# Patient Record
Sex: Female | Born: 1940 | Race: White | Hispanic: No | Marital: Married | Smoking: Former smoker
Health system: Southern US, Community
[De-identification: ages and names within clinical notes are randomized; demographics above are authoritative.]

## PROBLEM LIST (undated history)

## (undated) DIAGNOSIS — I10 Essential (primary) hypertension: Secondary | ICD-10-CM

## (undated) DIAGNOSIS — E785 Hyperlipidemia, unspecified: Secondary | ICD-10-CM

## (undated) HISTORY — DX: Essential (primary) hypertension: I10

## (undated) HISTORY — PX: EYE SURGERY: SHX253

## (undated) HISTORY — DX: Hyperlipidemia, unspecified: E78.5

---
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
## 1982-07-16 HISTORY — PX: TUBAL LIGATION: SHX77

## 1983-07-17 HISTORY — PX: TUBAL LIGATION: SHX77

## 1984-07-16 HISTORY — PX: BREAST SURGERY: SHX581

## 1986-07-16 HISTORY — PX: BREAST EXCISIONAL BIOPSY: SUR124

## 1998-02-02 ENCOUNTER — Ambulatory Visit (HOSPITAL_COMMUNITY): Admission: RE | Admit: 1998-02-02 | Discharge: 1998-02-02 | Payer: Self-pay | Admitting: Obstetrics and Gynecology

## 1998-02-14 ENCOUNTER — Other Ambulatory Visit: Admission: RE | Admit: 1998-02-14 | Discharge: 1998-02-14 | Payer: Self-pay | Admitting: Obstetrics and Gynecology

## 1999-04-25 ENCOUNTER — Other Ambulatory Visit: Admission: RE | Admit: 1999-04-25 | Discharge: 1999-04-25 | Payer: Self-pay | Admitting: *Deleted

## 1999-08-08 ENCOUNTER — Encounter: Payer: Self-pay | Admitting: Obstetrics and Gynecology

## 1999-08-08 ENCOUNTER — Ambulatory Visit (HOSPITAL_COMMUNITY): Admission: RE | Admit: 1999-08-08 | Discharge: 1999-08-08 | Payer: Self-pay | Admitting: Obstetrics and Gynecology

## 2000-04-25 ENCOUNTER — Other Ambulatory Visit: Admission: RE | Admit: 2000-04-25 | Discharge: 2000-04-25 | Payer: Self-pay | Admitting: Obstetrics and Gynecology

## 2000-09-09 ENCOUNTER — Ambulatory Visit (HOSPITAL_COMMUNITY): Admission: RE | Admit: 2000-09-09 | Discharge: 2000-09-09 | Payer: Self-pay | Admitting: Obstetrics and Gynecology

## 2000-09-09 ENCOUNTER — Encounter: Payer: Self-pay | Admitting: Obstetrics and Gynecology

## 2000-11-04 ENCOUNTER — Other Ambulatory Visit: Admission: RE | Admit: 2000-11-04 | Discharge: 2000-11-04 | Payer: Self-pay | Admitting: Obstetrics and Gynecology

## 2001-04-28 ENCOUNTER — Other Ambulatory Visit: Admission: RE | Admit: 2001-04-28 | Discharge: 2001-04-28 | Payer: Self-pay | Admitting: Obstetrics and Gynecology

## 2002-02-12 ENCOUNTER — Encounter: Payer: Self-pay | Admitting: Obstetrics and Gynecology

## 2002-02-12 ENCOUNTER — Ambulatory Visit (HOSPITAL_COMMUNITY): Admission: RE | Admit: 2002-02-12 | Discharge: 2002-02-12 | Payer: Self-pay | Admitting: Obstetrics and Gynecology

## 2002-02-18 ENCOUNTER — Encounter: Admission: RE | Admit: 2002-02-18 | Discharge: 2002-02-18 | Payer: Self-pay | Admitting: Obstetrics and Gynecology

## 2002-02-18 ENCOUNTER — Encounter: Payer: Self-pay | Admitting: Obstetrics and Gynecology

## 2002-06-19 ENCOUNTER — Other Ambulatory Visit: Admission: RE | Admit: 2002-06-19 | Discharge: 2002-06-19 | Payer: Self-pay | Admitting: Obstetrics and Gynecology

## 2002-10-29 ENCOUNTER — Encounter: Admission: RE | Admit: 2002-10-29 | Discharge: 2002-10-29 | Payer: Self-pay | Admitting: Obstetrics and Gynecology

## 2002-10-29 ENCOUNTER — Encounter: Payer: Self-pay | Admitting: Obstetrics and Gynecology

## 2003-06-21 ENCOUNTER — Other Ambulatory Visit: Admission: RE | Admit: 2003-06-21 | Discharge: 2003-06-21 | Payer: Self-pay | Admitting: Obstetrics and Gynecology

## 2004-05-03 ENCOUNTER — Encounter: Admission: RE | Admit: 2004-05-03 | Discharge: 2004-05-03 | Payer: Self-pay | Admitting: Obstetrics and Gynecology

## 2004-06-21 ENCOUNTER — Other Ambulatory Visit: Admission: RE | Admit: 2004-06-21 | Discharge: 2004-06-21 | Payer: Self-pay | Admitting: *Deleted

## 2005-07-18 ENCOUNTER — Encounter: Admission: RE | Admit: 2005-07-18 | Discharge: 2005-07-18 | Payer: Self-pay | Admitting: Obstetrics and Gynecology

## 2005-07-23 ENCOUNTER — Other Ambulatory Visit: Admission: RE | Admit: 2005-07-23 | Discharge: 2005-07-23 | Payer: Self-pay | Admitting: Obstetrics and Gynecology

## 2006-09-02 ENCOUNTER — Encounter: Admission: RE | Admit: 2006-09-02 | Discharge: 2006-09-02 | Payer: Self-pay | Admitting: Obstetrics and Gynecology

## 2006-09-17 ENCOUNTER — Encounter: Admission: RE | Admit: 2006-09-17 | Discharge: 2006-09-17 | Payer: Self-pay | Admitting: Obstetrics and Gynecology

## 2006-12-26 ENCOUNTER — Other Ambulatory Visit: Admission: RE | Admit: 2006-12-26 | Discharge: 2006-12-26 | Payer: Self-pay | Admitting: Obstetrics and Gynecology

## 2010-03-27 LAB — HM MAMMOGRAPHY: HM Mammogram: NORMAL

## 2010-08-04 ENCOUNTER — Encounter
Admission: RE | Admit: 2010-08-04 | Discharge: 2010-08-04 | Payer: Self-pay | Source: Home / Self Care | Attending: Obstetrics and Gynecology | Admitting: Obstetrics and Gynecology

## 2010-08-06 ENCOUNTER — Encounter: Payer: Self-pay | Admitting: Obstetrics and Gynecology

## 2011-01-14 HISTORY — PX: FRACTURE SURGERY: SHX138

## 2011-01-27 ENCOUNTER — Emergency Department (HOSPITAL_COMMUNITY): Payer: Medicare Other

## 2011-01-27 ENCOUNTER — Inpatient Hospital Stay (HOSPITAL_COMMUNITY)
Admission: EM | Admit: 2011-01-27 | Discharge: 2011-02-02 | DRG: 494 | Disposition: A | Discharge status: Home Health | Payer: Medicare Other | Attending: Orthopaedic Surgery | Admitting: Orthopaedic Surgery

## 2011-01-27 DIAGNOSIS — K219 Gastro-esophageal reflux disease without esophagitis: Secondary | ICD-10-CM | POA: Diagnosis present

## 2011-01-27 DIAGNOSIS — S82853A Displaced trimalleolar fracture of unspecified lower leg, initial encounter for closed fracture: Principal | ICD-10-CM | POA: Diagnosis present

## 2011-01-27 DIAGNOSIS — W010XXA Fall on same level from slipping, tripping and stumbling without subsequent striking against object, initial encounter: Secondary | ICD-10-CM | POA: Diagnosis present

## 2011-01-27 DIAGNOSIS — Y998 Other external cause status: Secondary | ICD-10-CM

## 2011-01-28 ENCOUNTER — Inpatient Hospital Stay (HOSPITAL_COMMUNITY): Payer: Medicare Other

## 2011-01-28 LAB — URINALYSIS, ROUTINE W REFLEX MICROSCOPIC
Ketones, ur: NEGATIVE mg/dL
Leukocytes, UA: NEGATIVE
Nitrite: NEGATIVE
Urobilinogen, UA: 0.2 mg/dL (ref 0.0–1.0)
pH: 7.5 (ref 5.0–8.0)

## 2011-01-28 LAB — BASIC METABOLIC PANEL
Chloride: 102 mEq/L (ref 96–112)
Creatinine, Ser: 0.56 mg/dL (ref 0.50–1.10)
GFR calc Af Amer: >60 mL/min (ref >60)
Potassium: 4 mEq/L (ref 3.5–5.1)

## 2011-01-28 LAB — CBC
MCV: 87.3 fL (ref 78.0–100.0)
Platelets: 258 10*3/uL (ref 150–400)
RDW: 12.4 % (ref 11.5–15.5)
WBC: 8.9 10*3/uL (ref 4.0–10.5)

## 2011-01-29 LAB — URINE CULTURE
Colony Count: NO GROWTH
Culture  Setup Time: 201207151715
Culture: NO GROWTH

## 2011-02-05 NOTE — Op Note (Signed)
  NAMEMADISSEN, Kara Harvey             ACCOUNT NO.:  [id number]  MEDICAL RECORD NO.:  [id number]  LOCATION:  5030                         FACILITY:  [facility]  PHYSICIAN:  Jasten Guyette C. Ophelia Harvey, M.D.    DATE OF BIRTH:  March 01, 1941  DATE OF PROCEDURE:  01/27/2011 DATE OF DISCHARGE:                              OPERATIVE REPORT   PREOPERATIVE DIAGNOSIS:  Left closed trimalleolar ankle fracture.  POSTOPERATIVE DIAGNOSIS:  Left closed trimalleolar ankle fracture.  PROCEDURE:  Bimalleolar fixation of left trimalleolar ankle fracture with fixation of medial lateral malleolus.  SURGEON:  Erminia Mcnew C. Ophelia Charter, MD  ANESTHESIA:  GOT.  TOURNIQUET TIME:  Less than an hour.  ESTIMATED BLOOD LOSS:  Minimal.  DESCRIPTION OF PROCEDURE:  After induction of general anesthesia, preoperative Ancef prophylaxis, standard prepping and draping, sterile skin marker, Betadine, Vi-Drape, proximal tourniquet, extremity sheets and drapes, stockinette, and a sterile glove over the toes.  Surgical time-out procedure was completed.  Leg was wrapped with an Esmarch, leg elevated, tourniquet inflated.  Medial incision was made first. Incision had to be altered slightly due to an abrasion.  Medially over the medial malleolus, she had some superficial varicosities and thin skin.  Branches of the saphenous vein were preserved.  Fracture was distracted, irrigated.  Medial malleolar fragment was fairly small. Lateral incision was made, subperiosteal dissection down the fibula with reduction of the fibular fragments.  There was significant comminution and the portion of the fibula that corresponded to the third and fourth holes of the Synthes plate from the inferior aspect and an anterior and posterior butterfly fragment.  Distal piece was all in one piece, but bone was soft.  Unicortical screws were placed distally, cancellous 2 x 14-mm screws, and then 12 x 14-mm bicortical screws proximally.  Lag screw was placed from anterior  proximally to distal posterior to try to hold the 2 fragments.  They were still somewhat mobile despite the screw being placed, this was a fixation, but due to the poor quality of the bone, a finger could still be placed from anterior to posterior and the midportion was comminuted and squeezed with still some mobility. Overall alignment looked good.  Attention was turned to the medial aspect and two 35-mm cancellous screws were placed reducing the fragment after holding it reduced, drilling a K-wire, and then placing the 2 screws.  Final spot pictures showed anatomic reduction of the mortise. She will be nonweightbearing due to the comminution of the fracture.  On the lateral fluoroscopic views, the posterior malleolus had significant improvement in its position and involved less than a third of the joint, so no fixation of posterior malleolus was performed.  The patient tolerated the procedure well and was transferred to the recovery room in stable condition.  After irrigation, closed with 2-0 Vicryl, skin staple closure, Marcaine infiltration.  Xeroform 4 x 4s, Webril, and short-leg splint.     Kara Harvey, M.D.     MCY/MEDQ  D:  01/28/2011  T:  01/28/2011  Job:  [id number]  Electronically Signed by Annell Greening M.D. on 02/05/2011 04:10:57 PM

## 2011-03-05 NOTE — Discharge Summary (Signed)
[name], [name]             ACCOUNT NO.:  [id number]  MEDICAL RECORD NO.:  [id number]  LOCATION:  5030                         FACILITY:  [facility]  PHYSICIAN:  Sylvester Salonga C. Ophelia Charter, M.D.    DATE OF BIRTH:  1940-09-06  DATE OF ADMISSION:  01/27/2011 DATE OF DISCHARGE:  02/02/2011                              DISCHARGE SUMMARY   ADMISSION DIAGNOSIS:  Left closed trimalleolar ankle fracture.  DISCHARGE DIAGNOSIS:  Left closed trimalleolar ankle fracture.  PROCEDURE:  On January 27, 2011, the patient underwent bimalleolar fixation of left trimalleolar ankle fracture with fixation of medial and lateral malleolus.  This was performed by Dr. Ophelia Charter under general anesthesia.  CONSULTATIONS:  None.  BRIEF HISTORY:  The patient is a 70 year old female who slipped and fell injuring her left ankle on some wet steps.  She was brought to [facility] where radiographs showed a closed left trimalleolar ankle fracture.  She was admitted for the procedure as stated above.  BRIEF HOSPITAL COURSE:  The patient tolerated the procedure under general anesthesia without complications.  Postoperatively, she had good capillary refill and sensation of her toes.  She was placed at bedrest with strict elevation.  Physical Therapy was consulted for ambulation and gait training.  The patient was very slow to progress with her activity.  It was felt she may benefit from a skilled nursing facility, but gradually she was able to progress a little better and become ambulatory so that she could return home.  The patient did have an episode of elevated temperature as high as 102.3.  This was felt to be related atelectasis and she was treated with incentive spirometry. Urine culture as of January 29, 2011, was negative.  The patient was treated with PAS to the right lower extremity and aspirin for DVT prophylaxis.  She eventually was able to complete her physical therapy which involved ambulating up  and down steps as well and was discharged on February 02, 2011, in stable condition.  At that time, she was using p.o. analgesics.  She was taking a regular diet.  She was voiding and having bowel movements without difficulty.  PERTINENT LABORATORY VALUES:  On January 28, 2011, hemoglobin 12.7, hematocrit was 37.7.  Chemistry studies on admission with glucose 111, otherwise within normal limits.  PLAN:  The patient was discharged to her home.  Arrangement was made for home health physical therapy to assist with ambulation and gait training.  She is strictly nonweightbearing to the operative extremity. She is advised to continue strict elevation when she is at rest to decrease the amount of edema of her ankle.  She is encouraged to move her toes.  She will keep her splint dry and clean at all times.  The patient is instructed to follow up with Dr. Ophelia Charter 2 weeks from the date of surgery.  She was placed on aspirin for DVT prophylaxis and instructed to use a stool softener daily while using narcotic medications.  She was given prescription for Percocet 5/325 one to two every 4-6 hours as needed for pain.  She will continue all of her other home medications as taken prior to admission.  She is advised to call  the office if she has questions or concerns prior to her return office visit.  CONDITION ON DISCHARGE:  Stable.     Wende Neighbors, P.A.   ______________________________ Veverly Fells Ophelia Charter, M.D.    SMV/MEDQ  D:  02/27/2011  T:  02/27/2011  Job:  [id number]  Electronically Signed by Maud Deed P.A. on 02/28/2011 01:02:24 PM Electronically Signed by Annell Greening M.D. on 03/05/2011 04:33:11 PM

## 2013-02-09 ENCOUNTER — Telehealth: Payer: Self-pay | Admitting: Emergency Medicine

## 2013-02-09 NOTE — Telephone Encounter (Signed)
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

## 2013-02-09 NOTE — Telephone Encounter (Signed)
Patient called stating she wanted to become a new patient to you. She stated she knew you personally and wanted to know if you would accept her into the practice.

## 2013-03-27 ENCOUNTER — Ambulatory Visit (INDEPENDENT_AMBULATORY_CARE_PROVIDER_SITE_OTHER): Payer: Medicare Other | Admitting: Internal Medicine

## 2013-03-27 ENCOUNTER — Encounter: Payer: Self-pay | Admitting: Internal Medicine

## 2013-03-27 VITALS — BP 118/72 | HR 64 | Temp 97.7°F | Resp 14 | Ht 63.0 in | Wt 167.5 lb

## 2013-03-27 DIAGNOSIS — Z1211 Encounter for screening for malignant neoplasm of colon: Secondary | ICD-10-CM

## 2013-03-27 DIAGNOSIS — I10 Essential (primary) hypertension: Secondary | ICD-10-CM

## 2013-03-27 DIAGNOSIS — N3941 Urge incontinence: Secondary | ICD-10-CM

## 2013-03-27 DIAGNOSIS — E785 Hyperlipidemia, unspecified: Secondary | ICD-10-CM

## 2013-03-27 DIAGNOSIS — Z23 Encounter for immunization: Secondary | ICD-10-CM

## 2013-03-27 DIAGNOSIS — Z1239 Encounter for other screening for malignant neoplasm of breast: Secondary | ICD-10-CM

## 2013-03-27 MED ORDER — TETANUS-DIPHTH-ACELL PERTUSSIS 5-2.5-18.5 LF-MCG/0.5 IM SUSP: 0.5000 mL | Freq: Once | INTRAMUSCULAR | Status: DC

## 2013-03-27 MED ORDER — ZOSTER VACCINE LIVE 19400 UNT/0.65ML ~~LOC~~ SOLR: 0.6500 mL | Freq: Once | SUBCUTANEOUS | Status: DC

## 2013-03-27 NOTE — Patient Instructions (Addendum)
Referral for colonoscopy pending   mammogram ordered ofr late morning /early afternoon  Make appt for early morning fasting labs and an annual physical with PAP smear

## 2013-03-27 NOTE — Progress Notes (Signed)
Patient ID: Kara Harvey, female   DOB: Dec 06, 1940, 72 y.o.   MRN: [id number]    Patient Active Problem List   Diagnosis Date Noted  . Urge incontinence 03/27/2013  . Essential hypertension, benign 03/27/2013  . Other and unspecified hyperlipidemia 03/27/2013    Subjective:  CC:   Chief Complaint  Patient presents with  . Establish Care    HPI:  Kara Harvey is a 72 y.o. female who presents as a new patient to establish primary care with the chief complaint of  Need for primary care,  She is a Former pt of Lacie Scotts, who shetolerated because he was was also her father's physician of many years , but her father passed in 2023-01-12 Father died at 46 in 01/12/2023 in hospice after suffering multiple strokes which left him aphasic.   1) Weight gain:  She Fractured her left ankleseveal years ago and undewent  fixed trimalleolar repair with plates and screws by Dr.  Ophelia Charter.  She has ained weight  Since she broke it bc she is not walking anymore,   2) HTN: she has not taken medications for hypertension for over a year.   3) Hyperlipidemia:  She has not taken statins for years either    4) HM:  No prior colonoscopy EVER.  Bowels move regularly,  No history of BRBPR and no FH of colon. CA  Backed out of colonoscopy by Buccini bc of the prep.    5) Needs pap SMEAR.    6) Sees Diona Browner for annual derm checkup.  No history of skin cancer.  She has had prior screening for osteoporosis.  DEXA scans done in [place]      Past Medical History  Diagnosis Date  . Hyperlipidemia   . Hypertension     Past Surgical History  Procedure Laterality Date  . Fracture surgery Left 01/14/2011    ankle  . Tonsilectomy, adenoidectomy, bilateral myringotomy and tubes Bilateral 1957    does not believe adenoids were removed  . Tubal ligation  y-37  . Breast surgery Left 1986    for false positive    Family History  Problem Relation Age of Onset  . Hyperlipidemia Mother   . Cancer Mother   .  Arthritis Father   . Alzheimer's disease Father     History   Social History  . Marital Status: Married    Spouse Name: N/A    Number of Children: N/A  . Years of Education: N/A   Occupational History  . Not on file.   Social History Main Topics  . Smoking status: Former Smoker -- 2.00 packs/day    Types: Cigarettes    Quit date: 03/28/1979  . Smokeless tobacco: Never Used  . Alcohol Use: 3.5 oz/week    7 drink(s) per week  . Drug Use: No  . Sexual Activity: Yes   Other Topics Concern  . Not on file   Social History Narrative  . No narrative on file    Allergies  Allergen Reactions  . Sulfa Antibiotics Rash      Review of Systems:   The remainder of the review of systems was negative except those addressed in the HPI.    Objective:  BP 118/72  Pulse 64  Temp(Src) 97.7 F (36.5 C) (Oral)  Resp 14  Ht 5\' 3"  (1.6 m)  Wt 167 lb 8 oz (75.978 kg)  BMI 29.68 kg/m2  SpO2 98%  General appearance: alert, cooperative and appears stated age Ears:  normal TM's and external ear canals both ears Throat: lips, mucosa, and tongue normal; teeth and gums normal Neck: no adenopathy, no carotid bruit, supple, symmetrical, trachea midline and thyroid not enlarged, symmetric, no tenderness/mass/nodules Back: symmetric, no curvature. ROM normal. No CVA tenderness. Lungs: clear to auscultation bilaterally Heart: regular rate and rhythm, S1, S2 normal, no murmur, click, rub or gallop Abdomen: soft, non-tender; bowel sounds normal; no masses,  no organomegaly Pulses: 2+ and symmetric Skin: Skin color, texture, turgor normal. No rashes or lesions Lymph nodes: Cervical, supraclavicular, and axillary nodes normal.  Assessment and Plan:  Essential hypertension, benign Currently not in need of medication .  Prior elevations apparently were brought on by family stressors.   Other and unspecified hyperlipidemia She was previously treated with crestor but has not taken  medication in over 2 years due to concerns about side effects.  Will return for fasting lipids.  She has no personal RFs for CAD currently other than post menopausal status.   Urge incontinence Mild. Chronic,  Recommended Kegel exercises.    Updated Medication List Outpatient Encounter Prescriptions as of 03/27/2013  Medication Sig Dispense Refill  . rosuvastatin (CRESTOR) 20 MG tablet Take 20 mg by mouth daily.      . TDaP (BOOSTRIX) 5-2.5-18.5 LF-MCG/0.5 injection Inject 0.5 mLs into the muscle once.  0.5 mL  0  . zoster vaccine live, PF, (ZOSTAVAX) 16109 UNT/0.65ML injection Inject 19,400 Units into the skin once.  1 each  0  . [DISCONTINUED] lisinopril (PRINIVIL,ZESTRIL) 10 MG tablet Take 10 mg by mouth daily.       No facility-administered encounter medications on file as of 03/27/2013.     Orders Placed This Encounter  Procedures  . HM MAMMOGRAPHY  . MM Digital Screening  . Flu Vaccine QUAD 36+ mos PF IM (Fluarix)  . Ambulatory referral to Gastroenterology    No Follow-up on file.

## 2013-03-29 ENCOUNTER — Encounter: Payer: Self-pay | Admitting: Internal Medicine

## 2013-03-29 NOTE — Assessment & Plan Note (Signed)
Mild. Chronic,  Recommended Kegel exercises.

## 2013-03-29 NOTE — Assessment & Plan Note (Signed)
Currently not in need of medication .  Prior elevations apparently were brought on by family stressors.

## 2013-03-29 NOTE — Assessment & Plan Note (Addendum)
She was previously treated with crestor but has not taken medication in over 2 years due to concerns about side effects.  Will return for fasting lipids.  She has no personal RFs for CAD currently other than post menopausal status.

## 2013-04-05 LAB — HM MAMMOGRAPHY: HM Mammogram: NORMAL

## 2013-04-22 ENCOUNTER — Ambulatory Visit
Admission: RE | Admit: 2013-04-22 | Discharge: 2013-04-22 | Disposition: A | Discharge status: Home / Self Care | Payer: Medicare Other | Source: Ambulatory Visit | Attending: Internal Medicine | Admitting: Internal Medicine

## 2013-04-22 DIAGNOSIS — Z1239 Encounter for other screening for malignant neoplasm of breast: Secondary | ICD-10-CM

## 2013-05-26 ENCOUNTER — Ambulatory Visit (AMBULATORY_SURGERY_CENTER): Payer: Medicare Other

## 2013-05-26 VITALS — Ht 63.0 in | Wt 170.0 lb

## 2013-05-26 DIAGNOSIS — Z1211 Encounter for screening for malignant neoplasm of colon: Secondary | ICD-10-CM

## 2013-05-26 MED ORDER — MOVIPREP 100 G PO SOLR: ORAL | Status: DC

## 2013-05-29 ENCOUNTER — Encounter: Payer: Self-pay | Admitting: Internal Medicine

## 2013-06-05 LAB — HM COLONOSCOPY: HM Colonoscopy: NORMAL

## 2013-06-09 ENCOUNTER — Ambulatory Visit (AMBULATORY_SURGERY_CENTER): Payer: Medicare Other | Admitting: Internal Medicine

## 2013-06-09 ENCOUNTER — Encounter: Payer: Self-pay | Admitting: Internal Medicine

## 2013-06-09 VITALS — BP 151/72 | HR 81 | Temp 96.7°F | Resp 12 | Ht 63.0 in | Wt 170.0 lb

## 2013-06-09 DIAGNOSIS — Z1211 Encounter for screening for malignant neoplasm of colon: Secondary | ICD-10-CM

## 2013-06-09 DIAGNOSIS — D126 Benign neoplasm of colon, unspecified: Secondary | ICD-10-CM

## 2013-06-09 MED ORDER — SODIUM CHLORIDE 0.9 % IV SOLN: 500.0000 mL | INTRAVENOUS | Status: DC

## 2013-06-09 NOTE — Op Note (Signed)
[facility] [address] [place], [address]   COLONOSCOPY PROCEDURE REPORT  PATIENT: Kara, Harvey  MR#: [id number] BIRTHDATE: 06/13/1941 , 72  yrs. old GENDER: Female ENDOSCOPIST: Beverley Fiedler, MD REFERRED WN:[id number] Darrick Huntsman, M.D. PROCEDURE DATE:  06/09/2013 PROCEDURE:   Colonoscopy with cold biopsy polypectomy and Colonoscopy, incomplete First Screening Colonoscopy - Avg.  risk and is 50 yrs.  old or older Yes.  Prior Negative Screening - Now for repeat screening. N/A  History of Adenoma - Now for follow-up colonoscopy & has been > or = to 3 yrs.  N/A  Polyps Removed Today? Yes. ASA CLASS:   Class II INDICATIONS:average risk screening and first colonoscopy. MEDICATIONS: MAC sedation, administered by CRNA and propofol (Diprivan) 300mg  IV  DESCRIPTION OF PROCEDURE:   After the risks benefits and alternatives of the procedure were thoroughly explained, informed consent was obtained.  A digital rectal exam revealed no rectal mass.   The LB PFC-H190 [id number]  endoscope was introduced through the anus and advanced to the sigmoid colon. No adverse events experienced.   The quality of the prep was Moviprep fair  The instrument was then slowly withdrawn as the colon was fully examined.   COLON FINDINGS: There was severe diverticulosis noted in the sigmoid colon with associated tortuosity, angulation and muscular hypertrophy.  Despite the use of the pediatric colonoscope, manual pressure and position changes the scope could not be advanced through the sigmoid.  A sessile polyp measuring 5 mm in size was found in the sigmoid colon.  A polypectomy was performed with a cold snare.  The resection was complete and the polyp tissue was not retrieved.  Retroflexed views revealed no abnormalities. The scope was withdrawn and the procedure completed. COMPLICATIONS: There were no complications.  ENDOSCOPIC IMPRESSION: 1.   There was severe diverticulosis noted in the  sigmoid colon; incomplete colonoscopy 2.   Sessile polyp measuring 5 mm in size was found in the sigmoid colon; polypectomy was performed with a cold snare  RECOMMENDATIONS: 1.  Incomplete colonoscopy due to sigmoid diverticulosis and sharp angulation.  CT colonoscopy to clear unexamined portions of the colon. 2.  High fiber diet   eSigned:  Beverley Fiedler, MD 06/09/2013 11:20 AM   cc: The Patient and Duncan Dull, MD

## 2013-06-09 NOTE — Progress Notes (Signed)
Called to room to assist during endoscopic procedure.  Patient ID and intended procedure confirmed with present staff. Received instructions for my participation in the procedure from the performing physician.  Polyp wasn't retrieved

## 2013-06-09 NOTE — Patient Instructions (Signed)

## 2013-06-09 NOTE — Progress Notes (Signed)
Patient did not experience any of the following events: a burn prior to discharge; a fall within the facility; wrong site/side/patient/procedure/implant event; or a hospital transfer or hospital admission upon discharge from the facility. (G8907) Patient did not have preoperative order for IV antibiotic SSI prophylaxis. (G8918)  

## 2013-06-10 ENCOUNTER — Other Ambulatory Visit (HOSPITAL_COMMUNITY): Payer: Self-pay | Admitting: Diagnostic Radiology

## 2013-06-10 ENCOUNTER — Telehealth: Payer: Self-pay | Admitting: *Deleted

## 2013-06-10 DIAGNOSIS — R933 Abnormal findings on diagnostic imaging of other parts of digestive tract: Secondary | ICD-10-CM

## 2013-06-10 DIAGNOSIS — K5730 Diverticulosis of large intestine without perforation or abscess without bleeding: Secondary | ICD-10-CM

## 2013-06-10 DIAGNOSIS — Z1211 Encounter for screening for malignant neoplasm of colon: Secondary | ICD-10-CM

## 2013-06-10 DIAGNOSIS — K562 Volvulus: Secondary | ICD-10-CM

## 2013-06-10 NOTE — Telephone Encounter (Signed)
Alvino Chapel at [facility] call to question whether CT was screening or diagnostic. Due to the colon being tortuous with angulation and having muscular hypertrophy, as well as diverticulosis, the CT Colon should be diagnostic.

## 2013-06-10 NOTE — Telephone Encounter (Signed)
lmom for pt to call back

## 2013-06-10 NOTE — Telephone Encounter (Signed)
Scan is due to incomplete colonoscopy which was done for SCREENING

## 2013-06-10 NOTE — Addendum Note (Signed)
Addended by: Florene Glen on: 06/10/2013 12:54 PM   Modules accepted: Orders

## 2013-06-10 NOTE — Telephone Encounter (Signed)
  Follow up Call-  Call back number 06/09/2013  Post procedure Call Back phone  # [phone]  Permission to leave phone message Yes     Patient questions:  Do you have a fever, pain , or abdominal swelling? no Pain Score  0 *  Have you tolerated food without any problems? yes  Have you been able to return to your normal activities? yes  Do you have any questions about your discharge instructions: Diet   no Medications  no Follow up visit  no  Do you have questions or concerns about your Care? no  Actions: * If pain score is 4 or above: No action needed, pain <4.   Patient wants to know where and when her CT colonoscopy appointment will be made.  Informed patient that I would forward this information to Dr Lauro Franklin nurse for further follow-up today.

## 2013-06-10 NOTE — Telephone Encounter (Signed)
Informed pt of CT Colon with date and she will r/s. I had informed her insurance usually doesn't pay, but we will send a prior auth and I will let her know. Pt stated understanding.

## 2013-06-15 NOTE — Telephone Encounter (Signed)
Left a message for Alvino Chapel at [facility] with Dr. Lauro Franklin answer about CT. Left my number if she has any questions.

## 2013-06-17 ENCOUNTER — Other Ambulatory Visit: Payer: Medicare Other

## 2013-06-22 ENCOUNTER — Other Ambulatory Visit: Payer: Medicare Other

## 2013-07-01 ENCOUNTER — Ambulatory Visit
Admission: RE | Admit: 2013-07-01 | Discharge: 2013-07-01 | Disposition: A | Discharge status: Home / Self Care | Payer: Medicare Other | Source: Ambulatory Visit | Attending: Internal Medicine | Admitting: Internal Medicine

## 2013-07-01 DIAGNOSIS — Z1211 Encounter for screening for malignant neoplasm of colon: Secondary | ICD-10-CM

## 2013-07-01 DIAGNOSIS — K562 Volvulus: Secondary | ICD-10-CM

## 2013-07-01 DIAGNOSIS — K5730 Diverticulosis of large intestine without perforation or abscess without bleeding: Secondary | ICD-10-CM

## 2013-07-01 DIAGNOSIS — R933 Abnormal findings on diagnostic imaging of other parts of digestive tract: Secondary | ICD-10-CM

## 2013-08-12 ENCOUNTER — Telehealth: Payer: Self-pay | Admitting: Internal Medicine

## 2013-08-12 ENCOUNTER — Ambulatory Visit (INDEPENDENT_AMBULATORY_CARE_PROVIDER_SITE_OTHER): Payer: Medicare Other | Admitting: *Deleted

## 2013-08-12 VITALS — BP 179/101

## 2013-08-12 DIAGNOSIS — E559 Vitamin D deficiency, unspecified: Secondary | ICD-10-CM

## 2013-08-12 DIAGNOSIS — Z79899 Other long term (current) drug therapy: Secondary | ICD-10-CM

## 2013-08-12 DIAGNOSIS — I10 Essential (primary) hypertension: Secondary | ICD-10-CM

## 2013-08-12 DIAGNOSIS — E785 Hyperlipidemia, unspecified: Secondary | ICD-10-CM

## 2013-08-12 MED ORDER — LISINOPRIL-HYDROCHLOROTHIAZIDE 10-12.5 MG PO TABS: 1.0000 | ORAL_TABLET | Freq: Every day | ORAL | Status: DC

## 2013-08-12 NOTE — Telephone Encounter (Signed)
We will resume lisinopril.  rx for lisinopril/hctz , 1 tablet daily in the morning,  sent to [facility].. Please make her an appt for fasting labs in 1 week and a follow up bp visit with me

## 2013-08-12 NOTE — Telephone Encounter (Signed)
The patient use to take Lisinopril 10 mg . She was prescribed this medication by an old physician. l

## 2013-08-12 NOTE — Telephone Encounter (Signed)
Patient informed of Dr. Derrel Nip instructions and appointment scheduled.

## 2013-08-12 NOTE — Telephone Encounter (Signed)
When patient BP was checked in clinic it was 165/91 left arm and 179/101 right arm. Informed patient I would send this message to Dr. Derrel Nip and she would receive a return call from the office. This is a relatively new patient to Dr. Derrel Nip and per patient this only her second time coming to the office. She also stated she was on blood pressure medication in the past but stopped taking it because she felt like she no longer needed it.

## 2013-08-12 NOTE — Telephone Encounter (Signed)
Pt came in today wanting to get her bp check.  She stated that she took her bp @ home  About 2 hours ago  It was 155/77  She also took it at [facility] a few minutes ago it was 188/88 Pt stated she took 10 mg of bp med wasn't for sure the name of the med.  She stated she hadn't taken bp meds in years.  She also stated she chewed 1 aspirn.  Pt said she felt light headed yesterday. Spoke with carol and she said put her on nurse visit to get bp checked

## 2013-08-17 ENCOUNTER — Telehealth: Payer: Self-pay | Admitting: Internal Medicine

## 2013-08-17 NOTE — Telephone Encounter (Signed)
Relevant patient education mailed to patient.  

## 2013-08-20 ENCOUNTER — Ambulatory Visit: Payer: Medicare Other | Admitting: Internal Medicine

## 2013-09-03 ENCOUNTER — Encounter: Payer: Self-pay | Admitting: Internal Medicine

## 2013-09-03 ENCOUNTER — Ambulatory Visit (INDEPENDENT_AMBULATORY_CARE_PROVIDER_SITE_OTHER): Payer: Medicare Other | Admitting: Internal Medicine

## 2013-09-03 VITALS — BP 138/88 | HR 71 | Temp 97.5°F | Resp 16 | Wt 171.8 lb

## 2013-09-03 DIAGNOSIS — Z23 Encounter for immunization: Secondary | ICD-10-CM

## 2013-09-03 DIAGNOSIS — Z79899 Other long term (current) drug therapy: Secondary | ICD-10-CM

## 2013-09-03 DIAGNOSIS — E785 Hyperlipidemia, unspecified: Secondary | ICD-10-CM

## 2013-09-03 DIAGNOSIS — I10 Essential (primary) hypertension: Secondary | ICD-10-CM

## 2013-09-03 DIAGNOSIS — E559 Vitamin D deficiency, unspecified: Secondary | ICD-10-CM

## 2013-09-03 DIAGNOSIS — E669 Obesity, unspecified: Secondary | ICD-10-CM

## 2013-09-03 LAB — MICROALBUMIN / CREATININE URINE RATIO
Creatinine,U: 39 mg/dL
Microalb Creat Ratio: 0.3 mg/g (ref 0.0–30.0)
Microalb, Ur: 0.1 mg/dL (ref 0.0–1.9)

## 2013-09-03 LAB — COMPREHENSIVE METABOLIC PANEL
ALT: 24 U/L (ref 0–35)
AST: 25 U/L (ref 0–37)
Albumin: 4.1 g/dL (ref 3.5–5.2)
Alkaline Phosphatase: 67 U/L (ref 39–117)
BUN: 16 mg/dL (ref 6–23)
CALCIUM: 9.6 mg/dL (ref 8.4–10.5)
CO2: 25 mEq/L (ref 19–32)
CREATININE: 0.7 mg/dL (ref 0.4–1.2)
Chloride: 99 mEq/L (ref 96–112)
GFR: 90.62 mL/min (ref >60.00)
Glucose, Bld: 86 mg/dL (ref 70–99)
Potassium: 4.1 mEq/L (ref 3.5–5.1)
Sodium: 135 mEq/L (ref 135–145)
Total Bilirubin: 0.5 mg/dL (ref 0.3–1.2)
Total Protein: 7 g/dL (ref 6.0–8.3)

## 2013-09-03 LAB — LIPID PANEL
Cholesterol: 240 mg/dL — ABNORMAL HIGH (ref 0–200)
HDL: 84.5 mg/dL (ref >39.00)
TRIGLYCERIDES: 94 mg/dL (ref 0.0–149.0)
Total CHOL/HDL Ratio: 3
VLDL: 18.8 mg/dL (ref 0.0–40.0)

## 2013-09-03 LAB — LDL CHOLESTEROL, DIRECT: Direct LDL: 135.1 mg/dL

## 2013-09-03 LAB — TSH: TSH: 2.9 u[IU]/mL (ref 0.35–5.50)

## 2013-09-03 NOTE — Progress Notes (Signed)
Pre-visit discussion using our clinic review tool. No additional management support is needed unless otherwise documented below in the visit note.  

## 2013-09-03 NOTE — Patient Instructions (Signed)
You are doing well.  We will contact you with the results of your labs in a few days   I would like you to lose a little weight before your next visit (to get your BMI < 30 ,  5 lbs is all you need!) to avoid the threat of the "obesity" tax that some insurance companies are starting to apply  See you in 6 months for your physical

## 2013-09-03 NOTE — Progress Notes (Signed)
Patient ID: Kara Harvey, female   DOB: 27-Jun-1941, 73 y.o.   MRN: [id number]  Patient Active Problem List   Diagnosis Date Noted  . Obesity, unspecified 09/05/2013  . Urge incontinence 03/27/2013  . Essential hypertension, benign 03/27/2013  . Other and unspecified hyperlipidemia 03/27/2013    Subjective:  CC:   Chief Complaint  Patient presents with  . Follow-up    labs and BP    HPI:   Kara Harvey is a 73 y.o. female who presents for  For follow up on chronic conditions including hypertension and obesity.  Her blood pressure has improved with resuming daily medication and she is tolerating it well.  She is taking meds at night  And has only occasional nocturia.  Denies muscle cramps and cough.   In the interim she has had breast and  colon cancer screening.   Ended up getting a CT of colon done due to incomplete colonoscopy.  She is moving  Her bowels on a regular basis with daily metamucil.   Mammogram was normal.    Past Medical History  Diagnosis Date  . Hyperlipidemia   . Hypertension     Past Surgical History  Procedure Laterality Date  . Fracture surgery Left 01/14/2011    ankle  . Tonsilectomy, adenoidectomy, bilateral myringotomy and tubes Bilateral 1957    does not believe adenoids were removed  . Tubal ligation  y-37  . Breast surgery Left 1986    for false positive/biopsy       The following portions of the patient's history were reviewed and updated as appropriate: Allergies, current medications, and problem list.    Review of Systems:   Patient denies headache, fevers, malaise, unintentional weight loss, skin rash, eye pain, sinus congestion and sinus pain, sore throat, dysphagia,  hemoptysis , cough, dyspnea, wheezing, chest pain, palpitations, orthopnea, edema, abdominal pain, nausea, melena, diarrhea, constipation, flank pain, dysuria, hematuria, urinary  Frequency, nocturia, numbness, tingling, seizures,  Focal weakness, Loss of  consciousness,  Tremor, insomnia, depression, anxiety, and suicidal ideation.     History   Social History  . Marital Status: Married    Spouse Name: N/A    Number of Children: N/A  . Years of Education: N/A   Occupational History  . Not on file.   Social History Main Topics  . Smoking status: Former Smoker -- 2.00 packs/day    Types: Cigarettes    Quit date: 03/28/1979  . Smokeless tobacco: Never Used  . Alcohol Use: 3.0 oz/week    5 Glasses of wine per week  . Drug Use: No  . Sexual Activity: Yes   Other Topics Concern  . Not on file   Social History Narrative  . No narrative on file    Objective:  Filed Vitals:   09/03/13 0902  BP: 138/88  Pulse: 71  Temp: 97.5 F (36.4 C)  Resp: 16     General appearance: alert, cooperative and appears stated age Ears: normal TM's and external ear canals both ears Throat: lips, mucosa, and tongue normal; teeth and gums normal Neck: no adenopathy, no carotid bruit, supple, symmetrical, trachea midline and thyroid not enlarged, symmetric, no tenderness/mass/nodules Back: symmetric, no curvature. ROM normal. No CVA tenderness. Lungs: clear to auscultation bilaterally Heart: regular rate and rhythm, S1, S2 normal, no murmur, click, rub or gallop Abdomen: soft, non-tender; bowel sounds normal; no masses,  no organomegaly Pulses: 2+ and symmetric Skin: Skin color, texture, turgor normal. No rashes or lesions Lymph  nodes: Cervical, supraclavicular, and axillary nodes normal.  Assessment and Plan:  Essential hypertension, benign Improved with addition of lisinopril/hctz.  Tolerating dosing at bedtime per patient preference.  Renal function and lytes are normal. Lab Results  Component Value Date   CREATININE 0.7 09/03/2013   Lab Results  Component Value Date   NA 135 09/03/2013   K 4.1 09/03/2013   CL 99 09/03/2013   CO2 25 09/03/2013      Other and unspecified hyperlipidemia Fasting lipids are notable for hfl 85 and LDL  135.(LDL was 106 in 2013)  Previous treatment with crestor but was stopped by patient.  10 yr risk of CAD using framingham risk calculator is 15%.  Will recommend baby aspirn and resuming therapy with generic lipitor  Obesity, unspecified I have addressed  BMI and recommended a low glycemic index diet utilizing smaller more frequent meals to increase metabolism.  I have also recommended that patient start exercising with a goal of 30 minutes of aerobic exercise a minimum of 5 days per week. Screening for lipid disorders, thyroid and diabetes to be done today.     Updated Medication List Outpatient Encounter Prescriptions as of 09/03/2013  Medication Sig  . lisinopril-hydrochlorothiazide (PRINZIDE,ZESTORETIC) 10-12.5 MG per tablet Take 1 tablet by mouth daily.  . [DISCONTINUED] zoster vaccine live, PF, (ZOSTAVAX) 33354 UNT/0.65ML injection Inject 19,400 Units into the skin once.  Marland Kitchen atorvastatin (LIPITOR) 20 MG tablet Take 1 tablet (20 mg total) by mouth daily.  . TDaP (BOOSTRIX) 5-2.5-18.5 LF-MCG/0.5 injection Inject 0.5 mLs into the muscle once.  . [DISCONTINUED] rosuvastatin (CRESTOR) 20 MG tablet Take 20 mg by mouth daily.     Orders Placed This Encounter  Procedures  . HM MAMMOGRAPHY  . Pneumococcal conjugate vaccine 13-valent  . Microalbumin / creatinine urine ratio  . LDL cholesterol, direct  . HM COLONOSCOPY    No Follow-up on file.

## 2013-09-04 ENCOUNTER — Telehealth: Payer: Self-pay | Admitting: Internal Medicine

## 2013-09-04 LAB — VITAMIN D 25 HYDROXY (VIT D DEFICIENCY, FRACTURES): VIT D 25 HYDROXY: 39 ng/mL (ref 30–89)

## 2013-09-04 NOTE — Telephone Encounter (Signed)
Relevant patient education mailed to patient.  

## 2013-09-05 ENCOUNTER — Encounter: Payer: Self-pay | Admitting: Internal Medicine

## 2013-09-05 DIAGNOSIS — E663 Overweight: Secondary | ICD-10-CM | POA: Insufficient documentation

## 2013-09-05 MED ORDER — ATORVASTATIN CALCIUM 20 MG PO TABS: 20.0000 mg | ORAL_TABLET | Freq: Every day | ORAL | Status: DC

## 2013-09-05 NOTE — Assessment & Plan Note (Signed)
I have addressed  BMI and recommended a low glycemic index diet utilizing smaller more frequent meals to increase metabolism.  I have also recommended that patient start exercising with a goal of 30 minutes of aerobic exercise a minimum of 5 days per week. Screening for lipid disorders, thyroid and diabetes to be done today.   

## 2013-09-05 NOTE — Assessment & Plan Note (Addendum)
Fasting lipids are notable for hfl 85 and LDL 135.(LDL was 106 in 2013)  Previous treatment with crestor but was stopped by patient.  10 yr risk of CAD using framingham risk calculator is 15%.  Will recommend baby aspirn and resuming therapy with generic lipitor

## 2013-09-05 NOTE — Assessment & Plan Note (Addendum)
Improved with addition of lisinopril/hctz.  Tolerating dosing at bedtime per patient preference.  Renal function and lytes are normal. Lab Results  Component Value Date   CREATININE 0.7 09/03/2013   Lab Results  Component Value Date   NA 135 09/03/2013   K 4.1 09/03/2013   CL 99 09/03/2013   CO2 25 09/03/2013

## 2013-10-13 ENCOUNTER — Telehealth: Payer: Self-pay | Admitting: *Deleted

## 2013-10-13 DIAGNOSIS — E785 Hyperlipidemia, unspecified: Secondary | ICD-10-CM

## 2013-10-13 DIAGNOSIS — Z79899 Other long term (current) drug therapy: Secondary | ICD-10-CM

## 2013-10-13 NOTE — Telephone Encounter (Signed)
Pt is coming in tomorrow what labs and dX? 

## 2013-10-14 ENCOUNTER — Other Ambulatory Visit (INDEPENDENT_AMBULATORY_CARE_PROVIDER_SITE_OTHER): Payer: Medicare Other

## 2013-10-14 DIAGNOSIS — Z79899 Other long term (current) drug therapy: Secondary | ICD-10-CM

## 2013-10-14 DIAGNOSIS — E785 Hyperlipidemia, unspecified: Secondary | ICD-10-CM

## 2013-10-14 LAB — COMPREHENSIVE METABOLIC PANEL
ALK PHOS: 59 U/L (ref 39–117)
ALT: 22 U/L (ref 0–35)
AST: 20 U/L (ref 0–37)
Albumin: 4.2 g/dL (ref 3.5–5.2)
BILIRUBIN TOTAL: 0.7 mg/dL (ref 0.3–1.2)
BUN: 16 mg/dL (ref 6–23)
CO2: 28 mEq/L (ref 19–32)
Calcium: 9.3 mg/dL (ref 8.4–10.5)
Chloride: 99 mEq/L (ref 96–112)
Creatinine, Ser: 0.8 mg/dL (ref 0.4–1.2)
GFR: 74.03 mL/min (ref >60.00)
Glucose, Bld: 101 mg/dL — ABNORMAL HIGH (ref 70–99)
Potassium: 4 mEq/L (ref 3.5–5.1)
Sodium: 135 mEq/L (ref 135–145)
TOTAL PROTEIN: 6.9 g/dL (ref 6.0–8.3)

## 2013-10-14 LAB — LIPID PANEL
CHOL/HDL RATIO: 2
Cholesterol: 181 mg/dL (ref 0–200)
HDL: 74.2 mg/dL (ref >39.00)
LDL Cholesterol: 87 mg/dL (ref 0–99)
Triglycerides: 100 mg/dL (ref 0.0–149.0)
VLDL: 20 mg/dL (ref 0.0–40.0)

## 2013-10-15 ENCOUNTER — Encounter: Payer: Self-pay | Admitting: *Deleted

## 2013-12-17 ENCOUNTER — Other Ambulatory Visit: Payer: Self-pay | Admitting: Internal Medicine

## 2013-12-18 ENCOUNTER — Ambulatory Visit: Payer: Self-pay | Admitting: Ophthalmology

## 2013-12-18 LAB — POTASSIUM: POTASSIUM: 3.6 mmol/L (ref 3.5–5.1)

## 2013-12-29 ENCOUNTER — Ambulatory Visit: Payer: Self-pay | Admitting: Ophthalmology

## 2014-03-01 ENCOUNTER — Other Ambulatory Visit (INDEPENDENT_AMBULATORY_CARE_PROVIDER_SITE_OTHER): Payer: Medicare Other

## 2014-03-01 ENCOUNTER — Telehealth: Payer: Self-pay | Admitting: *Deleted

## 2014-03-01 DIAGNOSIS — Z79899 Other long term (current) drug therapy: Secondary | ICD-10-CM

## 2014-03-01 DIAGNOSIS — E785 Hyperlipidemia, unspecified: Secondary | ICD-10-CM

## 2014-03-01 LAB — COMPREHENSIVE METABOLIC PANEL WITH GFR
ALT: 20 U/L (ref 0–35)
AST: 20 U/L (ref 0–37)
Albumin: 3.9 g/dL (ref 3.5–5.2)
Alkaline Phosphatase: 65 U/L (ref 39–117)
BUN: 16 mg/dL (ref 6–23)
CO2: 28 meq/L (ref 19–32)
Calcium: 9.4 mg/dL (ref 8.4–10.5)
Chloride: 101 meq/L (ref 96–112)
Creatinine, Ser: 0.8 mg/dL (ref 0.4–1.2)
GFR: 76.12 mL/min
Glucose, Bld: 102 mg/dL — ABNORMAL HIGH (ref 70–99)
Potassium: 4 meq/L (ref 3.5–5.1)
Sodium: 138 meq/L (ref 135–145)
Total Bilirubin: 0.6 mg/dL (ref 0.2–1.2)
Total Protein: 6.5 g/dL (ref 6.0–8.3)

## 2014-03-01 LAB — LIPID PANEL
Cholesterol: 175 mg/dL (ref 0–200)
HDL: 80.5 mg/dL
LDL Cholesterol: 83 mg/dL (ref 0–99)
NonHDL: 94.5
Total CHOL/HDL Ratio: 2
Triglycerides: 58 mg/dL (ref 0.0–149.0)
VLDL: 11.6 mg/dL (ref 0.0–40.0)

## 2014-03-01 NOTE — Telephone Encounter (Signed)
What labs and dx?  

## 2014-03-04 ENCOUNTER — Encounter: Payer: Medicare Other | Admitting: Internal Medicine

## 2014-03-04 ENCOUNTER — Ambulatory Visit (INDEPENDENT_AMBULATORY_CARE_PROVIDER_SITE_OTHER): Payer: Medicare Other | Admitting: Internal Medicine

## 2014-03-04 ENCOUNTER — Encounter: Payer: Self-pay | Admitting: Internal Medicine

## 2014-03-04 VITALS — BP 134/76 | HR 73 | Temp 98.0°F | Resp 14 | Ht 63.0 in | Wt 178.8 lb

## 2014-03-04 DIAGNOSIS — Z9842 Cataract extraction status, left eye: Secondary | ICD-10-CM

## 2014-03-04 DIAGNOSIS — S82899D Other fracture of unspecified lower leg, subsequent encounter for closed fracture with routine healing: Secondary | ICD-10-CM | POA: Insufficient documentation

## 2014-03-04 DIAGNOSIS — S82892D Other fracture of left lower leg, subsequent encounter for closed fracture with routine healing: Secondary | ICD-10-CM

## 2014-03-04 DIAGNOSIS — Z0000 Encounter for general adult medical examination without abnormal findings: Secondary | ICD-10-CM

## 2014-03-04 DIAGNOSIS — Z1239 Encounter for other screening for malignant neoplasm of breast: Secondary | ICD-10-CM

## 2014-03-04 DIAGNOSIS — Z13820 Encounter for screening for osteoporosis: Secondary | ICD-10-CM

## 2014-03-04 DIAGNOSIS — Z872 Personal history of diseases of the skin and subcutaneous tissue: Secondary | ICD-10-CM

## 2014-03-04 DIAGNOSIS — Z9849 Cataract extraction status, unspecified eye: Secondary | ICD-10-CM

## 2014-03-04 DIAGNOSIS — E669 Obesity, unspecified: Secondary | ICD-10-CM

## 2014-03-04 DIAGNOSIS — E785 Hyperlipidemia, unspecified: Secondary | ICD-10-CM

## 2014-03-04 DIAGNOSIS — I10 Essential (primary) hypertension: Secondary | ICD-10-CM

## 2014-03-04 DIAGNOSIS — IMO0001 Reserved for inherently not codable concepts without codable children: Secondary | ICD-10-CM

## 2014-03-04 HISTORY — DX: Other fracture of unspecified lower leg, subsequent encounter for closed fracture with routine healing: S82.899D

## 2014-03-04 MED ORDER — BUPROPION HCL ER (SR) 150 MG PO TB12: 150.0000 mg | ORAL_TABLET | Freq: Two times a day (BID) | ORAL | Status: DC

## 2014-03-04 MED ORDER — VALACYCLOVIR HCL 1 G PO TABS: 1000.0000 mg | ORAL_TABLET | Freq: Two times a day (BID) | ORAL | Status: DC

## 2014-03-04 NOTE — Patient Instructions (Signed)
You had your annual Medicare wellness exam today  We will schedule your mammogram and bone density test soon.  Your cholesterol, liver and kidney function are normal.  You do not need any medication changes. Please plan to repeat the labs in 6 months.    I recommend getting the majority of your calcium and Vitamin D  through diet rather than supplements given the recent association of calcium supplements with increased coronary artery calcium scores (You need 1200 mg daily and 1000 units of D3 daily  )   Unsweetened almond/coconut milk is a great low calorie low carb, cholesterol free  way to increase your dietary calcium and vitamin D.  Try the blue Jackquline Bosch  Cain Saupe is the Physiological scientist at the [employer] who I recommend.  I will get you his phone number.  Health Maintenance Adopting a healthy lifestyle and getting preventive care can go a long way to promote health and wellness. Talk with your health care provider about what schedule of regular examinations is right for you. This is a good chance for you to check in with your provider about disease prevention and staying healthy. In between checkups, there are plenty of things you can do on your own. Experts have done a lot of research about which lifestyle changes and preventive measures are most likely to keep you healthy. Ask your health care provider for more information. WEIGHT AND DIET  Eat a healthy diet  Be sure to include plenty of vegetables, fruits, low-fat dairy products, and lean protein.  Do not eat a lot of foods high in solid fats, added sugars, or salt.  Get regular exercise. This is one of the most important things you can do for your health.  Most adults should exercise for at least 150 minutes each week. The exercise should increase your heart rate and make you sweat (moderate-intensity exercise).  Most adults should also do strengthening exercises at least twice a week. This is in addition to the  moderate-intensity exercise.  Maintain a healthy weight  Body mass index (BMI) is a measurement that can be used to identify possible weight problems. It estimates body fat based on height and weight. Your health care provider can help determine your BMI and help you achieve or maintain a healthy weight.  For females 68 years of age and older:   A BMI below 18.5 is considered underweight.  A BMI of 18.5 to 24.9 is normal.  A BMI of 25 to 29.9 is considered overweight.  A BMI of 30 and above is considered obese.  Watch levels of cholesterol and blood lipids  You should start having your blood tested for lipids and cholesterol at 71 years of age, then have this test every 5 years.  You may need to have your cholesterol levels checked more often if:  Your lipid or cholesterol levels are high.  You are older than 71 years of age.  You are at high risk for heart disease.  CANCER SCREENING   Lung Cancer  Lung cancer screening is recommended for adults 24-76 years old who are at high risk for lung cancer because of a history of smoking.  A yearly low-dose CT scan of the lungs is recommended for people who:  Currently smoke.  Have quit within the past 15 years.  Have at least a 30-pack-year history of smoking. A pack year is smoking an average of one pack of cigarettes a day for 1 year.  Yearly screening should  continue until it has been 15 years since you quit.  Yearly screening should stop if you develop a health problem that would prevent you from having lung cancer treatment.  Breast Cancer  Practice breast self-awareness. This means understanding how your breasts normally appear and feel.  It also means doing regular breast self-exams. Let your health care provider know about any changes, no matter how small.  If you are in your 20s or 30s, you should have a clinical breast exam (CBE) by a health care provider every 1-3 years as part of a regular health exam.  If  you are 45 or older, have a CBE every year. Also consider having a breast X-ray (mammogram) every year.  If you have a family history of breast cancer, talk to your health care provider about genetic screening.  If you are at high risk for breast cancer, talk to your health care provider about having an MRI and a mammogram every year.  Breast cancer gene (BRCA) assessment is recommended for women who have family members with BRCA-related cancers. BRCA-related cancers include:  Breast.  Ovarian.  Tubal.  Peritoneal cancers.  Results of the assessment will determine the need for genetic counseling and BRCA1 and BRCA2 testing. Cervical Cancer Routine pelvic examinations to screen for cervical cancer are no longer recommended for nonpregnant women who are considered low risk for cancer of the pelvic organs (ovaries, uterus, and vagina) and who do not have symptoms. A pelvic examination may be necessary if you have symptoms including those associated with pelvic infections. Ask your health care provider if a screening pelvic exam is right for you.   The Pap test is the screening test for cervical cancer for women who are considered at risk.  If you had a hysterectomy for a problem that was not cancer or a condition that could lead to cancer, then you no longer need Pap tests.  If you are older than 65 years, and you have had normal Pap tests for the past 10 years, you no longer need to have Pap tests.  If you have had past treatment for cervical cancer or a condition that could lead to cancer, you need Pap tests and screening for cancer for at least 20 years after your treatment.  If you no longer get a Pap test, assess your risk factors if they change (such as having a new sexual partner). This can affect whether you should start being screened again.  Some women have medical problems that increase their chance of getting cervical cancer. If this is the case for you, your health care  provider may recommend more frequent screening and Pap tests.  The human papillomavirus (HPV) test is another test that may be used for cervical cancer screening. The HPV test looks for the virus that can cause cell changes in the cervix. The cells collected during the Pap test can be tested for HPV.  The HPV test can be used to screen women 63 years of age and older. Getting tested for HPV can extend the interval between normal Pap tests from three to five years.  An HPV test also should be used to screen women of any age who have unclear Pap test results.  After 71 years of age, women should have HPV testing as often as Pap tests.  Colorectal Cancer  This type of cancer can be detected and often prevented.  Routine colorectal cancer screening usually begins at 71 years of age and continues through 71 years  of age.  Your health care provider may recommend screening at an earlier age if you have risk factors for colon cancer.  Your health care provider may also recommend using home test kits to check for hidden blood in the stool.  A small camera at the end of a tube can be used to examine your colon directly (sigmoidoscopy or colonoscopy). This is done to check for the earliest forms of colorectal cancer.  Routine screening usually begins at age 62.  Direct examination of the colon should be repeated every 5-10 years through 71 years of age. However, you may need to be screened more often if early forms of precancerous polyps or small growths are found. Skin Cancer  Check your skin from head to toe regularly.  Tell your health care provider about any new moles or changes in moles, especially if there is a change in a mole's shape or color.  Also tell your health care provider if you have a mole that is larger than the size of a pencil eraser.  Always use sunscreen. Apply sunscreen liberally and repeatedly throughout the day.  Protect yourself by wearing long sleeves, pants, a  wide-brimmed hat, and sunglasses whenever you are outside. HEART DISEASE, DIABETES, AND HIGH BLOOD PRESSURE   Have your blood pressure checked at least every 1-2 years. High blood pressure causes heart disease and increases the risk of stroke.  If you are between 39 years and 3 years old, ask your health care provider if you should take aspirin to prevent strokes.  Have regular diabetes screenings. This involves taking a blood sample to check your fasting blood sugar level.  If you are at a normal weight and have a low risk for diabetes, have this test once every three years after 71 years of age.  If you are overweight and have a high risk for diabetes, consider being tested at a younger age or more often. PREVENTING INFECTION  Hepatitis B  If you have a higher risk for hepatitis B, you should be screened for this virus. You are considered at high risk for hepatitis B if:  You were born in a country where hepatitis B is common. Ask your health care provider which countries are considered high risk.  Your parents were born in a high-risk country, and you have not been immunized against hepatitis B (hepatitis B vaccine).  You have HIV or AIDS.  You use needles to inject street drugs.  You live with someone who has hepatitis B.  You have had sex with someone who has hepatitis B.  You get hemodialysis treatment.  You take certain medicines for conditions, including cancer, organ transplantation, and autoimmune conditions. Hepatitis C  Blood testing is recommended for:  Everyone born from [birth year] through 1963.  Anyone with known risk factors for hepatitis C. Sexually transmitted infections (STIs)  You should be screened for sexually transmitted infections (STIs) including gonorrhea and chlamydia if:  You are sexually active and are younger than 71 years of age.  You are older than 71 years of age and your health care provider tells you that you are at risk for this type of  infection.  Your sexual activity has changed since you were last screened and you are at an increased risk for chlamydia or gonorrhea. Ask your health care provider if you are at risk.  If you do not have HIV, but are at risk, it may be recommended that you take a prescription medicine daily to prevent HIV  infection. This is called pre-exposure prophylaxis (PrEP). You are considered at risk if:  You are sexually active and do not regularly use condoms or know the HIV status of your partner(s).  You take drugs by injection.  You are sexually active with a partner who has HIV. Talk with your health care provider about whether you are at high risk of being infected with HIV. If you choose to begin PrEP, you should first be tested for HIV. You should then be tested every 3 months for as long as you are taking PrEP.  PREGNANCY   If you are premenopausal and you may become pregnant, ask your health care provider about preconception counseling.  If you may become pregnant, take 400 to 800 micrograms (mcg) of folic acid every day.  If you want to prevent pregnancy, talk to your health care provider about birth control (contraception). OSTEOPOROSIS AND MENOPAUSE   Osteoporosis is a disease in which the bones lose minerals and strength with aging. This can result in serious bone fractures. Your risk for osteoporosis can be identified using a bone density scan.  If you are 69 years of age or older, or if you are at risk for osteoporosis and fractures, ask your health care provider if you should be screened.  Ask your health care provider whether you should take a calcium or vitamin D supplement to lower your risk for osteoporosis.  Menopause may have certain physical symptoms and risks.  Hormone replacement therapy may reduce some of these symptoms and risks. Talk to your health care provider about whether hormone replacement therapy is right for you.  HOME CARE INSTRUCTIONS   Schedule regular  health, dental, and eye exams.  Stay current with your immunizations.   Do not use any tobacco products including cigarettes, chewing tobacco, or electronic cigarettes.  If you are pregnant, do not drink alcohol.  If you are breastfeeding, limit how much and how often you drink alcohol.  Limit alcohol intake to no more than 1 drink per day for nonpregnant women. One drink equals 12 ounces of beer, 5 ounces of wine, or 1 ounces of hard liquor.  Do not use street drugs.  Do not share needles.  Ask your health care provider for help if you need support or information about quitting drugs.  Tell your health care provider if you often feel depressed.  Tell your health care provider if you have ever been abused or do not feel safe at home. Document Released: 01/15/2011 Document Revised: 11/16/2013 Document Reviewed: 06/03/2013 [facility] Patient Information 2015 [place], [place]. This information is not intended to replace advice given to you by your health care provider. Make sure you discuss any questions you have with your health care provider.

## 2014-03-04 NOTE — Progress Notes (Signed)
Pre visit review using our clinic review tool, if applicable. No additional management support is needed unless otherwise documented below in the visit note. 

## 2014-03-04 NOTE — Assessment & Plan Note (Addendum)
She has had difficulty losing weight due to increased appetite .  She is fully retired and has the time,  Has decided to get a Clinical research associate.  Recent ankle fracture has been a hindrance. Will refer to a personal trainer who has some experience with patients who are older with orthopedic histories.  I have addressed  BMI and recommended wt loss of 10% of body weigh over the next 6 months using a low glycemic index diet and regular exercise a minimum of 5 days per week.

## 2014-03-04 NOTE — Progress Notes (Signed)
Patient ID: Kara Harvey, female   DOB: [birth year], 73 y.o.   MRN: [id number]  The patient is here for annual Medicare wellness examination and management of other chronic and acute problems.  She has gained 7 lbs since February .  She has nor been exercising for the past 2 years since her ankle fracture.  She is retired.    The risk factors are reflected in the social history.  The roster of all physicians providing medical care to patient - is listed in the Snapshot section of the chart.  Activities of daily living:  The patient is 100% independent in all ADLs: dressing, toileting, feeding as well as independent mobility  Home safety : The patient has smoke detectors in the home. They wear seatbelts.  There are no firearms at home. There is no violence in the home.   There is no risks for hepatitis, STDs or HIV. There is no   history of blood transfusion. They have no travel history to infectious disease endemic areas of the world.  The patient has seen their dentist in the last six month. They have seen their eye doctor in the last year. They admit to slight hearing difficulty with regard to whispered voices and some television programs.  They have deferred audiologic testing in the last year.  They do not  have excessive sun exposure. Discussed the need for sun protection: hats, long sleeves and use of sunscreen if there is significant sun exposure.   Diet: the importance of a healthy diet is discussed. They do have a healthy diet.  The benefits of regular aerobic exercise were discussed. She walks 4 times per week ,  20 minutes.   Depression screen: there are no signs or vegative symptoms of depression- irritability, change in appetite, anhedonia, sadness/tearfullness.  Cognitive assessment: the patient manages all their financial and personal affairs and is actively engaged. They could relate day,date,year and events; recalled 2/3 objects at 3 m  Oinutes; performed clock-face test  normally.  The following portions of the patient's history were reviewed and updated as appropriate: allergies, current medications, past family history, past medical history,  past surgical history, past social history  and problem list.  Visual acuity was not assessed per patient preference since she has regular follow up with her ophthalmologist. Hearing and body mass index were assessed and reviewed.   During the course of the visit the patient was educated and counseled about appropriate screening and preventive services including : fall prevention , diabetes screening, nutrition counseling, colorectal cancer screening, and recommended immunizations.    MMSE: 29/30  Objective:  BP 134/76  Pulse 73  Temp(Src) 98 F (36.7 C) (Oral)  Resp 14  Ht 5\' 3"  (1.6 m)  Wt 178 lb 12 oz (81.08 kg)  BMI 31.67 kg/m2  SpO2 97%  General Appearance:    Alert, cooperative, no distress, appears stated age  Head:    Normocephalic, without obvious abnormality, atraumatic  Eyes:    PERRL, conjunctiva/corneas clear, EOM's intact, fundi    benign, both eyes  Ears:    Normal TM's and external ear canals, both ears  Nose:   Nares normal, septum midline, mucosa normal, no drainage    or sinus tenderness  Throat:   Lips, mucosa, and tongue normal; teeth and gums normal  Neck:   Supple, symmetrical, trachea midline, no adenopathy;    thyroid:  no enlargement/tenderness/nodules; no carotid   bruit or JVD  Back:     Symmetric, no  curvature, ROM normal, no CVA tenderness  Lungs:     Clear to auscultation bilaterally, respirations unlabored  Chest Wall:    No tenderness or deformity   Heart:    Regular rate and rhythm, S1 and S2 normal, no murmur, rub   or gallop  Breast Exam:    No tenderness, masses, or nipple abnormality  Abdomen:     Soft, non-tender, bowel sounds active all four quadrants,    no masses, no organomegaly  Genitalia:    Pelvic: cervix normal in appearance, external genitalia normal, no  adnexal masses or tenderness, no cervical motion tenderness, rectovaginal septum normal, uterus normal size, shape, and consistency and vagina normal without discharge  Extremities:   Extremities normal, atraumatic, no cyanosis or edema  Pulses:   2+ and symmetric all extremities  Skin:   Skin color, texture, turgor normal, no rashes or lesions  Lymph nodes:   Cervical, supraclavicular, and axillary nodes normal  Neurologic:   CNII-XII intact, normal strength, sensation and reflexes    throughout   Assessment and Plan:  Obesity, unspecified She has had difficulty losing weight due to increased appetite .  She is fully retired and has the time,  Has decided to get a Clinical research associate.  Recent ankle fracture has been a hindrance. Will refer to a personal trainer who has some experience with patients who are older with orthopedic histories.  I have addressed  BMI and recommended wt loss of 10% of body weigh over the next 6 months using a low glycemic index diet and regular exercise a minimum of 5 days per week.    Medicare annual wellness visit, subsequent Annual Medicare wellness  exam was done as well as a comprehensive physical exam and management of acute and chronic conditions .  Health maintenance screenings have ben addressed and hand out given .   Essential hypertension, benign Well controlled on current regimen. Renal function stable, no changes today.  Lab Results  Component Value Date   CREATININE 0.8 03/01/2014   Lab Results  Component Value Date   NA 138 03/01/2014   K 4.0 03/01/2014   CL 101 03/01/2014   CO2 28 03/01/2014     Other and unspecified hyperlipidemia LDL and triglycerides are at goal on current medications. she has no side effects and liver enzymes are normal. No changes today.  Lab Results  Component Value Date   CHOL 175 03/01/2014   HDL 80.50 03/01/2014   LDLCALC 83 03/01/2014   LDLDIRECT 135.1 09/03/2013   TRIG 58.0 03/01/2014   CHOLHDL 2 03/01/2014   Lab Results   Component Value Date   ALT 20 03/01/2014   AST 20 03/01/2014   ALKPHOS 65 03/01/2014   BILITOT 0.6 03/01/2014       Updated Medication List Outpatient Encounter Prescriptions as of 03/04/2014  Medication Sig  . atorvastatin (LIPITOR) 20 MG tablet Take 1 tablet (20 mg total) by mouth daily.  Marland Kitchen lisinopril-hydrochlorothiazide (PRINZIDE,ZESTORETIC) 10-12.5 MG per tablet take 1 tablet by mouth once daily  . TDaP (BOOSTRIX) 5-2.5-18.5 LF-MCG/0.5 injection Inject 0.5 mLs into the muscle once.  Marland Kitchen buPROPion (WELLBUTRIN SR) 150 MG 12 hr tablet Take 1 tablet (150 mg total) by mouth 2 (two) times daily.  . valACYclovir (VALTREX) 1000 MG tablet Take 1 tablet (1,000 mg total) by mouth 2 (two) times daily.

## 2014-03-06 ENCOUNTER — Encounter: Payer: Self-pay | Admitting: Internal Medicine

## 2014-03-06 NOTE — Assessment & Plan Note (Signed)
LDL and triglycerides are at goal on current medications. she has no side effects and liver enzymes are normal. No changes today.  Lab Results  Component Value Date   CHOL 175 03/01/2014   HDL 80.50 03/01/2014   LDLCALC 83 03/01/2014   LDLDIRECT 135.1 09/03/2013   TRIG 58.0 03/01/2014   CHOLHDL 2 03/01/2014   Lab Results  Component Value Date   ALT 20 03/01/2014   AST 20 03/01/2014   ALKPHOS 65 03/01/2014   BILITOT 0.6 03/01/2014

## 2014-03-06 NOTE — Assessment & Plan Note (Signed)
Well controlled on current regimen. Renal function stable, no changes today.  Lab Results  Component Value Date   CREATININE 0.8 03/01/2014   Lab Results  Component Value Date   NA 138 03/01/2014   K 4.0 03/01/2014   CL 101 03/01/2014   CO2 28 03/01/2014

## 2014-03-06 NOTE — Assessment & Plan Note (Signed)
Annual Medicare wellness  exam was done as well as a comprehensive physical exam and management of acute and chronic conditions .  Health maintenance screenings have ben addressed and hand out given .

## 2014-04-26 ENCOUNTER — Ambulatory Visit
Admission: RE | Admit: 2014-04-26 | Discharge: 2014-04-26 | Disposition: A | Discharge status: Home / Self Care | Payer: Medicare Other | Source: Ambulatory Visit | Attending: Internal Medicine | Admitting: Internal Medicine

## 2014-04-26 DIAGNOSIS — Z1239 Encounter for other screening for malignant neoplasm of breast: Secondary | ICD-10-CM

## 2014-06-30 ENCOUNTER — Other Ambulatory Visit: Payer: Self-pay | Admitting: Internal Medicine

## 2014-09-06 ENCOUNTER — Ambulatory Visit: Payer: Medicare Other | Admitting: Internal Medicine

## 2014-09-12 ENCOUNTER — Other Ambulatory Visit: Payer: Self-pay | Admitting: Internal Medicine

## 2014-09-13 ENCOUNTER — Ambulatory Visit (INDEPENDENT_AMBULATORY_CARE_PROVIDER_SITE_OTHER): Payer: Medicare Other | Admitting: Internal Medicine

## 2014-09-13 ENCOUNTER — Encounter: Payer: Self-pay | Admitting: Internal Medicine

## 2014-09-13 ENCOUNTER — Encounter (INDEPENDENT_AMBULATORY_CARE_PROVIDER_SITE_OTHER): Payer: Self-pay

## 2014-09-13 VITALS — BP 118/68 | HR 78 | Temp 98.1°F | Resp 16 | Ht 63.0 in | Wt 179.8 lb

## 2014-09-13 DIAGNOSIS — I10 Essential (primary) hypertension: Secondary | ICD-10-CM

## 2014-09-13 DIAGNOSIS — E669 Obesity, unspecified: Secondary | ICD-10-CM | POA: Diagnosis not present

## 2014-09-13 DIAGNOSIS — Z79899 Other long term (current) drug therapy: Secondary | ICD-10-CM | POA: Diagnosis not present

## 2014-09-13 MED ORDER — TETANUS-DIPHTH-ACELL PERTUSSIS 5-2.5-18.5 LF-MCG/0.5 IM SUSP: 0.5000 mL | Freq: Once | INTRAMUSCULAR | Status: DC

## 2014-09-13 MED ORDER — BUPROPION HCL ER (SR) 150 MG PO TB12
150.0000 mg | ORAL_TABLET | Freq: Two times a day (BID) | ORAL | Status: DC
Start: 2014-09-13 — End: 2015-12-19

## 2014-09-13 NOTE — Progress Notes (Signed)
Patient ID: Kara Harvey, female   DOB: May 14, 1941, 74 y.o.   MRN: [id number]  Patient Active Problem List   Diagnosis Date Noted  . Medicare annual wellness visit, subsequent 03/06/2014  . Left malleolar fracture 03/04/2014  . Cataract extraction status of left eye 03/04/2014  . Obesity 09/05/2013  . Urge incontinence 03/27/2013  . Essential hypertension, benign 03/27/2013  . Other and unspecified hyperlipidemia 03/27/2013    Subjective:  CC:   Chief Complaint  Patient presents with  . Follow-up    6 month for medication    HPI:   Kara Harvey is a 74 y.o. female who presents for  6 month Follow up on multiple issues including hypertension, hyperlipidemia, and obesity .  She is taking her medications as directed without side effects.  Not exercising regularly or following a specific diet due to the distraction of her continually decompensating adult son Liliane Channel who lives with her and husband due to disability.  He has been in a manic state for over 4 months and was recently hospitalized/ involuntarily committed for one week ,  She admits that she was relieved when he was hospitalized bc of the stress.    She has been soliciting the company of a neighbor to help her jump start a regular exercise program but thus far has not made the first step  .  She has done well in the past on a low GI diet and is receptive to starting one again.    Past Medical History  Diagnosis Date  . Hyperlipidemia   . Hypertension     Past Surgical History  Procedure Laterality Date  . Fracture surgery Left 01/14/2011    ankle  . Tonsilectomy, adenoidectomy, bilateral myringotomy and tubes Bilateral 1957    does not believe adenoids were removed  . Tubal ligation  y-37  . Breast surgery Left 1986    for false positive/biopsy  . Eye surgery Left 6wks ago    cataract       The following portions of the patient's history were reviewed and updated as appropriate: Allergies, current  medications, and problem list.    Review of Systems:   Patient denies headache, fevers, malaise, unintentional weight loss, skin rash, eye pain, sinus congestion and sinus pain, sore throat, dysphagia,  hemoptysis , cough, dyspnea, wheezing, chest pain, palpitations, orthopnea, edema, abdominal pain, nausea, melena, diarrhea, constipation, flank pain, dysuria, hematuria, urinary  Frequency, nocturia, numbness, tingling, seizures,  Focal weakness, Loss of consciousness,  Tremor, insomnia, depression, anxiety, and suicidal ideation.     History   Social History  . Marital Status: Married    Spouse Name: N/A  . Number of Children: N/A  . Years of Education: N/A   Occupational History  . Not on file.   Social History Main Topics  . Smoking status: Former Smoker -- 2.00 packs/day    Types: Cigarettes    Quit date: 03/28/1979  . Smokeless tobacco: Never Used  . Alcohol Use: 3.0 oz/week    5 Glasses of wine per week  . Drug Use: No  . Sexual Activity: Yes   Other Topics Concern  . Not on file   Social History Narrative    Objective:  Filed Vitals:   09/13/14 1514  BP: 118/68  Pulse: 78  Temp: 98.1 F (36.7 C)  Resp: 16     General appearance: alert, cooperative and appears stated age Ears: normal TM's and external ear canals both ears Throat: lips, mucosa, and  tongue normal; teeth and gums normal Neck: no adenopathy, no carotid bruit, supple, symmetrical, trachea midline and thyroid not enlarged, symmetric, no tenderness/mass/nodules Back: symmetric, no curvature. ROM normal. No CVA tenderness. Lungs: clear to auscultation bilaterally Heart: regular rate and rhythm, S1, S2 normal, no murmur, click, rub or gallop Abdomen: soft, non-tender; bowel sounds normal; no masses,  no organomegaly Pulses: 2+ and symmetric Skin: Skin color, texture, turgor normal. No rashes or lesions Lymph nodes: Cervical, supraclavicular, and axillary nodes normal.  Assessment and  Plan:  Essential hypertension, benign Well controlled on current regimen. Renal function is overdue for assessment, labs ordered, no changes today.  Lab Results  Component Value Date   CREATININE 0.8 03/01/2014   Lab Results  Component Value Date   NA 138 03/01/2014   K 4.0 03/01/2014   CL 101 03/01/2014   CO2 28 03/01/2014       Obesity I have addressed  BMI and recommended wt loss of 10% of body weigh over the next 6 months using a low glycemic index diet and regular exercise a minimum of 5 days per week. Example of low GI diet was printed and given to pateint.     A total of 15 minutes of face to face time was spent with patient more than half of which was spent in counselling on the above mentioned issues.    Updated Medication List Outpatient Encounter Prescriptions as of 09/13/2014  Medication Sig  . atorvastatin (LIPITOR) 20 MG tablet take 1 tablet by mouth once daily  . buPROPion (WELLBUTRIN SR) 150 MG 12 hr tablet Take 1 tablet (150 mg total) by mouth 2 (two) times daily.  . Cholecalciferol (VITAMIN D3) 1000 UNITS CAPS Take 1,000 Units by mouth daily.  Marland Kitchen lisinopril-hydrochlorothiazide (PRINZIDE,ZESTORETIC) 10-12.5 MG per tablet take 1 tablet by mouth once daily  . valACYclovir (VALTREX) 1000 MG tablet Take 1 tablet (1,000 mg total) by mouth 2 (two) times daily.  . [DISCONTINUED] buPROPion (WELLBUTRIN SR) 150 MG 12 hr tablet Take 1 tablet (150 mg total) by mouth 2 (two) times daily.  . TDaP (BOOSTRIX) 5-2.5-18.5 LF-MCG/0.5 injection Inject 0.5 mLs into the muscle once. (Patient not taking: Reported on 09/13/2014)  . Tdap (BOOSTRIX) 5-2.5-18.5 LF-MCG/0.5 injection Inject 0.5 mLs into the muscle once.     Orders Placed This Encounter  Procedures  . Comprehensive metabolic panel    Return in about 6 months (around 03/14/2015).

## 2014-09-13 NOTE — Progress Notes (Signed)
Pre-visit discussion using our clinic review tool. No additional management support is needed unless otherwise documented below in the visit note.  

## 2014-09-13 NOTE — Patient Instructions (Addendum)
Your blood pressure is well controlled .  NO changes are needed   I do support you in your plan to lose weight !!! (through diet and exercise) Your first goal is to lose 12 lbs,  To get don to 167  Lbs (BMI < 30)   Premier Protein  Makes a delicious low carb chocolate shake that are as good as Atkins   This is  my version of a  "Low GI"  Weight loss Diet:  It will allow you to lose 4 to 8  lbs  per month if you follow it carefully.  Your goal with exercise is a minimum of 30 minutes of aerobic exercise 5 days per week (Walking does not count once it becomes easy!)     All of the foods can be found at grocery stores and in bulk at [employer].  The Atkins protein bars and shakes are available in more varieties at [employer], [employer] and [place].     7 AM Breakfast:  Choose from the following:  Low carbohydrate Protein  Shakes (I recommend the EAS AdvantEdge "Carb Control" shakes, Atkins,  Muscle Milk or Premier Protein shakes  All are < 4  carbs   a scrambled egg/bacon/cheese burrito made with [place]'s "carb balance" whole wheat tortilla  (about 10 net carbs )  A slice of home made fritatta (egg based dish without a crust:  google it)    Avoid cereal and bananas, oatmeal and cream of wheat and grits. They are loaded with carbohydrates!   10 AM: high protein snack  Protein bar by Atkins  Or KIND  (the snack size, under 200 cal, usually < 6 net carbs).    A stick of cheese:  Around 1 carb,  100 cal      Other so called "protein bars" and Greek yogurts tend to be loaded with carbohydrates.  Remember, in food advertising, the word "energy" is synonymous for " carbohydrate."  Lunch:   A Sandwich using the bread choices listed, Can use any  Eggs,  lunchmeat, grilled meat or canned tuna), avocado, regular mayo/mustard  and cheese.  A Salad using blue cheese, ranch,  Goddess or vinagrette,  No croutons or "confetti" and no "candied nuts" but regular nuts OK.   No pretzels or chips.  Pickles and  miniature sweet peppers are a good low carb alternative that provide a "crunch"  The bread is the only source of carbohydrate in a sandwich and  can be decreased by trying some of these alternatives to traditional loaf bread  Joseph's makes a pita bread and a flat bread that are 50 cal and 4 net carbs available at [place] and [place].  This can be toasted to use with hummous as well  Toufayan makes a low carb flatbread that's 100 cal and 9 net carbs available at [employer] and [employer]'s makes 2 sizes of  Low carb whole wheat tortilla  (The large one is 210 cal and 6 net carbs)  Flat Out makes flatbreads that are low carb as well  Avoid "Low fat dressings, as well as Barry Brunner and [place] dressings They are loaded with sugar!   3 PM/ Mid day  Snack:  Consider  1 ounce of  almonds, walnuts, pistachios, pecans, peanuts,  Macadamia nuts or a nut medley.  Avoid "granola"; the dried cranberries and raisins are loaded with carbohydrates. Mixed nuts as long as there are no raisins,  cranberries or dried fruit.  Try the prosciutto/mozzarella cheese sticks by [employer]  In deli /backery section   High protein   To avoid overindulging in snacks: Try drinking a glass of unsweeted almond/coconut milk  Or a cup of coffee with your Atkins chocolate bar to keep you from having 3!!!   Pork rinds!  Yes Pork Rinds        6 PM  Dinner:     Meat/fowl/fish with a green salad, and either broccoli, cauliflower, green beans, spinach, brussel sprouts or  Lima beans. DO NOT BREAD THE PROTEIN!!      There is a low carb pasta by Dreamfield's that is acceptable and tastes great: only 5 digestible carbs/serving.( All grocery stores but BJs carry it )  Try Hurley Cisco Angelo's chicken piccata or chicken or eggplant parm over low carb pasta.(Lowes and BJs)   Marjory Lies Sanchez's "Carnitas" (pulled pork, no sauce,  0 carbs) or his beef pot roast to make a dinner burrito (at [employer]J's)  Pesto over low carb pasta (bj's sells a good  quality pesto in the center refrigerated section of the deli   Try satueeing  Cheral Marker with mushroooms  Whole wheat pasta is still full of digestible carbs and  Not as low in glycemic index as Dreamfield's.   Brown rice is still rice,  So skip the rice and noodles if you eat [place] or [place] (or at least limit to 1/2 cup)  9 PM snack :   Breyer's "low carb" fudgsicle or  ice cream bar (Carb Smart line), or  Weight Watcher's ice cream bar , or another "no sugar added" ice cream;  a serving of fresh berries/cherries with whipped cream   Cheese or DANNON'S LlGHT [employer] GREEK YOGURT or the [place] greek yogurt   8 ounces of [place] unsweetened almond/cococunut milk  Cheese and crackers (using WASA crackers,  They are low carb) or peanut butter on low carb crackers or pita bread     Avoid bananas, pineapple, grapes  and watermelon on a regular basis because they are high in sugar.  THINK OF THEM AS DESSERT  Remember that snack Substitutions should be less than 10 NET carbs per serving and meals should be < 20 net carbs. Remember that carbohydrates from fiber do not affect blood sugar, so you can  subtract fiber grams to get the "net carbs " of any particular food item.

## 2014-09-14 ENCOUNTER — Encounter: Payer: Self-pay | Admitting: Internal Medicine

## 2014-09-14 LAB — COMPREHENSIVE METABOLIC PANEL
ALBUMIN: 4.5 g/dL (ref 3.5–5.2)
ALT: 15 U/L (ref 0–35)
AST: 18 U/L (ref 0–37)
Alkaline Phosphatase: 74 U/L (ref 39–117)
BUN: 20 mg/dL (ref 6–23)
CALCIUM: 10.4 mg/dL (ref 8.4–10.5)
CHLORIDE: 100 meq/L (ref 96–112)
CO2: 30 mEq/L (ref 19–32)
Creatinine, Ser: 0.77 mg/dL (ref 0.40–1.20)
GFR: 78.28 mL/min (ref >60.00)
GLUCOSE: 87 mg/dL (ref 70–99)
POTASSIUM: 4.2 meq/L (ref 3.5–5.1)
Sodium: 137 mEq/L (ref 135–145)
TOTAL PROTEIN: 7.3 g/dL (ref 6.0–8.3)
Total Bilirubin: 0.4 mg/dL (ref 0.2–1.2)

## 2014-09-14 NOTE — Assessment & Plan Note (Signed)
Well controlled on current regimen. Renal function is overdue for assessment, labs ordered, no changes today.  Lab Results  Component Value Date   CREATININE 0.8 03/01/2014   Lab Results  Component Value Date   NA 138 03/01/2014   K 4.0 03/01/2014   CL 101 03/01/2014   CO2 28 03/01/2014

## 2014-09-14 NOTE — Assessment & Plan Note (Signed)
I have addressed  BMI and recommended wt loss of 10% of body weigh over the next 6 months using a low glycemic index diet and regular exercise a minimum of 5 days per week. Example of low GI diet was printed and given to pateint.

## 2014-09-16 ENCOUNTER — Encounter: Payer: Self-pay | Admitting: *Deleted

## 2015-02-01 ENCOUNTER — Other Ambulatory Visit: Payer: Self-pay | Admitting: Internal Medicine

## 2015-04-14 ENCOUNTER — Other Ambulatory Visit: Payer: Self-pay | Admitting: Internal Medicine

## 2015-05-23 DIAGNOSIS — L821 Other seborrheic keratosis: Secondary | ICD-10-CM | POA: Diagnosis not present

## 2015-05-23 DIAGNOSIS — D1801 Hemangioma of skin and subcutaneous tissue: Secondary | ICD-10-CM | POA: Diagnosis not present

## 2015-05-23 DIAGNOSIS — L812 Freckles: Secondary | ICD-10-CM | POA: Diagnosis not present

## 2015-05-23 DIAGNOSIS — D225 Melanocytic nevi of trunk: Secondary | ICD-10-CM | POA: Diagnosis not present

## 2015-06-07 ENCOUNTER — Encounter: Payer: Self-pay | Admitting: Internal Medicine

## 2015-06-07 ENCOUNTER — Ambulatory Visit (INDEPENDENT_AMBULATORY_CARE_PROVIDER_SITE_OTHER): Payer: Medicare Other | Admitting: Internal Medicine

## 2015-06-07 VITALS — BP 122/78 | HR 76 | Temp 97.8°F | Resp 12 | Ht 63.0 in | Wt 175.0 lb

## 2015-06-07 DIAGNOSIS — E559 Vitamin D deficiency, unspecified: Secondary | ICD-10-CM

## 2015-06-07 DIAGNOSIS — I10 Essential (primary) hypertension: Secondary | ICD-10-CM | POA: Diagnosis not present

## 2015-06-07 DIAGNOSIS — L298 Other pruritus: Secondary | ICD-10-CM

## 2015-06-07 DIAGNOSIS — N898 Other specified noninflammatory disorders of vagina: Secondary | ICD-10-CM

## 2015-06-07 DIAGNOSIS — Z13820 Encounter for screening for osteoporosis: Secondary | ICD-10-CM

## 2015-06-07 LAB — LIPID PANEL
CHOL/HDL RATIO: 3
Cholesterol: 196 mg/dL (ref 0–200)
HDL: 75.5 mg/dL (ref >39.00)
LDL CALC: 105 mg/dL — AB (ref 0–99)
NonHDL: 120.64
TRIGLYCERIDES: 80 mg/dL (ref 0.0–149.0)
VLDL: 16 mg/dL (ref 0.0–40.0)

## 2015-06-07 LAB — COMPREHENSIVE METABOLIC PANEL
ALT: 16 U/L (ref 0–35)
AST: 17 U/L (ref 0–37)
Albumin: 4.3 g/dL (ref 3.5–5.2)
Alkaline Phosphatase: 73 U/L (ref 39–117)
BILIRUBIN TOTAL: 0.6 mg/dL (ref 0.2–1.2)
BUN: 16 mg/dL (ref 6–23)
CO2: 30 meq/L (ref 19–32)
CREATININE: 0.66 mg/dL (ref 0.40–1.20)
Calcium: 9.8 mg/dL (ref 8.4–10.5)
Chloride: 98 mEq/L (ref 96–112)
GFR: 93.33 mL/min (ref >60.00)
GLUCOSE: 98 mg/dL (ref 70–99)
Potassium: 3.9 mEq/L (ref 3.5–5.1)
SODIUM: 137 meq/L (ref 135–145)
Total Protein: 6.8 g/dL (ref 6.0–8.3)

## 2015-06-07 LAB — VITAMIN D 25 HYDROXY (VIT D DEFICIENCY, FRACTURES): VITD: 25.2 ng/mL — ABNORMAL LOW (ref 30.00–100.00)

## 2015-06-07 NOTE — Patient Instructions (Signed)
Your vaginal itching may be due to one of two conditions:  Lichen sclerosus et atrophicans  Paget's Disease  They are treated very differently,  So I would liek you to see Dr Servando Salina at [facility] for a vaginal biopsy  Lichen Sclerosus Lichen sclerosus is a skin problem. It can happen on any part of the body, but it commonly involves the anal or genital areas. It can cause itching and discomfort in these areas. Treatment can help to control symptoms. When the genital area is affected, getting treatment is important because the condition can cause scarring that may lead to other problems. CAUSES The cause of this condition is not known. It could be the result of an overactive immune system or a lack of certain hormones. Lichen sclerosus is not an infection or a fungus. It is not passed from one person to another (not contagious). RISK FACTORS This condition is more likely to develop in women, usually after menopause. SYMPTOMS Symptoms of this condition include:  Thin, wrinkled, white areas on the skin.  Thickened white areas on the skin.  Red and swollen patches (lesions) on the skin.  Tears or cracks in the skin.  Bruising.  Blood blisters.  Severe itching. You may also have pain, itching, or burning with urination. Constipation is also common in people with lichen sclerosus. DIAGNOSIS This condition may be diagnosed with a physical exam. In some cases, a tissue sample (biopsy sample) may be removed to be looked at under a microscope. TREATMENT This condition is usually treated with medicated creams or ointments (topical steroids) that are applied over the affected areas. HOME CARE INSTRUCTIONS  Take over-the-counter and prescription medicines only as told by your health care provider.  Use creams or ointments as told by your health care provider.  Do not scratch the affected areas of skin.  Women should keep the vaginal area as clean and dry as  possible.  Keep all follow-up visits as told by your health care provider. This is important. SEEK MEDICAL CARE IF:  You have increasing redness, swelling, or pain in the affected area.  You have fluid, blood, or pus coming from the affected area.  You have new lesions on your skin.  You have pain or burning with urination.  You have pain during sex.   This information is not intended to replace advice given to you by your health care provider. Make sure you discuss any questions you have with your health care provider.   Document Released: 11/22/2010 Document Revised: 03/23/2015 Document Reviewed: 09/27/2014 Elsevier Interactive Patient Education [employer].

## 2015-06-07 NOTE — Progress Notes (Signed)
Pre-visit discussion using our clinic review tool. No additional management support is needed unless otherwise documented below in the visit note.  

## 2015-06-09 DIAGNOSIS — N898 Other specified noninflammatory disorders of vagina: Secondary | ICD-10-CM | POA: Insufficient documentation

## 2015-06-09 NOTE — Progress Notes (Signed)
Subjective:  Patient ID: Kara Harvey, female    DOB: 10-15-1940  Age: 74 y.o. MRN: [id number]  CC: The primary encounter diagnosis was Screening for osteoporosis. Diagnoses of Vitamin D deficiency, Essential hypertension, Itching in the vaginal area, and Vaginal lesion were also pertinent to this visit.  HPI Kara Harvey presents for vaginal itching for the past month.  The itching is unaccompanied by vaginal discharge.  She has been trying over the counter topical medications without improvement.  She feels a rough patch of skin on the inside of her right labio minoria.  She has had occasional scant bleeding .   Outpatient Prescriptions Prior to Visit  Medication Sig Dispense Refill  . atorvastatin (LIPITOR) 20 MG tablet take 1 tablet by mouth once daily 90 tablet 1  . buPROPion (WELLBUTRIN SR) 150 MG 12 hr tablet Take 1 tablet (150 mg total) by mouth 2 (two) times daily. 60 tablet 5  . lisinopril-hydrochlorothiazide (PRINZIDE,ZESTORETIC) 10-12.5 MG per tablet take 1 tablet by mouth once daily 30 tablet 5  . valACYclovir (VALTREX) 1000 MG tablet Take 1 tablet (1,000 mg total) by mouth 2 (two) times daily. 20 tablet 5  . Cholecalciferol (VITAMIN D3) 1000 UNITS CAPS Take 1,000 Units by mouth daily.    . TDaP (BOOSTRIX) 5-2.5-18.5 LF-MCG/0.5 injection Inject 0.5 mLs into the muscle once. (Patient not taking: Reported on 09/13/2014) 0.5 mL 0  . Tdap (BOOSTRIX) 5-2.5-18.5 LF-MCG/0.5 injection Inject 0.5 mLs into the muscle once. 0.5 mL 0   No facility-administered medications prior to visit.    Review of Systems;  Patient denies headache, fevers, malaise, unintentional weight loss, skin rash, eye pain, sinus congestion and sinus pain, sore throat, dysphagia,  hemoptysis , cough, dyspnea, wheezing, chest pain, palpitations, orthopnea, edema, abdominal pain, nausea, melena, diarrhea, constipation, flank pain, dysuria, hematuria, urinary  Frequency, nocturia, numbness, tingling, seizures,   Focal weakness, Loss of consciousness,  Tremor, insomnia, depression, anxiety, and suicidal ideation.      Objective:  BP 122/78 mmHg  Pulse 76  Temp(Src) 97.8 F (36.6 C) (Oral)  Resp 12  Ht 5\' 3"  (1.6 m)  Wt 175 lb (79.379 kg)  BMI 31.01 kg/m2  SpO2 97%  BP Readings from Last 3 Encounters:  06/07/15 122/78  09/13/14 118/68  03/04/14 134/76    Wt Readings from Last 3 Encounters:  06/07/15 175 lb (79.379 kg)  09/13/14 179 lb 12 oz (81.534 kg)  03/04/14 178 lb 12 oz (81.08 kg)                General Appearance:    Alert, cooperative, no distress, appears stated age  Head:    Normocephalic, without obvious abnormality, atraumatic  Eyes:    PERRL, conjunctiva/corneas clear, EOM's intact, fundi    benign, both eyes  Ears:    Normal TM's and external ear canals, both ears  Nose:   Nares normal, septum midline, mucosa normal, no drainage    or sinus tenderness  Throat:   Lips, mucosa, and tongue normal; teeth and gums normal  Neck:   Supple, symmetrical, trachea midline, no adenopathy;    thyroid:  no enlargement/tenderness/nodules; no carotid   bruit or JVD     Genitalia:    Pelvic: cervix normal in appearance, right labia minora with area of cobblestoning  , no adnexal masses or tenderness, no cervical motion tenderness, rectovaginal septum normal, uterus normal size, shape, and consistency and vagina normal without discharge  Extremities:   Extremities normal, atraumatic, no  cyanosis or edema  Pulses:   2+ and symmetric all extremities  Skin:   Skin color, texture, turgor normal, no rashes or lesions  Lymph nodes:   Cervical, supraclavicular, and axillary nodes normal  Neurologic:   CNII-XII intact, normal strength, sensation and reflexes    throughout               No results found for: HGBA1C  Lab Results  Component Value Date   CREATININE 0.66 06/07/2015   CREATININE 0.77 09/13/2014   CREATININE 0.8 03/01/2014    Lab Results  Component Value Date    WBC 8.9 01/28/2011   HGB 12.7 01/28/2011   HCT 37.7 01/28/2011   PLT 258 01/28/2011   GLUCOSE 98 06/07/2015   CHOL 196 06/07/2015   TRIG 80.0 06/07/2015   HDL 75.50 06/07/2015   LDLDIRECT 135.1 09/03/2013   LDLCALC 105* 06/07/2015   ALT 16 06/07/2015   AST 17 06/07/2015   NA 137 06/07/2015   K 3.9 06/07/2015   CL 98 06/07/2015   CREATININE 0.66 06/07/2015   BUN 16 06/07/2015   CO2 30 06/07/2015   TSH 2.90 09/03/2013   MICROALBUR 0.1 09/03/2013    Mm Screening Breast Tomo Bilateral  04/27/2014  CLINICAL DATA:  Screening. EXAM: DIGITAL SCREENING BILATERAL MAMMOGRAM WITH 3D TOMO WITH CAD COMPARISON:  Previous exam(s). ACR Breast Density Category c: The breast tissue is heterogeneously dense, which may obscure small masses. FINDINGS: There are no findings suspicious for malignancy. Images were processed with CAD. IMPRESSION: No mammographic evidence of malignancy. A result letter of this screening mammogram will be mailed directly to the patient. RECOMMENDATION: Screening mammogram in one year. (Code:SM-B-01Y) BI-RADS CATEGORY  1: Negative. Electronically Signed   By: Lovey Newcomer M.D.   On: 04/27/2014 14:46    Assessment & Plan:   Problem List Items Addressed This Visit    Vaginal lesion    She has an area of cobblestoning on the right labia minora accompanied by persistent itching and occasional bleeding.  Referral to Dr Garwin Brothers for biopsy to rule out  Paget's Disease vs  Lichen sclerosis et atrophicans.      Relevant Orders   Ambulatory referral to Gynecology    Other Visit Diagnoses    Screening for osteoporosis    -  Primary    Relevant Orders    DG Bone Density    Vitamin D deficiency        Relevant Orders    VITAMIN D 25 Hydroxy (Vit-D Deficiency, Fractures) (Completed)    Essential hypertension        Relevant Orders    Comprehensive metabolic panel (Completed)    Lipid panel (Completed)    Itching in the vaginal area        Relevant Orders    Ambulatory referral  to Gynecology       I have discontinued Ms. Diefenderfer's Tdap and Tdap. I am also having her maintain her valACYclovir, Vitamin D3, buPROPion, lisinopril-hydrochlorothiazide, atorvastatin, miconazole, neomycin-polymyxin-pramoxine, benzocaine-resorcinol, and diphenhydrAMINE.  Meds ordered this encounter  Medications  . miconazole (MICOTIN) 2 % cream    Sig: Apply 1 application topically 2 (two) times daily.  Marland Kitchen neomycin-polymyxin-pramoxine (NEOSPORIN PLUS) 1 % cream    Sig: Apply topically 2 (two) times daily.  . benzocaine-resorcinol (VAGISIL) 5-2 % vaginal cream    Sig: Place 1 application vaginally at bedtime.  . diphenhydrAMINE (BENADRYL) 2 % cream    Sig: Apply 1 application topically 3 (three) times daily as needed  for itching.    Medications Discontinued During This Encounter  Medication Reason  . Tdap (BOOSTRIX) 5-2.5-18.5 LF-MCG/0.5 injection Error  . TDaP (BOOSTRIX) 5-2.5-18.5 LF-MCG/0.5 injection Error    Follow-up: No Follow-up on file.   Crecencio Mc, MD

## 2015-06-09 NOTE — Assessment & Plan Note (Addendum)
She has an area of cobblestoning on the right labia minora accompanied by persistent itching and occasional bleeding.  Referral to Dr Garwin Brothers for biopsy to rule out  Paget's Disease vs  Lichen sclerosis et atrophicans.

## 2015-06-14 ENCOUNTER — Encounter: Payer: Self-pay | Admitting: *Deleted

## 2015-07-05 ENCOUNTER — Other Ambulatory Visit: Payer: Self-pay | Admitting: Internal Medicine

## 2015-07-05 DIAGNOSIS — E2839 Other primary ovarian failure: Secondary | ICD-10-CM

## 2015-07-05 DIAGNOSIS — N9089 Other specified noninflammatory disorders of vulva and perineum: Secondary | ICD-10-CM | POA: Diagnosis not present

## 2015-07-05 DIAGNOSIS — N952 Postmenopausal atrophic vaginitis: Secondary | ICD-10-CM | POA: Diagnosis not present

## 2015-07-26 ENCOUNTER — Ambulatory Visit: Payer: Medicare Other | Attending: Internal Medicine

## 2015-07-27 ENCOUNTER — Telehealth: Payer: Self-pay | Admitting: *Deleted

## 2015-07-27 NOTE — Telephone Encounter (Signed)
Spoke with the patient, She missed her appointment yesterday at [facility] for her Bone Density test.  She was going to reschedule it, but then remembered in the past she went to a place in [place] on [address] to have it done.  She is wondering now if she should go back there or if the insurance works there or if she needs to reschedule at [facility].  Can you assist me as I am not sure what place she is referring to in [place].  Thanks

## 2015-07-27 NOTE — Telephone Encounter (Signed)
Patient requested a all back in regards to having a bone density test scheduled at [place]

## 2015-08-04 ENCOUNTER — Other Ambulatory Visit: Payer: Self-pay | Admitting: Internal Medicine

## 2015-08-04 DIAGNOSIS — E2839 Other primary ovarian failure: Secondary | ICD-10-CM

## 2015-08-04 DIAGNOSIS — Z1231 Encounter for screening mammogram for malignant neoplasm of breast: Secondary | ICD-10-CM

## 2015-08-16 ENCOUNTER — Other Ambulatory Visit: Payer: Self-pay | Admitting: Internal Medicine

## 2015-08-24 ENCOUNTER — Ambulatory Visit: Payer: Self-pay

## 2015-09-07 ENCOUNTER — Ambulatory Visit
Admission: RE | Admit: 2015-09-07 | Discharge: 2015-09-07 | Disposition: A | Discharge status: Home / Self Care | Payer: Medicare Other | Source: Ambulatory Visit | Attending: Internal Medicine | Admitting: Internal Medicine

## 2015-09-07 DIAGNOSIS — M85851 Other specified disorders of bone density and structure, right thigh: Secondary | ICD-10-CM | POA: Diagnosis not present

## 2015-09-07 DIAGNOSIS — Z1231 Encounter for screening mammogram for malignant neoplasm of breast: Secondary | ICD-10-CM

## 2015-09-07 DIAGNOSIS — E2839 Other primary ovarian failure: Secondary | ICD-10-CM

## 2015-09-12 ENCOUNTER — Telehealth: Payer: Self-pay | Admitting: Internal Medicine

## 2015-09-12 ENCOUNTER — Other Ambulatory Visit: Payer: Self-pay | Admitting: Internal Medicine

## 2015-09-12 MED ORDER — LISINOPRIL-HYDROCHLOROTHIAZIDE 10-12.5 MG PO TABS: 1.0000 | ORAL_TABLET | Freq: Every day | ORAL | Status: DC

## 2015-09-12 NOTE — Telephone Encounter (Signed)
Pt called to follow up on her Rx refill of lisinopril-hydrochlorothiazide (PRINZIDE,ZESTORETIC) 10-12.5 MG per tablet. Pt states pharmacy has been trying to get a refill since 09/07/2015. [facility]acy is [facility] [address] - [place], [place]. Call pt @ [phone]. Thank you!

## 2015-09-12 NOTE — Telephone Encounter (Signed)
Lisinopril already been sent

## 2015-09-12 NOTE — Telephone Encounter (Signed)
Already sent.

## 2015-09-16 ENCOUNTER — Encounter: Payer: Self-pay | Admitting: *Deleted

## 2015-11-07 ENCOUNTER — Other Ambulatory Visit: Payer: Self-pay

## 2015-11-07 MED ORDER — ATORVASTATIN CALCIUM 20 MG PO TABS: 20.0000 mg | ORAL_TABLET | Freq: Every day | ORAL | Status: DC

## 2015-12-19 ENCOUNTER — Other Ambulatory Visit: Payer: Self-pay | Admitting: Internal Medicine

## 2015-12-19 ENCOUNTER — Telehealth: Payer: Self-pay | Admitting: Internal Medicine

## 2015-12-19 MED ORDER — BUPROPION HCL ER (SR) 150 MG PO TB12: 150.0000 mg | ORAL_TABLET | Freq: Two times a day (BID) | ORAL | Status: DC

## 2015-12-19 NOTE — Telephone Encounter (Signed)
Pt is scheduled °

## 2015-12-19 NOTE — Telephone Encounter (Signed)
RefillED for 30 days only.  OFFICE VISIT NEEDED prior to any more refills 

## 2015-12-19 NOTE — Telephone Encounter (Signed)
Pt called to get refill for buPROPion (WELLBUTRIN SR) 150 MG 12 hr tablet.   Pharmacy is [facility] [address] - [place], [place]  Call pt @ [phone]. Thank you!

## 2015-12-19 NOTE — Telephone Encounter (Signed)
LVTCB so that she can schedule an office visit in regards to medication

## 2015-12-19 NOTE — Telephone Encounter (Signed)
Patient last seen on 05/2015. Please advise?

## 2015-12-19 NOTE — Telephone Encounter (Signed)
Request sent to provider.

## 2016-01-23 ENCOUNTER — Ambulatory Visit (INDEPENDENT_AMBULATORY_CARE_PROVIDER_SITE_OTHER): Payer: Medicare Other | Admitting: Internal Medicine

## 2016-01-23 ENCOUNTER — Encounter: Payer: Self-pay | Admitting: Internal Medicine

## 2016-01-23 VITALS — BP 110/66 | HR 78 | Temp 97.8°F | Resp 10 | Ht 63.0 in | Wt 175.5 lb

## 2016-01-23 DIAGNOSIS — I10 Essential (primary) hypertension: Secondary | ICD-10-CM | POA: Diagnosis not present

## 2016-01-23 DIAGNOSIS — F32A Depression, unspecified: Secondary | ICD-10-CM

## 2016-01-23 DIAGNOSIS — Z79899 Other long term (current) drug therapy: Secondary | ICD-10-CM | POA: Diagnosis not present

## 2016-01-23 DIAGNOSIS — F329 Major depressive disorder, single episode, unspecified: Secondary | ICD-10-CM | POA: Diagnosis not present

## 2016-01-23 LAB — COMPREHENSIVE METABOLIC PANEL
ALBUMIN: 4.1 g/dL (ref 3.5–5.2)
ALT: 18 U/L (ref 0–35)
AST: 17 U/L (ref 0–37)
Alkaline Phosphatase: 69 U/L (ref 39–117)
BILIRUBIN TOTAL: 0.4 mg/dL (ref 0.2–1.2)
BUN: 20 mg/dL (ref 6–23)
CALCIUM: 9.5 mg/dL (ref 8.4–10.5)
CO2: 28 mEq/L (ref 19–32)
CREATININE: 0.7 mg/dL (ref 0.40–1.20)
Chloride: 98 mEq/L (ref 96–112)
GFR: 87.05 mL/min (ref >60.00)
Glucose, Bld: 90 mg/dL (ref 70–99)
Potassium: 4 mEq/L (ref 3.5–5.1)
Sodium: 133 mEq/L — ABNORMAL LOW (ref 135–145)
TOTAL PROTEIN: 6.6 g/dL (ref 6.0–8.3)

## 2016-01-23 MED ORDER — BUPROPION HCL ER (SR) 150 MG PO TB12: 150.0000 mg | ORAL_TABLET | Freq: Two times a day (BID) | ORAL | Status: DC

## 2016-01-23 NOTE — Patient Instructions (Addendum)
[email or web address] has a great variety of light wieght compression stockings,  Knee highs and socks  You are doing well.  Labs today to endure liver enzymes are normal  I'll see you in 3 months for your annual exam

## 2016-01-23 NOTE — Progress Notes (Signed)
Subjective:  Patient ID: Kara Harvey, female    DOB: Mar 24, 1941  Age: 75 y.o. MRN: [id number]  CC: The primary encounter diagnosis was Long-term use of high-risk medication. Diagnoses of Essential hypertension, benign and Depressive disorder were also pertinent to this visit.  HPI Kara Harvey presents for follow up on depression, hypertension, hyperlipidemia, and obesity  She ran out of her wellbutrin several days ago (office denied refill) and felt decreased energy,  Decreased concentration and increased appetite .  She has adult  son who has Bipolar disorder with psychoses and lives with her and her husband, but she is handling the incresaed stress with a sense of humor.   Toelrating medications without cough  Some left ankle edema, chronic since her left malleolar fracture   Normal mammogram in February      Outpatient Prescriptions Prior to Visit  Medication Sig Dispense Refill  . atorvastatin (LIPITOR) 20 MG tablet Take 1 tablet (20 mg total) by mouth daily. 90 tablet 1  . Cholecalciferol (VITAMIN D3) 1000 UNITS CAPS Take 1,000 Units by mouth daily.    Marland Kitchen lisinopril-hydrochlorothiazide (PRINZIDE,ZESTORETIC) 10-12.5 MG tablet Take 1 tablet by mouth daily. 90 tablet 2  . valACYclovir (VALTREX) 1000 MG tablet take 2 tablets by mouth twice a day 20 tablet 5  . buPROPion (WELLBUTRIN SR) 150 MG 12 hr tablet Take 1 tablet (150 mg total) by mouth 2 (two) times daily. 60 tablet 0  . benzocaine-resorcinol (VAGISIL) 5-2 % vaginal cream Place 1 application vaginally at bedtime. Reported on 01/23/2016    . diphenhydrAMINE (BENADRYL) 2 % cream Apply 1 application topically 3 (three) times daily as needed for itching. Reported on 01/23/2016    . miconazole (MICOTIN) 2 % cream Apply 1 application topically 2 (two) times daily. Reported on 01/23/2016    . neomycin-polymyxin-pramoxine (NEOSPORIN PLUS) 1 % cream Apply topically 2 (two) times daily. Reported on 01/23/2016     No  facility-administered medications prior to visit.    Review of Systems;  Patient denies headache, fevers, malaise, unintentional weight loss, skin rash, eye pain, sinus congestion and sinus pain, sore throat, dysphagia,  hemoptysis , cough, dyspnea, wheezing, chest pain, palpitations, orthopnea, edema, abdominal pain, nausea, melena, diarrhea, constipation, flank pain, dysuria, hematuria, urinary  Frequency, nocturia, numbness, tingling, seizures,  Focal weakness, Loss of consciousness,  Tremor, insomnia, depression, anxiety, and suicidal ideation.      Objective:  BP 110/66 mmHg  Pulse 78  Temp(Src) 97.8 F (36.6 C) (Oral)  Resp 10  Ht 5' 3"  (1.6 m)  Wt 175 lb 8 oz (79.606 kg)  BMI 31.10 kg/m2  SpO2 97%  BP Readings from Last 3 Encounters:  01/23/16 110/66  06/07/15 122/78  09/13/14 118/68    Wt Readings from Last 3 Encounters:  01/23/16 175 lb 8 oz (79.606 kg)  06/07/15 175 lb (79.379 kg)  09/13/14 179 lb 12 oz (81.534 kg)    General appearance: alert, cooperative and appears stated age Ears: normal TM's and external ear canals both ears Throat: lips, mucosa, and tongue normal; teeth and gums normal Neck: no adenopathy, no carotid bruit, supple, symmetrical, trachea midline and thyroid not enlarged, symmetric, no tenderness/mass/nodules Back: symmetric, no curvature. ROM normal. No CVA tenderness. Lungs: clear to auscultation bilaterally Heart: regular rate and rhythm, S1, S2 normal, no murmur, click, rub or gallop Abdomen: soft, non-tender; bowel sounds normal; no masses,  no organomegaly Pulses: 2+ and symmetric Skin: Skin color, texture, turgor normal. No rashes or lesions Lymph  nodes: Cervical, supraclavicular, and axillary nodes normal.  No results found for: HGBA1C  Lab Results  Component Value Date   CREATININE 0.70 01/23/2016   CREATININE 0.66 06/07/2015   CREATININE 0.77 09/13/2014    Lab Results  Component Value Date   WBC 8.9 01/28/2011   HGB 12.7  01/28/2011   HCT 37.7 01/28/2011   PLT 258 01/28/2011   GLUCOSE 90 01/23/2016   CHOL 196 06/07/2015   TRIG 80.0 06/07/2015   HDL 75.50 06/07/2015   LDLDIRECT 135.1 09/03/2013   LDLCALC 105* 06/07/2015   ALT 18 01/23/2016   AST 17 01/23/2016   NA 133* 01/23/2016   K 4.0 01/23/2016   CL 98 01/23/2016   CREATININE 0.70 01/23/2016   BUN 20 01/23/2016   CO2 28 01/23/2016   TSH 2.90 09/03/2013   MICROALBUR 0.1 09/03/2013    Dg Bone Density  09/07/2015  EXAM: DUAL X-RAY ABSORPTIOMETRY (DXA) FOR BONE MINERAL DENSITY IMPRESSION: Referring Physician:  Crecencio Mc PATIENT: Name: Kara Harvey, Kara Harvey Patient ID: [id number] Birth Date: 1941-01-28 Height: 62.5 in. Sex: Female Measured: 09/07/2015 Weight: 177.0 lbs. Indications: Advanced Age, Caucasian, Estrogen Deficient, Family History of Osteoporosis, Height Loss (781.91), History of Fracture (Adult) (V15.51), Postmenopausal, Wellbutrin Fractures: Ankle Treatments: Vitamin D (E933.5) ASSESSMENT: The BMD measured at Femur Neck Right is 0.724 g/cm2 with a T-score of -2.3. This patient is considered to be osteopenic according to [facility]) criteria. Site Region Measured Date Measured Age YA BMD Significant CHANGE T-score DualFemur Neck Right 09/07/2015    [age]         -2.3    0.724 g/cm2 AP Spine  L1-L4      11/07/2015    75.1         -2.2    0.919 g/cm2 World Health Organization [facility]) criteria for post-menopausal, Caucasian Women: Normal       T-score at or above -1 SD Osteopenia   T-score between -1 and -2.5 SD Osteoporosis T-score at or below -2.5 SD RECOMMENDATION: [facility] recommends that FDA-approved medical therapies be considered in postmenopausal women and men age 71 or older with a: 1. Hip or vertebral (clinical or morphometric) fracture. 2. T-score of <-2.5 at the spine or hip. 3. Ten-year fracture probability by FRAX of 3% or greater for hip fracture or 20% or greater for major osteoporotic fracture.  All treatment decisions require clinical judgment and consideration of individual patient factors, including patient preferences, co-morbidities, previous drug use, risk factors not captured in the FRAX model (e.g. falls, vitamin D deficiency, increased bone turnover, interval significant decline in bone density) and possible under - or over-estimation of fracture risk by FRAX. All patients should ensure an adequate intake of dietary calcium (1200 mg/d) and vitamin D (800 IU daily) unless contraindicated. FOLLOW-UP: People with diagnosed cases of osteoporosis or at high risk for fracture should have regular bone mineral density tests. For patients eligible for Medicare, routine testing is allowed once every 2 years. The testing frequency can be increased to one year for patients who have rapidly progressing disease, those who are receiving or discontinuing medical therapy to restore bone mass, or have additional risk factors. I have reviewed this report, and agree with the above findings. [facility] FRAX* 10-year Probability of Fracture Based on femoral neck BMD: DualFemur (Right) Major Osteoporotic Fracture: 20.9% Hip Fracture:                5.1% Population:  [place] (Caucasian) Risk Factors:                History of Fracture (Adult) (V15.51) *FRAX is a [place] [employer] for Metabolic Bone Disease, a [place] (WHO) [facility]. ASSESSMENT: The probability of a major osteoporotic fracture is 20.9 % within the next ten years. The probability of a hip fracture is 5.1% within the next ten years. Electronically Signed   By: David  Martinique M.D.   On: 09/07/2015 15:44   Mm Screening Breast Tomo Bilateral  09/08/2015  CLINICAL DATA:  Screening. EXAM: DIGITAL SCREENING BILATERAL MAMMOGRAM WITH 3D TOMO WITH CAD COMPARISON:  Previous exam(s). ACR Breast Density Category c: The breast tissue is heterogeneously dense, which may  obscure small masses. FINDINGS: There are no findings suspicious for malignancy. Images were processed with CAD. IMPRESSION: No mammographic evidence of malignancy. A result letter of this screening mammogram will be mailed directly to the patient. RECOMMENDATION: Screening mammogram in one year. (Code:SM-B-01Y) BI-RADS CATEGORY  1: Negative. Electronically Signed   By: Claudie Revering M.D.   On: 09/08/2015 17:45    Assessment & Plan:   Problem List Items Addressed This Visit    Essential hypertension, benign    Well controlled on current regimen. Renal function stable, no changes today.  Lab Results  Component Value Date   CREATININE 0.70 01/23/2016   Lab Results  Component Value Date   NA 133* 01/23/2016   K 4.0 01/23/2016   CL 98 01/23/2016   CO2 28 01/23/2016         Long-term use of high-risk medication - Primary    CMET is normal except for very mild hyponatremia      Relevant Orders   Comp Met (CMET) (Completed)   Depressive disorder    She notes continued need for wellbutrin. Refills given.       Relevant Medications   buPROPion (WELLBUTRIN SR) 150 MG 12 hr tablet      I am having Kara Harvey maintain her Vitamin D3, miconazole, neomycin-polymyxin-pramoxine, benzocaine-resorcinol, diphenhydrAMINE, valACYclovir, lisinopril-hydrochlorothiazide, atorvastatin, and buPROPion.  Meds ordered this encounter  Medications  . buPROPion (WELLBUTRIN SR) 150 MG 12 hr tablet    Sig: Take 1 tablet (150 mg total) by mouth 2 (two) times daily.    Dispense:  180 tablet    Refill:  3    Medications Discontinued During This Encounter  Medication Reason  . buPROPion (WELLBUTRIN SR) 150 MG 12 hr tablet Reorder    Follow-up: Return in about 3 months (around 04/24/2016) for annual CPE/wellness.   Crecencio Mc, MD

## 2016-01-23 NOTE — Progress Notes (Signed)
Pre-visit discussion using our clinic review tool. No additional management support is needed unless otherwise documented below in the visit note.  

## 2016-01-25 DIAGNOSIS — Z79899 Other long term (current) drug therapy: Secondary | ICD-10-CM | POA: Insufficient documentation

## 2016-01-25 DIAGNOSIS — F329 Major depressive disorder, single episode, unspecified: Secondary | ICD-10-CM | POA: Insufficient documentation

## 2016-01-25 DIAGNOSIS — F32A Depression, unspecified: Secondary | ICD-10-CM | POA: Insufficient documentation

## 2016-01-25 NOTE — Assessment & Plan Note (Signed)
CMET is normal except for very mild hyponatremia

## 2016-01-25 NOTE — Assessment & Plan Note (Signed)
She notes continued need for wellbutrin. Refills given.  

## 2016-01-25 NOTE — Assessment & Plan Note (Signed)
Well controlled on current regimen. Renal function stable, no changes today.  Lab Results  Component Value Date   CREATININE 0.70 01/23/2016   Lab Results  Component Value Date   NA 133* 01/23/2016   K 4.0 01/23/2016   CL 98 01/23/2016   CO2 28 01/23/2016

## 2016-04-25 ENCOUNTER — Encounter (INDEPENDENT_AMBULATORY_CARE_PROVIDER_SITE_OTHER): Payer: Self-pay

## 2016-04-25 ENCOUNTER — Ambulatory Visit (INDEPENDENT_AMBULATORY_CARE_PROVIDER_SITE_OTHER): Payer: Medicare Other | Admitting: Internal Medicine

## 2016-04-25 VITALS — BP 114/74 | HR 72 | Temp 97.5°F | Resp 12 | Ht 63.0 in | Wt 180.0 lb

## 2016-04-25 DIAGNOSIS — I10 Essential (primary) hypertension: Secondary | ICD-10-CM

## 2016-04-25 DIAGNOSIS — E785 Hyperlipidemia, unspecified: Secondary | ICD-10-CM

## 2016-04-25 DIAGNOSIS — N898 Other specified noninflammatory disorders of vagina: Secondary | ICD-10-CM

## 2016-04-25 DIAGNOSIS — M85859 Other specified disorders of bone density and structure, unspecified thigh: Secondary | ICD-10-CM

## 2016-04-25 DIAGNOSIS — R238 Other skin changes: Secondary | ICD-10-CM | POA: Diagnosis not present

## 2016-04-25 DIAGNOSIS — Z0000 Encounter for general adult medical examination without abnormal findings: Secondary | ICD-10-CM

## 2016-04-25 DIAGNOSIS — E559 Vitamin D deficiency, unspecified: Secondary | ICD-10-CM

## 2016-04-25 DIAGNOSIS — R233 Spontaneous ecchymoses: Secondary | ICD-10-CM

## 2016-04-25 DIAGNOSIS — Z79899 Other long term (current) drug therapy: Secondary | ICD-10-CM | POA: Diagnosis not present

## 2016-04-25 LAB — CBC WITH DIFFERENTIAL/PLATELET
BASOS PCT: 0.5 % (ref 0.0–3.0)
Basophils Absolute: 0 10*3/uL (ref 0.0–0.1)
EOS ABS: 0.4 10*3/uL (ref 0.0–0.7)
Eosinophils Relative: 4.5 % (ref 0.0–5.0)
HEMATOCRIT: 38.8 % (ref 36.0–46.0)
HEMOGLOBIN: 12.9 g/dL (ref 12.0–15.0)
LYMPHS PCT: 30.9 % (ref 12.0–46.0)
Lymphs Abs: 2.7 10*3/uL (ref 0.7–4.0)
MCHC: 33.4 g/dL (ref 30.0–36.0)
MCV: 89.6 fl (ref 78.0–100.0)
Monocytes Absolute: 0.7 10*3/uL (ref 0.1–1.0)
Monocytes Relative: 7.6 % (ref 3.0–12.0)
Neutro Abs: 4.9 10*3/uL (ref 1.4–7.7)
Neutrophils Relative %: 56.5 % (ref 43.0–77.0)
Platelets: 310 10*3/uL (ref 150.0–400.0)
RBC: 4.33 Mil/uL (ref 3.87–5.11)
RDW: 13.7 % (ref 11.5–15.5)
WBC: 8.7 10*3/uL (ref 4.0–10.5)

## 2016-04-25 LAB — COMPREHENSIVE METABOLIC PANEL
ALBUMIN: 4 g/dL (ref 3.5–5.2)
ALT: 16 U/L (ref 0–35)
AST: 16 U/L (ref 0–37)
Alkaline Phosphatase: 66 U/L (ref 39–117)
BILIRUBIN TOTAL: 0.4 mg/dL (ref 0.2–1.2)
BUN: 19 mg/dL (ref 6–23)
CALCIUM: 9.3 mg/dL (ref 8.4–10.5)
CO2: 26 meq/L (ref 19–32)
CREATININE: 0.77 mg/dL (ref 0.40–1.20)
Chloride: 98 mEq/L (ref 96–112)
GFR: 77.93 mL/min (ref >60.00)
Glucose, Bld: 88 mg/dL (ref 70–99)
Potassium: 3.5 mEq/L (ref 3.5–5.1)
Sodium: 132 mEq/L — ABNORMAL LOW (ref 135–145)
Total Protein: 6.6 g/dL (ref 6.0–8.3)

## 2016-04-25 LAB — LIPID PANEL
CHOL/HDL RATIO: 2
CHOLESTEROL: 176 mg/dL (ref 0–200)
HDL: 78.8 mg/dL (ref >39.00)
LDL Cholesterol: 79 mg/dL (ref 0–99)
NonHDL: 97.64
TRIGLYCERIDES: 92 mg/dL (ref 0.0–149.0)
VLDL: 18.4 mg/dL (ref 0.0–40.0)

## 2016-04-25 LAB — TSH: TSH: 1.72 u[IU]/mL (ref 0.35–4.50)

## 2016-04-25 LAB — VITAMIN D 25 HYDROXY (VIT D DEFICIENCY, FRACTURES): VITD: 23.27 ng/mL — ABNORMAL LOW (ref 30.00–100.00)

## 2016-04-25 NOTE — Progress Notes (Addendum)
Patient ID: Kara Harvey, female    DOB: [birth year]  Age: 75 y.o. MRN: [id number]  The patient is here for annual Medicare wellness examination and management of other chronic and acute problems.  Labial biopsy normal Dec 2017 no dysplasia 3d mammogram normal Feb 2017 colonoscopy virtual 2014   DISCUSSED son who lives with them disabled secondary to bipolar disorder with psychotic features.  Not currently on any psychotics for the past 10 months .Marland Kitchen Has difficulty adhering to lithium on a daily basis.  [place] .  hosp recently for GB surgery.  [facility] for [facility]CP     The risk factors are reflected in the social history.  The roster of all physicians providing medical care to patient - is listed in the Snapshot section of the chart.  Activities of daily living:  The patient is 100% independent in all ADLs: dressing, toileting, feeding as well as independent mobility  Home safety : The patient has smoke detectors in the home. They wear seatbelts.  There are no firearms at home. There is no violence in the home.   There is no risks for hepatitis, STDs or HIV. There is no   history of blood transfusion. They have no travel history to infectious disease endemic areas of the world.  The patient has seen their dentist in the last six month. They have seen their eye doctor in the last year. They admit to slight hearing difficulty with regard to whispered voices and some television programs.  They have deferred audiologic testing in the last year.  They do not  have excessive sun exposure. Discussed the need for sun protection: hats, long sleeves and use of sunscreen if there is significant sun exposure.   Diet: the importance of a healthy diet is discussed. They do have a healthy diet.  The benefits of regular aerobic exercise were discussed. She does not exercise regularly and has gained weight .   Depression screen: there are no signs or vegative  symptoms of depression- irritability, change in appetite, anhedonia, sadness/tearfullness.  Cognitive assessment: the patient manages all their financial and personal affairs and is actively engaged. They could relate day,date,year and events; recalled 2/3 objects at 3 minutes; performed clock-face test normally.  The following portions of the patient's history were reviewed and updated as appropriate: allergies, current medications, past family history, past medical history,  past surgical history, past social history  and problem list.  Visual acuity was not assessed per patient preference since she has regular follow up with her ophthalmologist. Hearing and body mass index were assessed and reviewed.   During the course of the visit the patient was educated and counseled about appropriate screening and preventive services including : fall prevention , diabetes screening, nutrition counseling, colorectal cancer screening, and recommended immunizations.    CC: The primary encounter diagnosis was Essential hypertension, benign. Diagnoses of Long-term use of high-risk medication, Easy bruising, Vitamin D deficiency, Hyperlipidemia LDL goal <100, Hyperlipidemia LDL goal <130, Medicare annual wellness visit, subsequent, Osteopenia of thigh, unspecified laterality, and Vaginal lesion were also pertinent to this visit.  History Kara Harvey has a past medical history of Hyperlipidemia and Hypertension.   She has a past surgical history that includes Fracture surgery (Left, 01/14/2011); Tonsilectomy, adenoidectomy, bilateral myringotomy and tubes (Bilateral, 1957); Tubal ligation (y-37); Breast surgery (Left, 1986); and Eye surgery (Left, 6wks ago).   Her family history includes Alzheimer's disease in her father; Arthritis in her father; Cancer  in her mother; Colon cancer in her paternal aunt; Hyperlipidemia in her mother.She reports that she quit smoking about 37 years ago. Her smoking use included Cigarettes.  She smoked 2.00 packs per day. She has never used smokeless tobacco. She reports that she drinks about 3.0 oz of alcohol per week . She reports that she does not use drugs.  Outpatient Medications Prior to Visit  Medication Sig Dispense Refill  . atorvastatin (LIPITOR) 20 MG tablet Take 1 tablet (20 mg total) by mouth daily. 90 tablet 1  . buPROPion (WELLBUTRIN SR) 150 MG 12 hr tablet Take 1 tablet (150 mg total) by mouth 2 (two) times daily. 180 tablet 3  . Cholecalciferol (VITAMIN D3) 1000 UNITS CAPS Take 1,000 Units by mouth daily.    . diphenhydrAMINE (BENADRYL) 2 % cream Apply 1 application topically 3 (three) times daily as needed for itching. Reported on 01/23/2016    . lisinopril-hydrochlorothiazide (PRINZIDE,ZESTORETIC) 10-12.5 MG tablet Take 1 tablet by mouth daily. 90 tablet 2  . valACYclovir (VALTREX) 1000 MG tablet take 2 tablets by mouth twice a day 20 tablet 5  . benzocaine-resorcinol (VAGISIL) 5-2 % vaginal cream Place 1 application vaginally at bedtime. Reported on 01/23/2016    . miconazole (MICOTIN) 2 % cream Apply 1 application topically 2 (two) times daily. Reported on 01/23/2016    . neomycin-polymyxin-pramoxine (NEOSPORIN PLUS) 1 % cream Apply topically 2 (two) times daily. Reported on 01/23/2016     No facility-administered medications prior to visit.     Review of Systems   Patient denies headache, fevers, malaise, unintentional weight loss, skin rash, eye pain, sinus congestion and sinus pain, sore throat, dysphagia,  hemoptysis , cough, dyspnea, wheezing, chest pain, palpitations, orthopnea, edema, abdominal pain, nausea, melena, diarrhea, constipation, flank pain, dysuria, hematuria, urinary  Frequency, nocturia, numbness, tingling, seizures,  Focal weakness, Loss of consciousness,  Tremor, insomnia, depression, anxiety, and suicidal ideation.      Objective:  BP 114/74   Pulse 72   Temp 97.5 F (36.4 C) (Oral)   Resp 12   Ht 5\' 3"  (1.6 m)   Wt 180 lb (81.6 kg)    SpO2 95%   BMI 31.89 kg/m   Physical Exam   General appearance: alert, cooperative and appears stated age Head: Normocephalic, without obvious abnormality, atraumatic Eyes: conjunctivae/corneas clear. PERRL, EOM's intact. Fundi benign. Ears: normal TM's and external ear canals both ears Nose: Nares normal. Septum midline. Mucosa normal. No drainage or sinus tenderness. Throat: lips, mucosa, and tongue normal; teeth and gums normal Neck: no adenopathy, no carotid bruit, no JVD, supple, symmetrical, trachea midline and thyroid not enlarged, symmetric, no tenderness/mass/nodules Lungs: clear to auscultation bilaterally Breasts: normal appearance, no masses or tenderness Heart: regular rate and rhythm, S1, S2 normal, no murmur, click, rub or gallop Abdomen: soft, non-tender; bowel sounds normal; no masses,  no organomegaly Extremities: extremities normal, atraumatic, no cyanosis or edema Pulses: 2+ and symmetric Skin: Skin color, texture, turgor normal. No rashes or lesions Neurologic: Alert and oriented X 3, normal strength and tone. Normal symmetric reflexes. Normal coordination and gait.     Assessment & Plan:   Problem List Items Addressed This Visit    Essential hypertension, benign - Primary    Well controlled on current regimen. Renal function stable, mild hyponatremia is stable and likely related touse of hctz,  no changes today.  Lab Results  Component Value Date   CREATININE 0.77 04/25/2016   Lab Results  Component Value Date  NA 132 (L) 04/25/2016   K 3.5 04/25/2016   CL 98 04/25/2016   CO2 26 04/25/2016         Relevant Orders   Comprehensive metabolic panel (Completed)   Hyperlipidemia LDL goal <130    Using the Framingham risk calculator,  her 10 year risk of coronary artery disease  Was elevated and is now 6.8%. Continue atorvastatin.   Lab Results  Component Value Date   CHOL 176 04/25/2016   HDL 78.80 04/25/2016   LDLCALC 79 04/25/2016   LDLDIRECT  135.1 09/03/2013   TRIG 92.0 04/25/2016   CHOLHDL 2 04/25/2016   Lab Results  Component Value Date   ALT 16 04/25/2016   AST 16 04/25/2016   ALKPHOS 66 04/25/2016   BILITOT 0.4 04/25/2016         Vitamin D deficiency    Recurrent despite use of a daily supplement.  Will increas daily supplement to 2,000 IUs of D3      Relevant Orders   VITAMIN D 25 Hydroxy (Vit-D Deficiency, Fractures) (Completed)   Medicare annual wellness visit, subsequent    Annual Medicare wellness  exam was done as well as a comprehensive physical exam and management of acute and chronic conditions .  During the course of the visit the patient was educated and counseled about appropriate screening and preventive services including : fall prevention , diabetes screening, nutrition counseling, colorectal cancer screening, and recommended immunizations.  Printed recommendations for health maintenance screenings was given.       Vaginal lesion    benign ,  By biopsy Dec 2016.        Osteopenia     Bone Density scores received, she has osteopenia,  Moderate.  Would repeat in 2 years and consider therapy then if there is a significant change. Continue calcium, vitamin d and weight bearing exercise on a regular basis.       Long-term use of high-risk medication   Relevant Orders   Comprehensive metabolic panel (Completed)    Other Visit Diagnoses    Easy bruising       Relevant Orders   TSH (Completed)   CBC with Differential/Platelet (Completed)   Hyperlipidemia LDL goal <100       Relevant Orders   Lipid panel (Completed)      I have discontinued Kara Harvey's miconazole, neomycin-polymyxin-pramoxine, and benzocaine-resorcinol. I am also having her maintain her Vitamin D3, diphenhydrAMINE, valACYclovir, lisinopril-hydrochlorothiazide, atorvastatin, and buPROPion.  No orders of the defined types were placed in this encounter.   Medications Discontinued During This Encounter  Medication Reason  .  benzocaine-resorcinol (VAGISIL) 5-2 % vaginal cream Completed Course  . miconazole (MICOTIN) 2 % cream Completed Course  . neomycin-polymyxin-pramoxine (NEOSPORIN PLUS) 1 % cream Completed Course    Follow-up: Return in about 6 months (around 10/24/2016).   Crecencio Mc, MD

## 2016-04-25 NOTE — Patient Instructions (Signed)
[phone]!!!!   Menopause is a normal process in which your reproductive ability comes to an end. This process happens gradually over a span of months to years, usually between the ages of 48 and 102. Menopause is complete when you have missed 12 consecutive menstrual periods. It is important to talk with your health care provider about some of the most common conditions that affect postmenopausal women, such as heart disease, cancer, and bone loss (osteoporosis). Adopting a healthy lifestyle and getting preventive care can help to promote your health and wellness. Those actions can also lower your chances of developing some of these common conditions. WHAT SHOULD I KNOW ABOUT MENOPAUSE? During menopause, you may experience a number of symptoms, such as:  Moderate-to-severe hot flashes.  Night sweats.  Decrease in sex drive.  Mood swings.  Headaches.  Tiredness.  Irritability.  Memory problems.  Insomnia. Choosing to treat or not to treat menopausal changes is an individual decision that you make with your health care provider. WHAT SHOULD I KNOW ABOUT HORMONE REPLACEMENT THERAPY AND SUPPLEMENTS? Hormone therapy products are effective for treating symptoms that are associated with menopause, such as hot flashes and night sweats. Hormone replacement carries certain risks, especially as you become older. If you are thinking about using estrogen or estrogen with progestin treatments, discuss the benefits and risks with your health care provider. WHAT SHOULD I KNOW ABOUT HEART DISEASE AND STROKE? Heart disease, heart attack, and stroke become more likely as you age. This may be due, in part, to the hormonal changes that your body experiences during menopause. These can affect how your body processes dietary fats, triglycerides, and cholesterol. Heart attack and stroke are both medical emergencies. There are many things that you can do to help prevent heart disease and stroke:  Have your  blood pressure checked at least every 1-2 years. High blood pressure causes heart disease and increases the risk of stroke.  If you are 73-34 years old, ask your health care provider if you should take aspirin to prevent a heart attack or a stroke.  Do not use any tobacco products, including cigarettes, chewing tobacco, or electronic cigarettes. If you need help quitting, ask your health care provider.  It is important to eat a healthy diet and maintain a healthy weight.  Be sure to include plenty of vegetables, fruits, low-fat dairy products, and lean protein.  Avoid eating foods that are high in solid fats, added sugars, or salt (sodium).  Get regular exercise. This is one of the most important things that you can do for your health.  Try to exercise for at least 150 minutes each week. The type of exercise that you do should increase your heart rate and make you sweat. This is known as moderate-intensity exercise.  Try to do strengthening exercises at least twice each week. Do these in addition to the moderate-intensity exercise.  Know your numbers.Ask your health care provider to check your cholesterol and your blood glucose. Continue to have your blood tested as directed by your health care provider. WHAT SHOULD I KNOW ABOUT CANCER SCREENING? There are several types of cancer. Take the following steps to reduce your risk and to catch any cancer development as early as possible. Breast Cancer  Practice breast self-awareness.  This means understanding how your breasts normally appear and feel.  It also means doing regular breast self-exams. Let your health care provider know about any changes, no matter how small.  If you are 40 or older,  have a clinician do a breast exam (clinical breast exam or CBE) every year. Depending on your age, family history, and medical history, it may be recommended that you also have a yearly breast X-ray (mammogram).  If you have a family history of  breast cancer, talk with your health care provider about genetic screening.  If you are at high risk for breast cancer, talk with your health care provider about having an MRI and a mammogram every year.  Breast cancer (BRCA) gene test is recommended for women who have family members with BRCA-related cancers. Results of the assessment will determine the need for genetic counseling and BRCA1 and for BRCA2 testing. BRCA-related cancers include these types:  Breast. This occurs in males or females.  Ovarian.  Tubal. This may also be called fallopian tube cancer.  Cancer of the abdominal or pelvic lining (peritoneal cancer).  Prostate.  Pancreatic. Cervical, Uterine, and Ovarian Cancer Your health care provider may recommend that you be screened regularly for cancer of the pelvic organs. These include your ovaries, uterus, and vagina. This screening involves a pelvic exam, which includes checking for microscopic changes to the surface of your cervix (Pap test).  For women ages 21-65, health care providers may recommend a pelvic exam and a Pap test every three years. For women ages 59-65, they may recommend the Pap test and pelvic exam, combined with testing for human papilloma virus (HPV), every five years. Some types of HPV increase your risk of cervical cancer. Testing for HPV may also be done on women of any age who have unclear Pap test results.  Other health care providers may not recommend any screening for nonpregnant women who are considered low risk for pelvic cancer and have no symptoms. Ask your health care provider if a screening pelvic exam is right for you.  If you have had past treatment for cervical cancer or a condition that could lead to cancer, you need Pap tests and screening for cancer for at least 20 years after your treatment. If Pap tests have been discontinued for you, your risk factors (such as having a new sexual partner) need to be reassessed to determine if you  should start having screenings again. Some women have medical problems that increase the chance of getting cervical cancer. In these cases, your health care provider may recommend that you have screening and Pap tests more often.  If you have a family history of uterine cancer or ovarian cancer, talk with your health care provider about genetic screening.  If you have vaginal bleeding after reaching menopause, tell your health care provider.  There are currently no reliable tests available to screen for ovarian cancer. Lung Cancer Lung cancer screening is recommended for adults 74-3 years old who are at high risk for lung cancer because of a history of smoking. A yearly low-dose CT scan of the lungs is recommended if you:  Currently smoke.  Have a history of at least 30 pack-years of smoking and you currently smoke or have quit within the past 15 years. A pack-year is smoking an average of one pack of cigarettes per day for one year. Yearly screening should:  Continue until it has been 15 years since you quit.  Stop if you develop a health problem that would prevent you from having lung cancer treatment. Colorectal Cancer  This type of cancer can be detected and can often be prevented.  Routine colorectal cancer screening usually begins at age 36 and continues through age  75.  If you have risk factors for colon cancer, your health care provider may recommend that you be screened at an earlier age.  If you have a family history of colorectal cancer, talk with your health care provider about genetic screening.  Your health care provider may also recommend using home test kits to check for hidden blood in your stool.  A small camera at the end of a tube can be used to examine your colon directly (sigmoidoscopy or colonoscopy). This is done to check for the earliest forms of colorectal cancer.  Direct examination of the colon should be repeated every 5-10 years until age 16. However, if  early forms of precancerous polyps or small growths are found or if you have a family history or genetic risk for colorectal cancer, you may need to be screened more often. Skin Cancer  Check your skin from head to toe regularly.  Monitor any moles. Be sure to tell your health care provider:  About any new moles or changes in moles, especially if there is a change in a mole's shape or color.  If you have a mole that is larger than the size of a pencil eraser.  If any of your family members has a history of skin cancer, especially at a young age, talk with your health care provider about genetic screening.  Always use sunscreen. Apply sunscreen liberally and repeatedly throughout the day.  Whenever you are outside, protect yourself by wearing long sleeves, pants, a wide-brimmed hat, and sunglasses. WHAT SHOULD I KNOW ABOUT OSTEOPOROSIS? Osteoporosis is a condition in which bone destruction happens more quickly than new bone creation. After menopause, you may be at an increased risk for osteoporosis. To help prevent osteoporosis or the bone fractures that can happen because of osteoporosis, the following is recommended:  If you are 73-74 years old, get at least 1,000 mg of calcium and at least 600 mg of vitamin D per day.  If you are older than age 68 but younger than age 6, get at least 1,200 mg of calcium and at least 600 mg of vitamin D per day.  If you are older than age 34, get at least 1,200 mg of calcium and at least 800 mg of vitamin D per day. Smoking and excessive alcohol intake increase the risk of osteoporosis. Eat foods that are rich in calcium and vitamin D, and do weight-bearing exercises several times each week as directed by your health care provider. WHAT SHOULD I KNOW ABOUT HOW MENOPAUSE AFFECTS Kara Harvey? Depression may occur at any age, but it is more common as you become older. Common symptoms of depression include:  Low or sad mood.  Changes in sleep  patterns.  Changes in appetite or eating patterns.  Feeling an overall lack of motivation or enjoyment of activities that you previously enjoyed.  Frequent crying spells. Talk with your health care provider if you think that you are experiencing depression. WHAT SHOULD I KNOW ABOUT IMMUNIZATIONS? It is important that you get and maintain your immunizations. These include:  Tetanus, diphtheria, and pertussis (Tdap) booster vaccine.  Influenza every year before the flu season begins.  Pneumonia vaccine.  Shingles vaccine. Your health care provider may also recommend other immunizations.   This information is not intended to replace advice given to you by your health care provider. Make sure you discuss any questions you have with your health care provider.   Document Released: 08/24/2005 Document Revised: 07/23/2014 Document Reviewed: 03/04/2014 Elsevier  Interactive Patient Education [employer].

## 2016-04-25 NOTE — Progress Notes (Signed)
Pre-visit discussion using our clinic review tool. No additional management support is needed unless otherwise documented below in the visit note.  

## 2016-04-26 DIAGNOSIS — M8580 Other specified disorders of bone density and structure, unspecified site: Secondary | ICD-10-CM | POA: Insufficient documentation

## 2016-04-26 NOTE — Assessment & Plan Note (Addendum)
Well controlled on current regimen. Renal function stable, mild hyponatremia is stable and likely related touse of hctz,  no changes today.  Lab Results  Component Value Date   CREATININE 0.77 04/25/2016   Lab Results  Component Value Date   NA 132 (L) 04/25/2016   K 3.5 04/25/2016   CL 98 04/25/2016   CO2 26 04/25/2016

## 2016-04-26 NOTE — Assessment & Plan Note (Addendum)
Using the Framingham risk calculator,  her 10 year risk of coronary artery disease  Was elevated and is now 6.8%. Continue atorvastatin.   Lab Results  Component Value Date   CHOL 176 04/25/2016   HDL 78.80 04/25/2016   LDLCALC 79 04/25/2016   LDLDIRECT 135.1 09/03/2013   TRIG 92.0 04/25/2016   CHOLHDL 2 04/25/2016   Lab Results  Component Value Date   ALT 16 04/25/2016   AST 16 04/25/2016   ALKPHOS 66 04/25/2016   BILITOT 0.4 04/25/2016

## 2016-04-26 NOTE — Assessment & Plan Note (Signed)
benign ,  By biopsy Dec 2016.

## 2016-04-26 NOTE — Assessment & Plan Note (Signed)

## 2016-04-26 NOTE — Assessment & Plan Note (Addendum)
Bone Density scores received, she has osteopenia,  Moderate.  Would repeat in 2 years and consider therapy then if there is a significant change. Continue calcium, vitamin d and weight bearing exercise on a regular basis.  

## 2016-04-29 DIAGNOSIS — E559 Vitamin D deficiency, unspecified: Secondary | ICD-10-CM | POA: Insufficient documentation

## 2016-04-29 NOTE — Assessment & Plan Note (Signed)
Recurrent despite use of a daily supplement.  Will increas daily supplement to 2,000 IUs of D3

## 2016-05-01 ENCOUNTER — Encounter: Payer: Self-pay | Admitting: *Deleted

## 2016-05-22 DIAGNOSIS — L821 Other seborrheic keratosis: Secondary | ICD-10-CM | POA: Diagnosis not present

## 2016-05-22 DIAGNOSIS — D225 Melanocytic nevi of trunk: Secondary | ICD-10-CM | POA: Diagnosis not present

## 2016-05-22 DIAGNOSIS — L814 Other melanin hyperpigmentation: Secondary | ICD-10-CM | POA: Diagnosis not present

## 2016-05-22 DIAGNOSIS — L820 Inflamed seborrheic keratosis: Secondary | ICD-10-CM | POA: Diagnosis not present

## 2016-05-22 DIAGNOSIS — D1801 Hemangioma of skin and subcutaneous tissue: Secondary | ICD-10-CM | POA: Diagnosis not present

## 2016-06-05 ENCOUNTER — Other Ambulatory Visit: Payer: Self-pay | Admitting: Internal Medicine

## 2016-07-02 ENCOUNTER — Other Ambulatory Visit: Payer: Self-pay | Admitting: Internal Medicine

## 2016-10-24 ENCOUNTER — Encounter: Payer: Self-pay | Admitting: Internal Medicine

## 2016-10-24 ENCOUNTER — Ambulatory Visit (INDEPENDENT_AMBULATORY_CARE_PROVIDER_SITE_OTHER): Payer: Medicare Other | Admitting: Internal Medicine

## 2016-10-24 VITALS — BP 100/62 | HR 82 | Temp 98.0°F | Resp 16 | Ht 63.0 in | Wt 182.2 lb

## 2016-10-24 DIAGNOSIS — I10 Essential (primary) hypertension: Secondary | ICD-10-CM

## 2016-10-24 DIAGNOSIS — Z1239 Encounter for other screening for malignant neoplasm of breast: Secondary | ICD-10-CM

## 2016-10-24 DIAGNOSIS — Z1231 Encounter for screening mammogram for malignant neoplasm of breast: Secondary | ICD-10-CM | POA: Diagnosis not present

## 2016-10-24 DIAGNOSIS — E785 Hyperlipidemia, unspecified: Secondary | ICD-10-CM

## 2016-10-24 DIAGNOSIS — E559 Vitamin D deficiency, unspecified: Secondary | ICD-10-CM

## 2016-10-24 DIAGNOSIS — F32A Depression, unspecified: Secondary | ICD-10-CM

## 2016-10-24 DIAGNOSIS — F329 Major depressive disorder, single episode, unspecified: Secondary | ICD-10-CM

## 2016-10-24 DIAGNOSIS — Z79899 Other long term (current) drug therapy: Secondary | ICD-10-CM

## 2016-10-24 LAB — COMPREHENSIVE METABOLIC PANEL
ALT: 14 U/L (ref 0–35)
AST: 14 U/L (ref 0–37)
Albumin: 4.1 g/dL (ref 3.5–5.2)
Alkaline Phosphatase: 68 U/L (ref 39–117)
BILIRUBIN TOTAL: 0.3 mg/dL (ref 0.2–1.2)
BUN: 21 mg/dL (ref 6–23)
CO2: 27 meq/L (ref 19–32)
CREATININE: 0.8 mg/dL (ref 0.40–1.20)
Calcium: 9.3 mg/dL (ref 8.4–10.5)
Chloride: 96 mEq/L (ref 96–112)
GFR: 74.47 mL/min (ref >60.00)
Glucose, Bld: 87 mg/dL (ref 70–99)
Potassium: 4 mEq/L (ref 3.5–5.1)
SODIUM: 132 meq/L — AB (ref 135–145)
TOTAL PROTEIN: 6.5 g/dL (ref 6.0–8.3)

## 2016-10-24 LAB — VITAMIN D 25 HYDROXY (VIT D DEFICIENCY, FRACTURES): VITD: 25.14 ng/mL — AB (ref 30.00–100.00)

## 2016-10-24 NOTE — Patient Instructions (Addendum)
No changes today  Mammogram ordered  Next DEXA due in Feb 2019   We will repeat your fasting labs PRIOR TO ANNUAL CPE IN [place]

## 2016-10-24 NOTE — Progress Notes (Signed)
Pre visit review using our clinic review tool, if applicable. No additional management support is needed unless otherwise documented below in the visit note. 

## 2016-10-24 NOTE — Progress Notes (Signed)
Subjective:  Patient ID: Kara Harvey, female    DOB: 01-21-1941  Age: 76 y.o. MRN: [id number]  CC: The primary encounter diagnosis was Breast cancer screening. Diagnoses of Long-term use of high-risk medication, Vitamin D deficiency, Essential hypertension, benign, Hyperlipidemia LDL goal <130, and Depressive disorder were also pertinent to this visit.  HPI CALY PELLUM presents for 6 month follow up on hyperlipidemia, vit d deficiency ,  Hypertension, . Obesity and .depression  Major depressive disorder:  Feels her symptoms are managed well with wellbutrin.  Notices a change if she forgets to take It for 2-3 days .  She adjusts her dose frequently,  ranges from 1/2 tablet daily to 2 daily   Feels good, no dozing during the day .   Takes sennakot s nightly for constipation   Has not exercised in 6 years since her left malleolar fracture; feels a nail protruding from lateral side.   Discussed colonosocpy 2014,  cologuard due in 2024  Mammogram ordered   Taking vitamin d 1000 to 2000 units daily   Osteopenia,  Stopped fosomax after 5 years of daily use over 10 years ago.  did not toelrate weekly use   t scores reviewed from most recent DEXA x-2.3 In Feb 2017    Outpatient Medications Prior to Visit  Medication Sig Dispense Refill  . atorvastatin (LIPITOR) 20 MG tablet take 1 tablet by mouth daily 90 tablet 1  . buPROPion (WELLBUTRIN SR) 150 MG 12 hr tablet Take 1 tablet (150 mg total) by mouth 2 (two) times daily. 180 tablet 3  . Cholecalciferol (VITAMIN D3) 1000 UNITS CAPS Take 1,000 Units by mouth daily.    . diphenhydrAMINE (BENADRYL) 2 % cream Apply 1 application topically 3 (three) times daily as needed for itching. Reported on 01/23/2016    . lisinopril-hydrochlorothiazide (PRINZIDE,ZESTORETIC) 10-12.5 MG tablet take 1 tablet by mouth once daily 90 tablet 1  . valACYclovir (VALTREX) 1000 MG tablet take 2 tablets by mouth twice a day 20 tablet 5   No  facility-administered medications prior to visit.     Review of Systems;  Patient denies headache, fevers, malaise, unintentional weight loss, skin rash, eye pain, sinus congestion and sinus pain, sore throat, dysphagia,  hemoptysis , cough, dyspnea, wheezing, chest pain, palpitations, orthopnea, edema, abdominal pain, nausea, melena, diarrhea, constipation, flank pain, dysuria, hematuria, urinary  Frequency, nocturia, numbness, tingling, seizures,  Focal weakness, Loss of consciousness,  Tremor, insomnia, depression, anxiety, and suicidal ideation.      Objective:  BP 100/62   Pulse 82   Temp 98 F (36.7 C) (Oral)   Resp 16   Ht 5\' 3"  (1.6 m)   Wt 182 lb 3.2 oz (82.6 kg)   SpO2 96%   BMI 32.28 kg/m   BP Readings from Last 3 Encounters:  10/24/16 100/62  04/25/16 114/74  01/23/16 110/66    Wt Readings from Last 3 Encounters:  10/24/16 182 lb 3.2 oz (82.6 kg)  04/25/16 180 lb (81.6 kg)  01/23/16 175 lb 8 oz (79.6 kg)    General appearance: alert, cooperative and appears stated age Ears: normal TM's and external ear canals both ears Throat: lips, mucosa, and tongue normal; teeth and gums normal Neck: no adenopathy, no carotid bruit, supple, symmetrical, trachea midline and thyroid not enlarged, symmetric, no tenderness/mass/nodules Back: symmetric, no curvature. ROM normal. No CVA tenderness. Lungs: clear to auscultation bilaterally Heart: regular rate and rhythm, S1, S2 normal, no murmur, click, rub or gallop Abdomen: soft,  non-tender; bowel sounds normal; no masses,  no organomegaly Pulses: 2+ and symmetric Skin: Skin color, texture, turgor normal. No rashes or lesions Lymph nodes: Cervical, supraclavicular, and axillary nodes normal.  No results found for: HGBA1C  Lab Results  Component Value Date   CREATININE 0.80 10/24/2016   CREATININE 0.77 04/25/2016   CREATININE 0.70 01/23/2016    Lab Results  Component Value Date   WBC 8.7 04/25/2016   HGB 12.9  04/25/2016   HCT 38.8 04/25/2016   PLT 310.0 04/25/2016   GLUCOSE 87 10/24/2016   CHOL 176 04/25/2016   TRIG 92.0 04/25/2016   HDL 78.80 04/25/2016   LDLDIRECT 135.1 09/03/2013   LDLCALC 79 04/25/2016   ALT 14 10/24/2016   AST 14 10/24/2016   NA 132 (L) 10/24/2016   K 4.0 10/24/2016   CL 96 10/24/2016   CREATININE 0.80 10/24/2016   BUN 21 10/24/2016   CO2 27 10/24/2016   TSH 1.72 04/25/2016   MICROALBUR 0.1 09/03/2013    Dg Bone Density  Result Date: 09/07/2015 EXAM: DUAL X-RAY ABSORPTIOMETRY (DXA) FOR BONE MINERAL DENSITY IMPRESSION: Referring Physician:  Crecencio Mc PATIENT: Name: Gaye, Scorza Patient ID: [id number] Birth Date: 05/30/1941 Height: 62.5 in. Sex: Female Measured: 09/07/2015 Weight: 177.0 lbs. Indications: Advanced Age, Caucasian, Estrogen Deficient, Family History of Osteoporosis, Height Loss (781.91), History of Fracture (Adult) (V15.51), Postmenopausal, Wellbutrin Fractures: Ankle Treatments: Vitamin D (E933.5) ASSESSMENT: The BMD measured at Femur Neck Right is 0.724 g/cm2 with a T-score of -2.3. This patient is considered to be osteopenic according to [facility]) criteria. Site Region Measured Date Measured Age YA BMD Significant CHANGE T-score DualFemur Neck Right 09/07/2015    75.1         -2.3    0.724 g/cm2 AP Spine  L1-L4      09/07/2015    [age]         -2.2    0.919 g/cm2 World Health Organization [facility]) criteria for post-menopausal, Caucasian Women: Normal       T-score at or above -1 SD Osteopenia   T-score between -1 and -2.5 SD Osteoporosis T-score at or below -2.5 SD RECOMMENDATION: [place] recommends that FDA-approved medical therapies be considered in postmenopausal women and men age 74 or older with a: 1. Hip or vertebral (clinical or morphometric) fracture. 2. T-score of <-2.5 at the spine or hip. 3. Ten-year fracture probability by FRAX of 3% or greater for hip fracture or 20% or greater for major  osteoporotic fracture. All treatment decisions require clinical judgment and consideration of individual patient factors, including patient preferences, co-morbidities, previous drug use, risk factors not captured in the FRAX model (e.g. falls, vitamin D deficiency, increased bone turnover, interval significant decline in bone density) and possible under - or over-estimation of fracture risk by FRAX. All patients should ensure an adequate intake of dietary calcium (1200 mg/d) and vitamin D (800 IU daily) unless contraindicated. FOLLOW-UP: People with diagnosed cases of osteoporosis or at high risk for fracture should have regular bone mineral density tests. For patients eligible for Medicare, routine testing is allowed once every 2 years. The testing frequency can be increased to one year for patients who have rapidly progressing disease, those who are receiving or discontinuing medical therapy to restore bone mass, or have additional risk factors. I have reviewed this report, and agree with the above findings. [facility] FRAX* 10-year Probability of Fracture Based on femoral neck BMD: DualFemur (Right) Major Osteoporotic Fracture: 20.9%  Hip Fracture:                5.1% Population:                  [place] (Caucasian) Risk Factors:                History of Fracture (Adult) (V15.51) *FRAX is a [place] [employer] for Metabolic Bone Disease, a [place] (WHO) [facility]. ASSESSMENT: The probability of a major osteoporotic fracture is 20.9 % within the next ten years. The probability of a hip fracture is 5.1% within the next ten years. Electronically Signed   By: David  Martinique M.D.   On: 09/07/2015 15:44   Mm Screening Breast Tomo Bilateral  Result Date: 09/07/2015 CLINICAL DATA:  Screening. EXAM: DIGITAL SCREENING BILATERAL MAMMOGRAM WITH 3D TOMO WITH CAD COMPARISON:  Previous exam(s). ACR Breast Density Category c: The breast tissue is  heterogeneously dense, which may obscure small masses. FINDINGS: There are no findings suspicious for malignancy. Images were processed with CAD. IMPRESSION: No mammographic evidence of malignancy. A result letter of this screening mammogram will be mailed directly to the patient. RECOMMENDATION: Screening mammogram in one year. (Code:SM-B-01Y) BI-RADS CATEGORY  1: Negative. Electronically Signed   By: Claudie Revering M.D.   On: 09/08/2015 17:45    Assessment & Plan:   Problem List Items Addressed This Visit    Depressive disorder    She notes continued need for wellbutrin. Refills given.       Essential hypertension, benign    Well controlled on current regimen. Renal function stable, no changes today.      Hyperlipidemia LDL goal <130    LDL and triglycerides are at goal on current medications. He has no side effects and liver enzymes are normal. No changes today  Lab Results  Component Value Date   CHOL 176 04/25/2016   HDL 78.80 04/25/2016   LDLCALC 79 04/25/2016   LDLDIRECT 135.1 09/03/2013   TRIG 92.0 04/25/2016   CHOLHDL 2 04/25/2016   Lab Results  Component Value Date   ALT 14 10/24/2016   AST 14 10/24/2016   ALKPHOS 68 10/24/2016   BILITOT 0.3 10/24/2016          Long-term use of high-risk medication   Relevant Orders   Comprehensive metabolic panel (Completed)   Vitamin D deficiency    Recurrent despite use of a daily supplement.  Will increase daily supplement to 2,000 IUs of D3      Relevant Orders   VITAMIN D 25 Hydroxy (Vit-D Deficiency, Fractures) (Completed)    Other Visit Diagnoses    Breast cancer screening    -  Primary   Relevant Orders   MM SCREENING BREAST TOMO BILATERAL      I am having Ms. Broughton maintain her Vitamin D3, diphenhydrAMINE, valACYclovir, buPROPion, atorvastatin, and lisinopril-hydrochlorothiazide.  No orders of the defined types were placed in this encounter.   There are no discontinued medications.  Follow-up: Return  in about 6 months (around 04/25/2017), or CPE/wellness.   Crecencio Mc, MD

## 2016-10-27 NOTE — Assessment & Plan Note (Signed)
Recurrent despite use of a daily supplement.  Will increase daily supplement to 2,000 IUs of D3

## 2016-10-27 NOTE — Assessment & Plan Note (Signed)
I have addressed  BMI and recommended a low glycemic index diet utilizing smaller more frequent meals to increase metabolism.  I have also recommended that patient start exercising with a goal of 30 minutes of aerobic exercise a minimum of 5 days per week.  

## 2016-10-27 NOTE — Assessment & Plan Note (Addendum)
Occurred in July 2013  Has not resumed exercise,  Now apprehensive because she feels a nail protruding  .  Advised to follow up with Ortho

## 2016-10-27 NOTE — Assessment & Plan Note (Signed)
Well controlled on current regimen. Renal function stable, no changes today. 

## 2016-10-27 NOTE — Assessment & Plan Note (Signed)
She notes continued need for wellbutrin. Refills given.  

## 2016-10-27 NOTE — Assessment & Plan Note (Signed)
LDL and triglycerides are at goal on current medications. He has no side effects and liver enzymes are normal. No changes today  Lab Results  Component Value Date   CHOL 176 04/25/2016   HDL 78.80 04/25/2016   LDLCALC 79 04/25/2016   LDLDIRECT 135.1 09/03/2013   TRIG 92.0 04/25/2016   CHOLHDL 2 04/25/2016   Lab Results  Component Value Date   ALT 14 10/24/2016   AST 14 10/24/2016   ALKPHOS 68 10/24/2016   BILITOT 0.3 10/24/2016

## 2016-10-29 ENCOUNTER — Telehealth: Payer: Self-pay | Admitting: Internal Medicine

## 2016-10-29 NOTE — Telephone Encounter (Signed)
Pt called back returning your call. Thank you!  Call pt @ [phone]

## 2016-10-29 NOTE — Telephone Encounter (Signed)
Patient advised of lab results and verbalized 10/24/16

## 2016-11-08 ENCOUNTER — Other Ambulatory Visit: Payer: Self-pay | Admitting: Internal Medicine

## 2016-11-08 DIAGNOSIS — Z1231 Encounter for screening mammogram for malignant neoplasm of breast: Secondary | ICD-10-CM

## 2016-11-29 ENCOUNTER — Ambulatory Visit: Payer: Self-pay

## 2016-12-04 ENCOUNTER — Ambulatory Visit (INDEPENDENT_AMBULATORY_CARE_PROVIDER_SITE_OTHER): Payer: Self-pay | Admitting: Orthopaedic Surgery

## 2016-12-10 ENCOUNTER — Other Ambulatory Visit: Payer: Self-pay | Admitting: Internal Medicine

## 2016-12-11 ENCOUNTER — Encounter: Payer: Self-pay | Admitting: Family Medicine

## 2016-12-11 ENCOUNTER — Ambulatory Visit (INDEPENDENT_AMBULATORY_CARE_PROVIDER_SITE_OTHER): Payer: Medicare Other | Admitting: Family Medicine

## 2016-12-11 DIAGNOSIS — L0390 Cellulitis, unspecified: Secondary | ICD-10-CM | POA: Insufficient documentation

## 2016-12-11 DIAGNOSIS — L03113 Cellulitis of right upper limb: Secondary | ICD-10-CM | POA: Diagnosis not present

## 2016-12-11 MED ORDER — DOXYCYCLINE HYCLATE 100 MG PO TABS: 100.0000 mg | ORAL_TABLET | Freq: Two times a day (BID) | ORAL | 0 refills | Status: DC

## 2016-12-11 NOTE — Patient Instructions (Addendum)
Antibiotic as prescribed.  Call with concerns.  Take care  Dr. Adylee Leonardo  

## 2016-12-11 NOTE — Progress Notes (Signed)
Subjective:  Patient ID: Kara Harvey, female    DOB: [birth year]  Age: 76 y.o. MRN: [id number]  CC: Cat scratches, hand swelling/redness  HPI:  76 year old female presents with the above complaints.  Patient was handling a stray cat on Sunday. She suddenly suffered several scratches. Since that period of time she's had swelling and redness of her right hand. Moderate in severity. Redness has been worsening. No associated fever. She does not recall any discrete bites. No known exacerbating or relieving factors. No other associated symptoms. No other complaints or concerns at this time.  Social Hx   Social History   Social History  . Marital status: Married    Spouse name: N/A  . Number of children: N/A  . Years of education: N/A   Social History Main Topics  . Smoking status: Former Smoker    Packs/day: 2.00    Types: Cigarettes    Quit date: 03/28/1979  . Smokeless tobacco: Never Used  . Alcohol use 3.0 oz/week    5 Glasses of wine per week  . Drug use: No  . Sexual activity: Yes   Other Topics Concern  . None   Social History Narrative  . None    Review of Systems  Constitutional: Negative for fever.  Skin:       Scratches/redness right hand.   Objective:  BP 126/74 (BP Location: Left Arm, Patient Position: Sitting, Cuff Size: Normal)   Pulse 78   Temp 97.5 F (36.4 C) (Oral)   Resp 16   Wt 182 lb 2 oz (82.6 kg)   SpO2 98%   BMI 32.26 kg/m   BP/Weight 12/11/2016 10/24/2016 26/37/8588  Systolic BP 502 774 128  Diastolic BP 74 62 74  Wt. (Lbs) 182.13 182.2 180  BMI 32.26 32.28 31.89    Physical Exam  Constitutional: She is oriented to person, place, and time. She appears well-developed and well-nourished.  Cardiovascular: Normal rate and regular rhythm.   Pulmonary/Chest: Effort normal and breath sounds normal.  Neurological: She is alert and oriented to person, place, and time.  Skin:  Right hand/wrist - redness noted on the dorsum of the hand and  wrist. Several scratches noted with overlying erythema. Mild swelling.  Psychiatric: She has a normal mood and affect.  Vitals reviewed.   Lab Results  Component Value Date   WBC 8.7 04/25/2016   HGB 12.9 04/25/2016   HCT 38.8 04/25/2016   PLT 310.0 04/25/2016   GLUCOSE 87 10/24/2016   CHOL 176 04/25/2016   TRIG 92.0 04/25/2016   HDL 78.80 04/25/2016   LDLDIRECT 135.1 09/03/2013   LDLCALC 79 04/25/2016   ALT 14 10/24/2016   AST 14 10/24/2016   NA 132 (L) 10/24/2016   K 4.0 10/24/2016   CL 96 10/24/2016   CREATININE 0.80 10/24/2016   BUN 21 10/24/2016   CO2 27 10/24/2016   TSH 1.72 04/25/2016   MICROALBUR 0.1 09/03/2013    Assessment & Plan:   Problem List Items Addressed This Visit    Cellulitis    New problem. Does not appear clinically to be some Scratch disease. No discrete bites. Treating with doxy.         Meds ordered this encounter  Medications  . doxycycline (VIBRA-TABS) 100 MG tablet    Sig: Take 1 tablet (100 mg total) by mouth 2 (two) times daily.    Dispense:  20 tablet    Refill:  0    Follow-up: PRN  Thersa Salt DO  [facility]

## 2016-12-11 NOTE — Assessment & Plan Note (Signed)
New problem. Does not appear clinically to be some Scratch disease. No discrete bites. Treating with doxy.

## 2016-12-14 ENCOUNTER — Ambulatory Visit: Payer: Self-pay

## 2016-12-18 ENCOUNTER — Ambulatory Visit: Payer: Self-pay

## 2016-12-28 ENCOUNTER — Ambulatory Visit (INDEPENDENT_AMBULATORY_CARE_PROVIDER_SITE_OTHER): Payer: Medicare Other

## 2016-12-28 ENCOUNTER — Encounter (INDEPENDENT_AMBULATORY_CARE_PROVIDER_SITE_OTHER): Payer: Self-pay | Admitting: Orthopaedic Surgery

## 2016-12-28 ENCOUNTER — Ambulatory Visit (INDEPENDENT_AMBULATORY_CARE_PROVIDER_SITE_OTHER): Payer: Medicare Other | Admitting: Orthopaedic Surgery

## 2016-12-28 VITALS — BP 129/71 | HR 84 | Ht 63.0 in | Wt 183.0 lb

## 2016-12-28 DIAGNOSIS — M25572 Pain in left ankle and joints of left foot: Secondary | ICD-10-CM

## 2016-12-28 NOTE — Progress Notes (Signed)
Office Visit Note   Patient: Kara Harvey           Date of Birth: 26-Sep-1940           MRN: [id number] Visit Date: 12/28/2016              Requested by: Crecencio Mc, MD [address] [place], [place] [address] PCP: Crecencio Mc, MD   Assessment & Plan: Visit Diagnoses:  1. Pain in left ankle and joints of left foot     Plan: Bimalleolar ankle fracture is completely healed now 6 years out. I discussed with her that although the last 2 screws had backed up return her to they're not causing any problems and nothing needs to be done. No arthritic changes are present in the ankle and she can return on when necessary basis. Patient is happy that everything is good and is happy with the surgical result. Return when necessary  Follow-Up Instructions: No Follow-up on file.   Orders:  Orders Placed This Encounter  Procedures  . XR Ankle Complete Left   No orders of the defined types were placed in this encounter.     Procedures: No procedures performed   Clinical Data: No additional findings.   Subjective: Chief Complaint  Patient presents with  . Left Ankle - Pain    HPI 76 year old female had the ORIF left ankle 2012 by Dr. Lorin Mercy. She noticed some prominence of the screws distally that have not been painful she walks her play she one states she doesn't have any pain or instability about was concerns that the screw was palpable that something might be wrong.  Review of Systems 14 part review of systems updated positive for hypertension, hyperlipidemia, history of depression vitamin D deficiency, osteopenia, previous left malleolar ankle fracture otherwise negative as it pertains history of present illness.   Objective: Vital Signs: BP 129/71   Pulse 84   Ht 5\' 3"  (1.6 m)   Wt 183 lb (83 kg)   BMI 32.42 kg/m   Physical Exam  Constitutional: She is oriented to person, place, and time. She appears well-developed.  HENT:  Head: Normocephalic.    Right Ear: External ear normal.  Left Ear: External ear normal.  Eyes: Pupils are equal, round, and reactive to light.  Neck: No tracheal deviation present. No thyromegaly present.  Cardiovascular: Normal rate.   Pulmonary/Chest: Effort normal.  Abdominal: Soft.  Musculoskeletal:  Good range of motion of the hips these reach full extension. Distal 4 screws are palpable 2 most distal screws are prominent but are not tender nonpainful incisions well-healed she has good ankle range of motion normal gait no limping posterior tibial peroneal function is normal gastrocsoleus is normal.  Neurological: She is alert and oriented to person, place, and time.  Skin: Skin is warm and dry.  Psychiatric: She has a normal mood and affect. Her behavior is normal.    Ortho Exam  Specialty Comments:  No specialty comments available.  Imaging: Xr Ankle Complete Left  Result Date: 12/28/2016 Three-view x-rays left ankle obtained this shows previous ORIF bimalleolar ankle fracture. On 2 screws in the one third tubular plate had backed out a couple turns. Fracture is healed no arthritic changes in the ankle. Impression: Healed left ankle fracture.    PMFS History: Patient Active Problem List   Diagnosis Date Noted  . Cellulitis 12/11/2016  . Vitamin D deficiency 04/29/2016  . Osteopenia 04/26/2016  . Long-term use of high-risk medication  01/25/2016  . Depressive disorder 01/25/2016  . Vaginal lesion 06/09/2015  . Medicare annual wellness visit, subsequent 03/06/2014  . Closed malleolar fracture, unspecified laterality, with routine healing, subsequent encounter 03/04/2014  . Cataract extraction status of left eye 03/04/2014  . Obesity 09/05/2013  . Urge incontinence 03/27/2013  . Essential hypertension, benign 03/27/2013  . Hyperlipidemia LDL goal <130 03/27/2013   Past Medical History:  Diagnosis Date  . Hyperlipidemia   . Hypertension     Family History  Problem Relation Age of Onset   . Hyperlipidemia Mother   . Cancer Mother   . Arthritis Father   . Alzheimer's disease Father   . Colon cancer Paternal Aunt     Past Surgical History:  Procedure Laterality Date  . BREAST SURGERY Left 1986   for false positive/biopsy  . EYE SURGERY Left 6wks ago   cataract  . FRACTURE SURGERY Left 01/14/2011   ankle  . TONSILECTOMY, ADENOIDECTOMY, BILATERAL MYRINGOTOMY AND TUBES Bilateral 1957   does not believe adenoids were removed  . TUBAL LIGATION  y-37   Social History   Occupational History  . Not on file.   Social History Main Topics  . Smoking status: Former Smoker    Packs/day: 2.00    Types: Cigarettes    Quit date: 03/28/1979  . Smokeless tobacco: Never Used  . Alcohol use 3.0 oz/week    5 Glasses of wine per week  . Drug use: No  . Sexual activity: Yes

## 2016-12-31 ENCOUNTER — Ambulatory Visit
Admission: RE | Admit: 2016-12-31 | Discharge: 2016-12-31 | Disposition: A | Discharge status: Home / Self Care | Payer: Medicare Other | Source: Ambulatory Visit | Attending: Internal Medicine | Admitting: Internal Medicine

## 2016-12-31 DIAGNOSIS — Z1231 Encounter for screening mammogram for malignant neoplasm of breast: Secondary | ICD-10-CM

## 2017-02-21 ENCOUNTER — Other Ambulatory Visit: Payer: Self-pay | Admitting: Internal Medicine

## 2017-03-07 ENCOUNTER — Telehealth: Payer: Self-pay

## 2017-03-07 NOTE — Telephone Encounter (Signed)
Left patient a message to call back about appointment time.

## 2017-03-08 NOTE — Telephone Encounter (Signed)
Patient is aware of lab appointment 04/24/2017

## 2017-04-23 ENCOUNTER — Telehealth: Payer: Self-pay | Admitting: Radiology

## 2017-04-23 DIAGNOSIS — R5383 Other fatigue: Secondary | ICD-10-CM

## 2017-04-23 DIAGNOSIS — E785 Hyperlipidemia, unspecified: Secondary | ICD-10-CM

## 2017-04-23 DIAGNOSIS — E559 Vitamin D deficiency, unspecified: Secondary | ICD-10-CM

## 2017-04-23 DIAGNOSIS — Z79899 Other long term (current) drug therapy: Secondary | ICD-10-CM

## 2017-04-23 NOTE — Addendum Note (Signed)
Addended by: Crecencio Mc on: 04/23/2017 11:10 AM   Modules accepted: Orders

## 2017-04-23 NOTE — Telephone Encounter (Signed)
Pt coming in for labs tomorrow, please place future orders. Thank you.  

## 2017-04-24 ENCOUNTER — Other Ambulatory Visit (INDEPENDENT_AMBULATORY_CARE_PROVIDER_SITE_OTHER): Payer: Medicare Other

## 2017-04-24 DIAGNOSIS — E785 Hyperlipidemia, unspecified: Secondary | ICD-10-CM

## 2017-04-24 DIAGNOSIS — Z79899 Other long term (current) drug therapy: Secondary | ICD-10-CM | POA: Diagnosis not present

## 2017-04-24 DIAGNOSIS — E559 Vitamin D deficiency, unspecified: Secondary | ICD-10-CM

## 2017-04-24 DIAGNOSIS — R5383 Other fatigue: Secondary | ICD-10-CM | POA: Diagnosis not present

## 2017-04-24 LAB — CBC WITH DIFFERENTIAL/PLATELET
BASOS ABS: 0.1 10*3/uL (ref 0.0–0.1)
Basophils Relative: 0.8 % (ref 0.0–3.0)
EOS ABS: 0.2 10*3/uL (ref 0.0–0.7)
Eosinophils Relative: 2.8 % (ref 0.0–5.0)
HCT: 39.6 % (ref 36.0–46.0)
Hemoglobin: 13.2 g/dL (ref 12.0–15.0)
LYMPHS ABS: 2.5 10*3/uL (ref 0.7–4.0)
Lymphocytes Relative: 32.9 % (ref 12.0–46.0)
MCHC: 33.3 g/dL (ref 30.0–36.0)
MCV: 90.3 fl (ref 78.0–100.0)
MONO ABS: 0.7 10*3/uL (ref 0.1–1.0)
MONOS PCT: 9.5 % (ref 3.0–12.0)
NEUTROS ABS: 4.1 10*3/uL (ref 1.4–7.7)
NEUTROS PCT: 54 % (ref 43.0–77.0)
PLATELETS: 339 10*3/uL (ref 150.0–400.0)
RBC: 4.38 Mil/uL (ref 3.87–5.11)
RDW: 14.4 % (ref 11.5–15.5)
WBC: 7.5 10*3/uL (ref 4.0–10.5)

## 2017-04-24 LAB — COMPREHENSIVE METABOLIC PANEL
ALBUMIN: 4.1 g/dL (ref 3.5–5.2)
ALK PHOS: 67 U/L (ref 39–117)
ALT: 15 U/L (ref 0–35)
AST: 15 U/L (ref 0–37)
BUN: 13 mg/dL (ref 6–23)
CALCIUM: 9.5 mg/dL (ref 8.4–10.5)
CHLORIDE: 99 meq/L (ref 96–112)
CO2: 29 mEq/L (ref 19–32)
CREATININE: 0.78 mg/dL (ref 0.40–1.20)
GFR: 76.57 mL/min (ref >60.00)
Glucose, Bld: 108 mg/dL — ABNORMAL HIGH (ref 70–99)
Potassium: 3.9 mEq/L (ref 3.5–5.1)
Sodium: 135 mEq/L (ref 135–145)
TOTAL PROTEIN: 6.8 g/dL (ref 6.0–8.3)
Total Bilirubin: 0.5 mg/dL (ref 0.2–1.2)

## 2017-04-24 LAB — LIPID PANEL
Cholesterol: 181 mg/dL (ref 0–200)
HDL: 79.1 mg/dL (ref >39.00)
LDL Cholesterol: 84 mg/dL (ref 0–99)
NonHDL: 101.96
TRIGLYCERIDES: 90 mg/dL (ref 0.0–149.0)
Total CHOL/HDL Ratio: 2
VLDL: 18 mg/dL (ref 0.0–40.0)

## 2017-04-24 LAB — VITAMIN D 25 HYDROXY (VIT D DEFICIENCY, FRACTURES): VITD: 34.36 ng/mL (ref 30.00–100.00)

## 2017-04-24 LAB — TSH: TSH: 5.02 u[IU]/mL — AB (ref 0.35–4.50)

## 2017-04-26 ENCOUNTER — Ambulatory Visit (INDEPENDENT_AMBULATORY_CARE_PROVIDER_SITE_OTHER): Payer: Medicare Other | Admitting: Internal Medicine

## 2017-04-26 ENCOUNTER — Ambulatory Visit (INDEPENDENT_AMBULATORY_CARE_PROVIDER_SITE_OTHER): Payer: Medicare Other

## 2017-04-26 ENCOUNTER — Encounter: Payer: Self-pay | Admitting: Internal Medicine

## 2017-04-26 VITALS — BP 118/70 | HR 81 | Temp 98.1°F | Resp 15 | Ht 63.0 in | Wt 176.2 lb

## 2017-04-26 DIAGNOSIS — M79645 Pain in left finger(s): Secondary | ICD-10-CM

## 2017-04-26 DIAGNOSIS — I10 Essential (primary) hypertension: Secondary | ICD-10-CM | POA: Diagnosis not present

## 2017-04-26 DIAGNOSIS — E785 Hyperlipidemia, unspecified: Secondary | ICD-10-CM

## 2017-04-26 DIAGNOSIS — S62617A Displaced fracture of proximal phalanx of left little finger, initial encounter for closed fracture: Secondary | ICD-10-CM | POA: Diagnosis not present

## 2017-04-26 DIAGNOSIS — Z23 Encounter for immunization: Secondary | ICD-10-CM

## 2017-04-26 NOTE — Patient Instructions (Addendum)
Try these salads from [employer] :  [place] with ginger soy dressing   For ice cream: Enlighten Sea Salt Caramel ice cream bars (Bj's ) low sugar!!!!  Danton Clap now makes a frozen breakfast frittata that can be microwaved in 2 minutes and is very low carb. Frittats are similar to quiches without the crust     try the Ezekiel Bread for your   To make a low carb chip :  Take the Joseph's Lavash or Pita bread,  Or the [place] Low carb whole wheat tortilla   Place on metal cookie sheet  Brush with olive oil  Sprinkle garlic powder (NOT garlic salt), grated parmesan cheese, mediterranean seasoning , or all of them?  Bake at 275 for 30 minutes   We have substitutions for your potatoes!!  Try the mashed cauliflower and riced cauliflower dishes instead of rice and mashed potatoes  Mashed turnips are also very low carb!   For desserts :  Try the Dannon Lt n Fit greek yogurt dessert flavors and top with reddi Whip .  8 carbs,  80 calories  Try Oikos Triple Zero [place] Yogurt in the salted caramel, and the coffee flavors  With Whipped Cream for dessert  breyer's low carb ice cream, available in bars (on a stick, better ) or scoopable ice cream  HERE ARE THE LOW CARB  BREAD CHOICES       cheese toast

## 2017-04-26 NOTE — Progress Notes (Signed)
Subjective:  Patient ID: Kara Harvey, female    DOB: [birth year]  Age: 76 y.o. MRN: [id number]  CC: The primary encounter diagnosis was Pain in left finger(s). Diagnoses of Need for 23-polyvalent pneumococcal polysaccharide vaccine, Essential hypertension, benign, Hyperlipidemia LDL goal <130, and Closed displaced fracture of proximal phalanx of left little finger, initial encounter were also pertinent to this visit.  HPI Kara Harvey presents for 6 month follow up on obesity and hypertension  Has been eating more carbs,  But has lost weight since her last visit unintentionally.  Husband was diagnosed recentnly with type 2 Diabetes mellitus .   Has been having pain and swelling of her left 5th finger since tripping over a rug and falling at home over a week ago.  Sturck the outstretched hand on  a desk .  Has been unable to bend the finger.    Outpatient Medications Prior to Visit  Medication Sig Dispense Refill  . atorvastatin (LIPITOR) 20 MG tablet take 1 tablet by mouth once daily 90 tablet 1  . buPROPion (WELLBUTRIN SR) 150 MG 12 hr tablet Take 1 tablet (150 mg total) by mouth 2 (two) times daily. 180 tablet 3  . Cholecalciferol (VITAMIN D3) 1000 UNITS CAPS Take 1,000 Units by mouth daily.    . diphenhydrAMINE (BENADRYL) 2 % cream Apply 1 application topically 3 (three) times daily as needed for itching. Reported on 01/23/2016    . lisinopril-hydrochlorothiazide (PRINZIDE,ZESTORETIC) 10-12.5 MG tablet take 1 tablet by mouth once daily 90 tablet 1  . valACYclovir (VALTREX) 1000 MG tablet take 2 tablets by mouth twice a day 20 tablet 0  . doxycycline (VIBRA-TABS) 100 MG tablet Take 1 tablet (100 mg total) by mouth 2 (two) times daily. (Patient not taking: Reported on 12/28/2016) 20 tablet 0   No facility-administered medications prior to visit.     Review of Systems;  Patient denies headache, fevers, malaise, unintentional weight loss, skin rash, eye pain, sinus congestion and  sinus pain, sore throat, dysphagia,  hemoptysis , cough, dyspnea, wheezing, chest pain, palpitations, orthopnea, edema, abdominal pain, nausea, melena, diarrhea, constipation, flank pain, dysuria, hematuria, urinary  Frequency, nocturia, numbness, tingling, seizures,  Focal weakness, Loss of consciousness,  Tremor, insomnia, depression, anxiety, and suicidal ideation.      Objective:  BP 118/70 (BP Location: Left Arm, Patient Position: Sitting, Cuff Size: Normal)   Pulse 81   Temp 98.1 F (36.7 C) (Oral)   Resp 15   Ht 5\' 3"  (1.6 m)   Wt 176 lb 3.2 oz (79.9 kg)   SpO2 96%   BMI 31.21 kg/m   BP Readings from Last 3 Encounters:  04/26/17 118/70  12/28/16 129/71  12/11/16 126/74    Wt Readings from Last 3 Encounters:  04/26/17 176 lb 3.2 oz (79.9 kg)  12/28/16 183 lb (83 kg)  12/11/16 182 lb 2 oz (82.6 kg)    General appearance: alert, cooperative and appears stated age Ears: normal TM's and external ear canals both ears Throat: lips, mucosa, and tongue normal; teeth and gums normal Neck: no adenopathy, no carotid bruit, supple, symmetrical, trachea midline and thyroid not enlarged, symmetric, no tenderness/mass/nodules Back: symmetric, no curvature. ROM normal. No CVA tenderness. Lungs: clear to auscultation bilaterally Heart: regular rate and rhythm, S1, S2 normal, no murmur, click, rub or gallop Abdomen: soft, non-tender; bowel sounds normal; no masses,  no organomegaly Pulses: 2+ and symmetric Skin: Skin color, texture, turgor normal. No rashes or lesions Lymph nodes: Cervical, supraclavicular,  and axillary nodes normal.  No results found for: HGBA1C  Lab Results  Component Value Date   CREATININE 0.78 04/24/2017   CREATININE 0.80 10/24/2016   CREATININE 0.77 04/25/2016    Lab Results  Component Value Date   WBC 7.5 04/24/2017   HGB 13.2 04/24/2017   HCT 39.6 04/24/2017   PLT 339.0 04/24/2017   GLUCOSE 108 (H) 04/24/2017   CHOL 181 04/24/2017   TRIG 90.0  04/24/2017   HDL 79.10 04/24/2017   LDLDIRECT 135.1 09/03/2013   LDLCALC 84 04/24/2017   ALT 15 04/24/2017   AST 15 04/24/2017   NA 135 04/24/2017   K 3.9 04/24/2017   CL 99 04/24/2017   CREATININE 0.78 04/24/2017   BUN 13 04/24/2017   CO2 29 04/24/2017   TSH 5.02 (H) 04/24/2017   MICROALBUR 0.1 09/03/2013    Mm Screening Breast Tomo Bilateral  Result Date: 01/01/2017 CLINICAL DATA:  Screening. EXAM: 2D DIGITAL SCREENING BILATERAL MAMMOGRAM WITH CAD AND ADJUNCT TOMO COMPARISON:  Previous exam(s). ACR Breast Density Category c: The breast tissue is heterogeneously dense, which may obscure small masses. FINDINGS: There are no findings suspicious for malignancy. Images were processed with CAD. IMPRESSION: No mammographic evidence of malignancy. A result letter of this screening mammogram will be mailed directly to the patient. RECOMMENDATION: Screening mammogram in one year. (Code:SM-B-01Y) BI-RADS CATEGORY  1: Negative. Electronically Signed   By: Marin Olp M.D.   On: 01/01/2017 11:54    Assessment & Plan:   Problem List Items Addressed This Visit    Essential hypertension, benign    Well controlled on current regimen. Renal function stable, no changes today.  Lab Results  Component Value Date   CREATININE 0.78 04/24/2017   Lab Results  Component Value Date   NA 135 04/24/2017   K 3.9 04/24/2017   CL 99 04/24/2017   CO2 29 04/24/2017         Fracture of finger, left, closed    Secondary to fall.  Referral  To Orthopedics       Hyperlipidemia LDL goal <130    Well controlled, LDL excellent on current therapy.  Liver and kidney function are normal.  No changes today.  Repeat CMET and lipids in 6 months   Lab Results  Component Value Date   CHOL 181 04/24/2017   HDL 79.10 04/24/2017   LDLCALC 84 04/24/2017   LDLDIRECT 135.1 09/03/2013   TRIG 90.0 04/24/2017   CHOLHDL 2 04/24/2017   Lab Results  Component Value Date   ALT 15 04/24/2017   AST 15 04/24/2017    ALKPHOS 67 04/24/2017   BILITOT 0.5 04/24/2017          Other Visit Diagnoses    Pain in left finger(s)    -  Primary   Relevant Orders   DG Hand Complete Left (Completed)   Need for 23-polyvalent pneumococcal polysaccharide vaccine       Relevant Orders   Pneumococcal polysaccharide vaccine 23-valent greater than or equal to 2yo subcutaneous/IM (Completed)     A total of 25 minutes of face to face time was spent with patient more than half of which was spent in counselling about the above mentioned conditions  and coordination of care   I have discontinued Ms. Shrieves's doxycycline. I am also having her maintain her Vitamin D3, diphenhydrAMINE, buPROPion, atorvastatin, lisinopril-hydrochlorothiazide, and valACYclovir.  No orders of the defined types were placed in this encounter.   Medications Discontinued During This Encounter  Medication Reason  . doxycycline (VIBRA-TABS) 100 MG tablet Patient has not taken in last 30 days    Follow-up: Return in about 6 months (around 10/25/2017) for CPE, fasting lipids prior to appt .   Crecencio Mc, MD

## 2017-04-28 ENCOUNTER — Other Ambulatory Visit: Payer: Self-pay | Admitting: Internal Medicine

## 2017-04-28 DIAGNOSIS — S62609A Fracture of unspecified phalanx of unspecified finger, initial encounter for closed fracture: Secondary | ICD-10-CM

## 2017-04-28 DIAGNOSIS — S62617A Displaced fracture of proximal phalanx of left little finger, initial encounter for closed fracture: Secondary | ICD-10-CM

## 2017-04-28 HISTORY — DX: Fracture of unspecified phalanx of unspecified finger, initial encounter for closed fracture: S62.609A

## 2017-04-28 NOTE — Assessment & Plan Note (Signed)
I have reviewed her reduction of   BMI and encouraged  Continued weight loss with goal of 10% of body weigh over the next 6 months using a low glycemic index diet and regular exercise a minimum of 5 days per week.

## 2017-04-28 NOTE — Assessment & Plan Note (Signed)
Well controlled on current regimen. Renal function stable, no changes today.  Lab Results  Component Value Date   CREATININE 0.78 04/24/2017   Lab Results  Component Value Date   NA 135 04/24/2017   K 3.9 04/24/2017   CL 99 04/24/2017   CO2 29 04/24/2017

## 2017-04-28 NOTE — Assessment & Plan Note (Signed)
Secondary to fall.  Referral  To Orthopedics

## 2017-04-28 NOTE — Assessment & Plan Note (Signed)
Well controlled, LDL excellent on current therapy.  Liver and kidney function are normal.  No changes today.  Repeat CMET and lipids in 6 months   Lab Results  Component Value Date   CHOL 181 04/24/2017   HDL 79.10 04/24/2017   LDLCALC 84 04/24/2017   LDLDIRECT 135.1 09/03/2013   TRIG 90.0 04/24/2017   CHOLHDL 2 04/24/2017   Lab Results  Component Value Date   ALT 15 04/24/2017   AST 15 04/24/2017   ALKPHOS 67 04/24/2017   BILITOT 0.5 04/24/2017

## 2017-04-30 DIAGNOSIS — S62617A Displaced fracture of proximal phalanx of left little finger, initial encounter for closed fracture: Secondary | ICD-10-CM | POA: Diagnosis not present

## 2017-04-30 DIAGNOSIS — S62617D Displaced fracture of proximal phalanx of left little finger, subsequent encounter for fracture with routine healing: Secondary | ICD-10-CM | POA: Diagnosis not present

## 2017-06-07 ENCOUNTER — Other Ambulatory Visit: Payer: Self-pay | Admitting: Internal Medicine

## 2017-06-28 ENCOUNTER — Other Ambulatory Visit: Payer: Self-pay | Admitting: Internal Medicine

## 2017-07-13 ENCOUNTER — Emergency Department: Payer: Medicare Other

## 2017-07-13 ENCOUNTER — Observation Stay
Admission: EM | Admit: 2017-07-13 | Discharge: 2017-07-15 | Disposition: A | Discharge status: Skilled Nursing Facility | Payer: Medicare Other | Attending: Internal Medicine | Admitting: Internal Medicine

## 2017-07-13 DIAGNOSIS — E559 Vitamin D deficiency, unspecified: Secondary | ICD-10-CM | POA: Diagnosis not present

## 2017-07-13 DIAGNOSIS — Z882 Allergy status to sulfonamides status: Secondary | ICD-10-CM | POA: Insufficient documentation

## 2017-07-13 DIAGNOSIS — I10 Essential (primary) hypertension: Secondary | ICD-10-CM | POA: Insufficient documentation

## 2017-07-13 DIAGNOSIS — R110 Nausea: Secondary | ICD-10-CM | POA: Insufficient documentation

## 2017-07-13 DIAGNOSIS — F329 Major depressive disorder, single episode, unspecified: Secondary | ICD-10-CM | POA: Diagnosis not present

## 2017-07-13 DIAGNOSIS — Z6831 Body mass index (BMI) 31.0-31.9, adult: Secondary | ICD-10-CM | POA: Diagnosis not present

## 2017-07-13 DIAGNOSIS — R2681 Unsteadiness on feet: Secondary | ICD-10-CM | POA: Diagnosis not present

## 2017-07-13 DIAGNOSIS — M8580 Other specified disorders of bone density and structure, unspecified site: Secondary | ICD-10-CM | POA: Diagnosis not present

## 2017-07-13 DIAGNOSIS — I951 Orthostatic hypotension: Secondary | ICD-10-CM | POA: Diagnosis not present

## 2017-07-13 DIAGNOSIS — E669 Obesity, unspecified: Secondary | ICD-10-CM | POA: Insufficient documentation

## 2017-07-13 DIAGNOSIS — Y998 Other external cause status: Secondary | ICD-10-CM | POA: Diagnosis not present

## 2017-07-13 DIAGNOSIS — Z87891 Personal history of nicotine dependence: Secondary | ICD-10-CM | POA: Diagnosis not present

## 2017-07-13 DIAGNOSIS — S42291A Other displaced fracture of upper end of right humerus, initial encounter for closed fracture: Secondary | ICD-10-CM | POA: Diagnosis not present

## 2017-07-13 DIAGNOSIS — W010XXA Fall on same level from slipping, tripping and stumbling without subsequent striking against object, initial encounter: Secondary | ICD-10-CM | POA: Insufficient documentation

## 2017-07-13 DIAGNOSIS — Y9301 Activity, walking, marching and hiking: Secondary | ICD-10-CM | POA: Diagnosis not present

## 2017-07-13 DIAGNOSIS — Z79899 Other long term (current) drug therapy: Secondary | ICD-10-CM | POA: Diagnosis not present

## 2017-07-13 DIAGNOSIS — Y92232 Corridor of hospital as the place of occurrence of the external cause: Secondary | ICD-10-CM | POA: Insufficient documentation

## 2017-07-13 DIAGNOSIS — M79603 Pain in arm, unspecified: Secondary | ICD-10-CM | POA: Diagnosis not present

## 2017-07-13 DIAGNOSIS — S42294A Other nondisplaced fracture of upper end of right humerus, initial encounter for closed fracture: Secondary | ICD-10-CM | POA: Diagnosis not present

## 2017-07-13 DIAGNOSIS — E785 Hyperlipidemia, unspecified: Secondary | ICD-10-CM | POA: Insufficient documentation

## 2017-07-13 DIAGNOSIS — T148XXA Other injury of unspecified body region, initial encounter: Secondary | ICD-10-CM | POA: Diagnosis not present

## 2017-07-13 DIAGNOSIS — S42309A Unspecified fracture of shaft of humerus, unspecified arm, initial encounter for closed fracture: Secondary | ICD-10-CM

## 2017-07-13 DIAGNOSIS — R2689 Other abnormalities of gait and mobility: Secondary | ICD-10-CM | POA: Diagnosis not present

## 2017-07-13 DIAGNOSIS — R55 Syncope and collapse: Secondary | ICD-10-CM | POA: Diagnosis not present

## 2017-07-13 DIAGNOSIS — S42201A Unspecified fracture of upper end of right humerus, initial encounter for closed fracture: Secondary | ICD-10-CM

## 2017-07-13 HISTORY — DX: Unspecified fracture of shaft of humerus, unspecified arm, initial encounter for closed fracture: S42.309A

## 2017-07-13 LAB — URINALYSIS, COMPLETE (UACMP) WITH MICROSCOPIC
BILIRUBIN URINE: NEGATIVE
Bacteria, UA: NONE SEEN
GLUCOSE, UA: NEGATIVE mg/dL
HGB URINE DIPSTICK: NEGATIVE
Ketones, ur: 80 mg/dL — AB
LEUKOCYTES UA: NEGATIVE
NITRITE: NEGATIVE
PH: 6 (ref 5.0–8.0)
Protein, ur: NEGATIVE mg/dL
SPECIFIC GRAVITY, URINE: 1.009 (ref 1.005–1.030)

## 2017-07-13 LAB — COMPREHENSIVE METABOLIC PANEL
ALT: 19 U/L (ref 14–54)
AST: 25 U/L (ref 15–41)
Albumin: 3.9 g/dL (ref 3.5–5.0)
Alkaline Phosphatase: 63 U/L (ref 38–126)
Anion gap: 15 (ref 5–15)
BILIRUBIN TOTAL: 1 mg/dL (ref 0.3–1.2)
BUN: 14 mg/dL (ref 6–20)
CO2: 20 mmol/L — ABNORMAL LOW (ref 22–32)
Calcium: 8.9 mg/dL (ref 8.9–10.3)
Chloride: 93 mmol/L — ABNORMAL LOW (ref 101–111)
Creatinine, Ser: 0.7 mg/dL (ref 0.44–1.00)
Glucose, Bld: 126 mg/dL — ABNORMAL HIGH (ref 65–99)
POTASSIUM: 2.9 mmol/L — AB (ref 3.5–5.1)
Sodium: 128 mmol/L — ABNORMAL LOW (ref 135–145)
TOTAL PROTEIN: 6.5 g/dL (ref 6.5–8.1)

## 2017-07-13 LAB — CBC WITH DIFFERENTIAL/PLATELET
BASOS ABS: 0 10*3/uL (ref 0–0.1)
Basophils Relative: 0 %
EOS ABS: 0 10*3/uL (ref 0–0.7)
EOS PCT: 0 %
HCT: 35.4 % (ref 35.0–47.0)
HEMOGLOBIN: 12.1 g/dL (ref 12.0–16.0)
LYMPHS PCT: 10 %
Lymphs Abs: 1.3 10*3/uL (ref 1.0–3.6)
MCH: 30.1 pg (ref 26.0–34.0)
MCHC: 34.1 g/dL (ref 32.0–36.0)
MCV: 88.2 fL (ref 80.0–100.0)
Monocytes Absolute: 0.7 10*3/uL (ref 0.2–0.9)
Monocytes Relative: 6 %
NEUTROS PCT: 84 %
Neutro Abs: 10.1 10*3/uL — ABNORMAL HIGH (ref 1.4–6.5)
PLATELETS: 297 10*3/uL (ref 150–440)
RBC: 4.02 MIL/uL (ref 3.80–5.20)
RDW: 13.2 % (ref 11.5–14.5)
WBC: 12 10*3/uL — AB (ref 3.6–11.0)

## 2017-07-13 LAB — TROPONIN I

## 2017-07-13 MED ORDER — LISINOPRIL 10 MG PO TABS
10.0000 mg | ORAL_TABLET | Freq: Every day | ORAL | Status: DC
  Administered 2017-07-15: 10 mg via ORAL
  Filled 2017-07-13: qty 1

## 2017-07-13 MED ORDER — POTASSIUM CHLORIDE CRYS ER 20 MEQ PO TBCR
40.0000 meq | EXTENDED_RELEASE_TABLET | Freq: Once | ORAL | Status: AC
Start: 2017-07-13 — End: 2017-07-13
  Administered 2017-07-13: 40 meq via ORAL
  Filled 2017-07-13: qty 2

## 2017-07-13 MED ORDER — SODIUM CHLORIDE 0.9 % IV BOLUS (SEPSIS)
500.0000 mL | Freq: Once | INTRAVENOUS | Status: AC
  Administered 2017-07-13: 500 mL via INTRAVENOUS

## 2017-07-13 MED ORDER — PHENOL 1.4 % MT LIQD
1.0000 | OROMUCOSAL | Status: DC | PRN
  Filled 2017-07-13: qty 177

## 2017-07-13 MED ORDER — OXYCODONE HCL 5 MG PO TABS
5.0000 mg | ORAL_TABLET | ORAL | Status: DC | PRN
  Administered 2017-07-14 – 2017-07-15 (×8): 5 mg via ORAL
  Filled 2017-07-13 (×8): qty 1

## 2017-07-13 MED ORDER — BUPROPION HCL ER (SR) 150 MG PO TB12
150.0000 mg | ORAL_TABLET | Freq: Two times a day (BID) | ORAL | Status: DC
  Administered 2017-07-14 – 2017-07-15 (×2): 150 mg via ORAL
  Filled 2017-07-13 (×5): qty 1

## 2017-07-13 MED ORDER — MORPHINE SULFATE (PF) 2 MG/ML IV SOLN: 2.0000 mg | INTRAVENOUS | Status: DC | PRN

## 2017-07-13 MED ORDER — ATORVASTATIN CALCIUM 20 MG PO TABS
20.0000 mg | ORAL_TABLET | Freq: Every day | ORAL | Status: DC
  Administered 2017-07-14 – 2017-07-15 (×2): 20 mg via ORAL
  Filled 2017-07-13 (×2): qty 1

## 2017-07-13 MED ORDER — FENTANYL CITRATE (PF) 100 MCG/2ML IJ SOLN
INTRAMUSCULAR | Status: AC
  Administered 2017-07-13: 50 ug via INTRAVENOUS
  Filled 2017-07-13: qty 2

## 2017-07-13 MED ORDER — DOCUSATE SODIUM 100 MG PO CAPS: 100.0000 mg | ORAL_CAPSULE | Freq: Every day | ORAL | 2 refills | Status: DC | PRN

## 2017-07-13 MED ORDER — ONDANSETRON HCL 4 MG/2ML IJ SOLN
4.0000 mg | Freq: Once | INTRAMUSCULAR | Status: AC
  Administered 2017-07-13: 4 mg via INTRAVENOUS
  Filled 2017-07-13: qty 2

## 2017-07-13 MED ORDER — ONDANSETRON HCL 4 MG PO TABS: 4.0000 mg | ORAL_TABLET | Freq: Three times a day (TID) | ORAL | 0 refills | Status: DC | PRN

## 2017-07-13 MED ORDER — CALCIUM CARBONATE ANTACID 500 MG PO CHEW
1.0000 | CHEWABLE_TABLET | Freq: Three times a day (TID) | ORAL | Status: DC | PRN
  Administered 2017-07-14 (×2): 200 mg via ORAL
  Filled 2017-07-13 (×3): qty 1

## 2017-07-13 MED ORDER — ONDANSETRON HCL 4 MG/2ML IJ SOLN
4.0000 mg | Freq: Four times a day (QID) | INTRAMUSCULAR | Status: DC | PRN
  Administered 2017-07-14: 4 mg via INTRAVENOUS
  Filled 2017-07-13: qty 2

## 2017-07-13 MED ORDER — ACETAMINOPHEN 650 MG RE SUPP: 650.0000 mg | Freq: Four times a day (QID) | RECTAL | Status: DC | PRN

## 2017-07-13 MED ORDER — CEPASTAT 14.5 MG MT LOZG
1.0000 | LOZENGE | OROMUCOSAL | Status: DC | PRN
  Administered 2017-07-13 – 2017-07-14 (×2): 1 via BUCCAL
  Filled 2017-07-13: qty 9

## 2017-07-13 MED ORDER — ENOXAPARIN SODIUM 40 MG/0.4ML ~~LOC~~ SOLN
40.0000 mg | SUBCUTANEOUS | Status: DC
  Administered 2017-07-14: 40 mg via SUBCUTANEOUS
  Filled 2017-07-13: qty 0.4

## 2017-07-13 MED ORDER — HYDROCHLOROTHIAZIDE 12.5 MG PO CAPS: 12.5000 mg | ORAL_CAPSULE | Freq: Every day | ORAL | Status: DC

## 2017-07-13 MED ORDER — ONDANSETRON HCL 4 MG PO TABS
4.0000 mg | ORAL_TABLET | Freq: Four times a day (QID) | ORAL | Status: DC | PRN
  Administered 2017-07-14 – 2017-07-15 (×3): 4 mg via ORAL
  Filled 2017-07-13 (×3): qty 1

## 2017-07-13 MED ORDER — OXYCODONE-ACETAMINOPHEN 5-325 MG PO TABS: 1.0000 | ORAL_TABLET | Freq: Four times a day (QID) | ORAL | 0 refills | Status: DC | PRN

## 2017-07-13 MED ORDER — ACETAMINOPHEN 325 MG PO TABS
650.0000 mg | ORAL_TABLET | Freq: Four times a day (QID) | ORAL | Status: DC | PRN
  Filled 2017-07-13: qty 2

## 2017-07-13 MED ORDER — LISINOPRIL-HYDROCHLOROTHIAZIDE 10-12.5 MG PO TABS: 1.0000 | ORAL_TABLET | Freq: Every day | ORAL | Status: DC

## 2017-07-13 MED ORDER — SODIUM CHLORIDE 0.9 % IV SOLN
INTRAVENOUS | Status: AC
  Administered 2017-07-13: 75 mL/h via INTRAVENOUS

## 2017-07-13 MED ORDER — FENTANYL CITRATE (PF) 100 MCG/2ML IJ SOLN
50.0000 ug | Freq: Once | INTRAMUSCULAR | Status: AC
  Administered 2017-07-13: 50 ug via INTRAVENOUS

## 2017-07-13 MED ORDER — GUAIFENESIN-DM 100-10 MG/5ML PO SYRP
5.0000 mL | ORAL_SOLUTION | ORAL | Status: DC | PRN
  Administered 2017-07-14: 5 mL via ORAL
  Filled 2017-07-13: qty 5

## 2017-07-13 NOTE — ED Notes (Signed)
Pt was taken out to lobby after given discharge instructions and had a syncopal episode in the w/c with nurse present. Pt brought back to her room and will be admitted.

## 2017-07-13 NOTE — ED Provider Notes (Addendum)
[facility] Emergency Department Provider Note  ____________________________________________   I have reviewed the triage vital signs and the nursing notes. Where available I have reviewed prior notes and, if possible and indicated, outside hospital notes.    HISTORY  Chief Complaint Fall    HPI Kara Harvey is a 76 y.o. female who presents today after a non-syncopal fall.  She was walking in her kitchen and tripped up a step, falling on one arm, the right, no other injury did not hurt her hips or her head, is adamant that she did not pass out, did not have any head injury, the only thing that is hurting is her upper arm.  It was split by EMS.  She has pain in that area no radiation, sharp, better since she got fentanyl from EMS.  Patient is not on blood thinners.  Moving makes the pain worse.  Started immediately prior to arrival.  Past Medical History:  Diagnosis Date  . Hyperlipidemia   . Hypertension     Patient Active Problem List   Diagnosis Date Noted  . Fracture of finger, left, closed 04/28/2017  . Cellulitis 12/11/2016  . Vitamin D deficiency 04/29/2016  . Osteopenia 04/26/2016  . Long-term use of high-risk medication 01/25/2016  . Depressive disorder 01/25/2016  . Vaginal lesion 06/09/2015  . Medicare annual wellness visit, subsequent 03/06/2014  . Closed malleolar fracture, unspecified laterality, with routine healing, subsequent encounter 03/04/2014  . Cataract extraction status of left eye 03/04/2014  . Obesity 09/05/2013  . Urge incontinence 03/27/2013  . Essential hypertension, benign 03/27/2013  . Hyperlipidemia LDL goal <130 03/27/2013    Past Surgical History:  Procedure Laterality Date  . BREAST EXCISIONAL BIOPSY Left 1988   benign  . BREAST SURGERY Left 1986   for false positive/biopsy  . EYE SURGERY Left 6wks ago   cataract  . FRACTURE SURGERY Left 01/14/2011   ankle  . TONSILECTOMY, ADENOIDECTOMY, BILATERAL  MYRINGOTOMY AND TUBES Bilateral 1957   does not believe adenoids were removed  . TUBAL LIGATION  y-37    Prior to Admission medications   Medication Sig Start Date End Date Taking? Authorizing Provider  atorvastatin (LIPITOR) 20 MG tablet take 1 tablet by mouth once daily 06/28/17   Crecencio Mc, MD  buPROPion [facility] SR) 150 MG 12 hr tablet take 1 tablet by mouth twice a day 06/07/17   Crecencio Mc, MD  Cholecalciferol (VITAMIN D3) 1000 UNITS CAPS Take 1,000 Units by mouth daily.    [provider]  diphenhydrAMINE (BENADRYL) 2 % cream Apply 1 application topically 3 (three) times daily as needed for itching. Reported on 01/23/2016    [provider]  lisinopril-hydrochlorothiazide (PRINZIDE,ZESTORETIC) 10-12.5 MG tablet take 1 tablet by mouth once daily 06/28/17   Crecencio Mc, MD  valACYclovir (VALTREX) 1000 MG tablet take 2 tablets by mouth twice a day 06/07/17   Crecencio Mc, MD    Allergies Sulfa antibiotics  Family History  Problem Relation Age of Onset  . Hyperlipidemia Mother   . Cancer Mother   . Arthritis Father   . Alzheimer's disease Father   . Colon cancer Paternal Aunt     Social History Social History   Tobacco Use  . Smoking status: Former Smoker    Packs/day: 2.00    Types: Cigarettes    Last attempt to quit: 03/28/1979    Years since quitting: 38.3  . Smokeless tobacco: Never Used  Substance Use Topics  .  Alcohol use: Yes    Alcohol/week: 3.0 oz    Types: 5 Glasses of wine per week  . Drug use: No    Review of Systems Constitutional: No fever/chills Eyes: No visual changes. ENT: No sore throat. No stiff neck no neck pain Cardiovascular: Denies chest pain. Respiratory: Denies shortness of breath. Gastrointestinal:   no vomiting.  No diarrhea.  No constipation. Genitourinary: Negative for dysuria. Musculoskeletal: Negative lower extremity swelling Skin: Negative for rash. Neurological: Negative for severe  headaches, focal weakness or numbness.   ____________________________________________   PHYSICAL EXAM:  VITAL SIGNS: ED Triage Vitals [07/13/17 1728]  Enc Vitals Group     BP (!) 160/87     Pulse Rate 92     Resp 14     Temp 98.2 F (36.8 C)     Temp Source Oral     SpO2 99 %     Weight 175 lb (79.4 kg)     Height 5\' 3"  (1.6 m)     Head Circumference      Peak Flow      Pain Score 6     Pain Loc      Pain Edu?      Excl. in [place]?     Constitutional: Alert and oriented. Well appearing and in no acute distress. Eyes: Conjunctivae are normal Head: Atraumatic HEENT: No congestion/rhinnorhea. Mucous membranes are moist.  Oropharynx non-erythematous Neck:   Nontender with no meningismus, no masses, no stridor Cardiovascular: Normal rate, regular rhythm. Grossly normal heart sounds.  Good peripheral circulation. Respiratory: Normal respiratory effort.  No retractions. Lungs CTAB. Abdominal: Soft and nontender. No distention. No guarding no rebound Back:  There is no focal tenderness or step off.  there is no midline tenderness there are no lesions noted. there is no CVA tenderness Musculoskeletal: No lower extremity tenderness, tenderness to palpation but no significant deformity noted to the proximal humerus on the right, no evidence of tenderness or deformity to the wrist, or elbow hand or shoulder, no other injury noted. No joint effusions, no DVT signs strong distal pulses no edema Neurologic:  Normal speech and language. No gross focal neurologic deficits are appreciated.  Skin:  Skin is warm, dry and intact. No rash noted. Psychiatric: Mood and affect are normal. Speech and behavior are normal.  ____________________________________________   LABS (all labs ordered are listed, but only abnormal results are displayed)  Labs Reviewed - No data to display  Pertinent labs  results that were available during my care of the patient were reviewed by me and considered in my  medical decision making (see chart for details). ____________________________________________  EKG  I personally interpreted any EKGs ordered by me or triage Rate 74 bpm no acute ST elevation or depression normal axis no specific ST changes no acute ischemia ____________________________________________  RADIOLOGY  Pertinent labs & imaging results that were available during my care of the patient were reviewed by me and considered in my medical decision making (see chart for details). If possible, patient and/or family made aware of any abnormal findings.  No results found. ____________________________________________    PROCEDURES  Procedure(s) performed: None  Procedures  Critical Care performed: None  ____________________________________________   INITIAL IMPRESSION / ASSESSMENT AND PLAN / ED COURSE  Pertinent labs & imaging results that were available during my care of the patient were reviewed by me and considered in my medical decision making (see chart for details).  Here with a nonsurgical fall with likely proximal humerus  fracture we will obtain x-rays, no indication for CT of the head is patient is adamant she did not hit her head she is awake and alert and able to tell me these things I believe.  No evidence of injury to the hips or other areas including the back.  Neurovascular intact with good pulses.  Timestamp----------------------------------------- 6:50 PM on 07/13/2017 -----------------------------------------  Patient has approximal humerus fracture as suggested, discussed with orthopedic surgery Dr. Harlow Mares, no further intervention aside from splinting and close follow-up with them.  No other injury noted, tertiary exam is reassuring, patient neurovascularly intact, we will send her home with pain medication and close outpatient follow-up.  Return precautions and follow-up given and understood  ----------------------------------------- 8:04 PM on  07/13/2017 ----------------------------------------- Patient was able to stand up in the emergency room but she felt lightheaded after the pain medication and sat down, we gave her some time to relax, again no other injury noted from her fall, and it was clearly a non-syncopal fall which she clearly remembers.  Afterwards, she was able ambulate and she felt much better we put her in a wheelchair and brought her out to discharge and she became lightheaded and passed out for a few seconds.  It was witnessed there was no trauma, it was after she moved her arm.  She feels the pain made her pass out. Obviously it is somewhat calm difficult to send this patient home however as we cannot get adequate pain management on her without causing her symptoms that are unstable and she is already unstable on her feet.  I think she will need to be admitted to the hospital.  We will obtain an EKG and basic blood work as a precaution prior to admission  ----------------------------------------- 8:49 PM on 07/13/2017 -----------------------------------------  Feeling better but does not feel safe going home and I understand why, discussed with Dr. Jannifer Franklin to admit patient onto his service and agrees with management and need for admission.  Patient did syncopized upon discharge and this is certainly concerning for patient who lives by herself, Dr. Jannifer Franklin will follow up on the blood work which is pending as of her admission.    ____________________________________________   FINAL CLINICAL IMPRESSION(S) / ED DIAGNOSES  Final diagnoses:  None      This chart was dictated using voice recognition software.  Despite best efforts to proofread,  errors can occur which can change meaning.      Schuyler Amor, MD 07/13/17 1739    Schuyler Amor, MD 07/13/17 1851    Schuyler Amor, MD 07/13/17 2005    Schuyler Amor, MD 07/13/17 2031    Schuyler Amor, MD 07/13/17 2049

## 2017-07-13 NOTE — ED Notes (Signed)
Susan RN, aware of bed assigned 

## 2017-07-13 NOTE — H&P (Addendum)
[facility] NAME: Kara Harvey    MR#:  [id number]  DATE OF BIRTH:  [birth year]  DATE OF ADMISSION:  07/13/2017  PRIMARY CARE PHYSICIAN: Crecencio Mc, MD   REQUESTING/REFERRING PHYSICIAN: Burlene Arnt, MD  CHIEF COMPLAINT:   Chief Complaint  Patient presents with  . Fall    HISTORY OF PRESENT ILLNESS:  Kara Harvey  is a 76 y.o. female who presents with fall at home and subsequent humerus fracture.  Patient was discharged from the ED, but had a difficult time ambulating due to pain and then had a syncopal episode in the discharge area.  Given the same hospitalist were called for admission  PAST MEDICAL HISTORY:   Past Medical History:  Diagnosis Date  . Hyperlipidemia   . Hypertension     PAST SURGICAL HISTORY:   Past Surgical History:  Procedure Laterality Date  . BREAST EXCISIONAL BIOPSY Left 1988   benign  . BREAST SURGERY Left 1986   for false positive/biopsy  . EYE SURGERY Left 6wks ago   cataract  . FRACTURE SURGERY Left 01/14/2011   ankle  . TONSILECTOMY, ADENOIDECTOMY, BILATERAL MYRINGOTOMY AND TUBES Bilateral 1957   does not believe adenoids were removed  . TUBAL LIGATION  y-37    SOCIAL HISTORY:   Social History   Tobacco Use  . Smoking status: Former Smoker    Packs/day: 2.00    Types: Cigarettes    Last attempt to quit: 03/28/1979    Years since quitting: 38.3  . Smokeless tobacco: Never Used  Substance Use Topics  . Alcohol use: Yes    Alcohol/week: 3.0 oz    Types: 5 Glasses of wine per week    FAMILY HISTORY:   Family History  Problem Relation Age of Onset  . Hyperlipidemia Mother   . Cancer Mother   . Arthritis Father   . Alzheimer's disease Father   . Colon cancer Paternal Aunt     DRUG ALLERGIES:   Allergies  Allergen Reactions  . Sulfa Antibiotics Rash    MEDICATIONS AT HOME:   Prior to Admission medications   Medication Sig Start Date End Date Taking?  Authorizing Provider  atorvastatin (LIPITOR) 20 MG tablet take 1 tablet by mouth once daily 06/28/17  Yes Crecencio Mc, MD  buPROPion [facility] SR) 150 MG 12 hr tablet take 1 tablet by mouth twice a day 06/07/17  Yes Crecencio Mc, MD  Cholecalciferol (VITAMIN D3) 1000 UNITS CAPS Take 1,000 Units by mouth daily.   Yes [provider]  lisinopril-hydrochlorothiazide (PRINZIDE,ZESTORETIC) 10-12.5 MG tablet take 1 tablet by mouth once daily 06/28/17  Yes Crecencio Mc, MD  diphenhydrAMINE (BENADRYL) 2 % cream Apply 1 application topically 3 (three) times daily as needed for itching. Reported on 01/23/2016    [provider]  docusate sodium (COLACE) 100 MG capsule Take 1 capsule (100 mg total) by mouth daily as needed. 07/13/17 07/13/18  Schuyler Amor, MD  ondansetron (ZOFRAN) 4 MG tablet Take 1 tablet (4 mg total) by mouth every 8 (eight) hours as needed for nausea or vomiting. 07/13/17   Schuyler Amor, MD  oxyCODONE-acetaminophen (ROXICET) 5-325 MG tablet Take 1 tablet by mouth every 6 (six) hours as needed. 07/13/17 07/13/18  Schuyler Amor, MD  valACYclovir (VALTREX) 1000 MG tablet take 2 tablets by mouth twice a day Patient not taking: Reported on 07/13/2017 06/07/17   Crecencio Mc, MD    REVIEW  OF SYSTEMS:  Review of Systems  Constitutional: Negative for chills, fever, malaise/fatigue and weight loss.  HENT: Negative for ear pain, hearing loss and tinnitus.   Eyes: Negative for blurred vision, double vision, pain and redness.  Respiratory: Negative for cough, hemoptysis and shortness of breath.   Cardiovascular: Negative for chest pain, palpitations, orthopnea and leg swelling.  Gastrointestinal: Negative for abdominal pain, constipation, diarrhea, nausea and vomiting.  Genitourinary: Negative for dysuria, frequency and hematuria.  Musculoskeletal: Positive for falls. Negative for back pain, joint pain and neck pain.       Right arm pain  Skin:        No acne, rash, or lesions  Neurological: Negative for dizziness, tremors, focal weakness and weakness.  Endo/Heme/Allergies: Negative for polydipsia. Does not bruise/bleed easily.  Psychiatric/Behavioral: Negative for depression. The patient is not nervous/anxious and does not have insomnia.      VITAL SIGNS:   Vitals:   07/13/17 1800 07/13/17 1830 07/13/17 1900 07/13/17 2100  BP: (!) 143/64 133/68 139/63 (!) 130/59  Pulse:      Resp:    17  Temp:      TempSrc:      SpO2:      Weight:      Height:       Wt Readings from Last 3 Encounters:  07/13/17 79.4 kg (175 lb)  04/26/17 79.9 kg (176 lb 3.2 oz)  12/28/16 83 kg (183 lb)    PHYSICAL EXAMINATION:  Physical Exam  Vitals reviewed. Constitutional: She is oriented to person, place, and time. She appears well-developed and well-nourished. No distress.  HENT:  Head: Normocephalic and atraumatic.  Mouth/Throat: Oropharynx is clear and moist.  Eyes: Conjunctivae and EOM are normal. Pupils are equal, round, and reactive to light. No scleral icterus.  Neck: Normal range of motion. Neck supple. No JVD present. No thyromegaly present.  Cardiovascular: Normal rate, regular rhythm and intact distal pulses. Exam reveals no gallop and no friction rub.  No murmur heard. Respiratory: Effort normal and breath sounds normal. No respiratory distress. She has no wheezes. She has no rales.  GI: Soft. Bowel sounds are normal. She exhibits no distension. There is no tenderness.  Musculoskeletal: Normal range of motion. She exhibits tenderness (Right arm). She exhibits no edema.  No arthritis, no gout  Lymphadenopathy:    She has no cervical adenopathy.  Neurological: She is alert and oriented to person, place, and time. No cranial nerve deficit.  No dysarthria, no aphasia  Skin: Skin is warm and dry. No rash noted. No erythema.  Psychiatric: She has a normal mood and affect. Her behavior is normal. Judgment and thought content normal.     LABORATORY PANEL:   CBC Recent Labs  Lab 07/13/17 2025  WBC 12.0*  HGB 12.1  HCT 35.4  PLT 297   ------------------------------------------------------------------------------------------------------------------  Chemistries  Recent Labs  Lab 07/13/17 2025  NA 128*  K 2.9*  CL 93*  CO2 20*  GLUCOSE 126*  BUN 14  CREATININE 0.70  CALCIUM 8.9  AST 25  ALT 19  ALKPHOS 63  BILITOT 1.0   ------------------------------------------------------------------------------------------------------------------  Cardiac Enzymes Recent Labs  Lab 07/13/17 2025  TROPONINI <0.03   ------------------------------------------------------------------------------------------------------------------  RADIOLOGY:  Dg Humerus Right  Result Date: 07/13/2017 CLINICAL DATA:  Recent fall with right arm pain, initial encounter EXAM: RIGHT HUMERUS - 2+ VIEW COMPARISON:  None. FINDINGS: There is a comminuted fracture of the proximal right humerus involving the surgical neck and extending superiorly into the  humeral head. No definitive dislocation is seen. The remainder the humerus is within normal limits. IMPRESSION: Comminuted proximal right humeral fracture. Electronically Signed   By: Inez Catalina M.D.   On: 07/13/2017 18:29    EKG:   Orders placed or performed during the hospital encounter of 07/13/17  . ED EKG  . ED EKG    IMPRESSION AND PLAN:  Principal Problem:   Humerus fracture -PRN analgesia, PT OT consults, orthopedic consult Active Problems:   Essential hypertension, benign -continue home meds   Hyperlipidemia LDL goal <130 -continue home meds   Syncope -vagal episode due to pain.  EKG showed no acute ischemic pathology and troponin was within normal limits  All the records are reviewed and case discussed with ED provider. Management plans discussed with the patient and/or family.  DVT PROPHYLAXIS: SubQ lovenox  GI PROPHYLAXIS: None  ADMISSION STATUS:  Observation  CODE STATUS: Full Code Status History    This patient does not have a recorded code status. Please follow your organizational policy for patients in this situation.      TOTAL TIME TAKING CARE OF THIS PATIENT: 40 minutes.   Cristiano Capri [place] 07/13/2017, 10:08 PM  [employer]  [phone]  CC: Primary care physician; Crecencio Mc, MD  Note:  This document was prepared using Dragon voice recognition software and may include unintentional dictation errors.

## 2017-07-13 NOTE — ED Triage Notes (Signed)
Pt presents via EMS s/p fall at home.Pt reports tripping. C/o RUE pain. Splint applied prior to arrival. Pt A&Ox4. Denies LOC.

## 2017-07-13 NOTE — ED Notes (Signed)
Had pt stand for shoulder immobilizer to be placed. Pt became nauseated and needed to lay back down. Pt drinking water. Dr aware

## 2017-07-14 ENCOUNTER — Other Ambulatory Visit: Payer: Self-pay

## 2017-07-14 DIAGNOSIS — E785 Hyperlipidemia, unspecified: Secondary | ICD-10-CM | POA: Diagnosis not present

## 2017-07-14 DIAGNOSIS — R55 Syncope and collapse: Secondary | ICD-10-CM | POA: Diagnosis not present

## 2017-07-14 DIAGNOSIS — S42309A Unspecified fracture of shaft of humerus, unspecified arm, initial encounter for closed fracture: Secondary | ICD-10-CM | POA: Diagnosis not present

## 2017-07-14 DIAGNOSIS — S42201A Unspecified fracture of upper end of right humerus, initial encounter for closed fracture: Secondary | ICD-10-CM | POA: Diagnosis not present

## 2017-07-14 DIAGNOSIS — I10 Essential (primary) hypertension: Secondary | ICD-10-CM | POA: Diagnosis not present

## 2017-07-14 LAB — BASIC METABOLIC PANEL
Anion gap: 12 (ref 5–15)
BUN: 11 mg/dL (ref 6–20)
CALCIUM: 8.5 mg/dL — AB (ref 8.9–10.3)
CHLORIDE: 98 mmol/L — AB (ref 101–111)
CO2: 20 mmol/L — ABNORMAL LOW (ref 22–32)
CREATININE: 0.71 mg/dL (ref 0.44–1.00)
GFR calc Af Amer: >60 mL/min (ref >60)
GFR calc non Af Amer: >60 mL/min (ref >60)
Glucose, Bld: 105 mg/dL — ABNORMAL HIGH (ref 65–99)
Potassium: 3.7 mmol/L (ref 3.5–5.1)
SODIUM: 130 mmol/L — AB (ref 135–145)

## 2017-07-14 LAB — CBC
HCT: 33.3 % — ABNORMAL LOW (ref 35.0–47.0)
Hemoglobin: 11.4 g/dL — ABNORMAL LOW (ref 12.0–16.0)
MCH: 30.4 pg (ref 26.0–34.0)
MCHC: 34.3 g/dL (ref 32.0–36.0)
MCV: 88.8 fL (ref 80.0–100.0)
PLATELETS: 270 10*3/uL (ref 150–440)
RBC: 3.75 MIL/uL — ABNORMAL LOW (ref 3.80–5.20)
RDW: 13.2 % (ref 11.5–14.5)
WBC: 12.7 10*3/uL — AB (ref 3.6–11.0)

## 2017-07-14 MED ORDER — FAMOTIDINE 20 MG PO TABS
20.0000 mg | ORAL_TABLET | Freq: Two times a day (BID) | ORAL | Status: DC
  Administered 2017-07-14 – 2017-07-15 (×2): 20 mg via ORAL
  Filled 2017-07-14 (×2): qty 1

## 2017-07-14 MED ORDER — CALCIUM CARBONATE ANTACID 500 MG PO CHEW: 1.0000 | CHEWABLE_TABLET | Freq: Three times a day (TID) | ORAL | 0 refills | Status: AC | PRN

## 2017-07-14 NOTE — Evaluation (Signed)
Physical Therapy Evaluation Patient Details Name: Kara Harvey MRN: [id number] DOB: 07/14/1941 Today's Date: 07/14/2017   History of Present Illness  Pt is a 76 y/o F who presented s/p fall at home with subsequent R humeral fx.  Ortho was consulted who decided non surgical management with pt in sling, NWB, no PROM/AROM RUE.  Pt's PMH includes L ankle fx surgery.     Clinical Impression  Pt admitted with above diagnosis. Pt currently with functional limitations due to the deficits listed below (see PT Problem List). Kara Harvey was Independent with all aspects of mobility, ADLs, IADLs, ambulation PTA.  She currently requires min guard assist for safety with transfers and ambulation due to constant feeling of mild dizziness with positional changes.  Pt with +orthostatics and extensive education provided on safety with mobility in her home.  Pt will have physical assist available from her son at d/c. Pt will benefit from skilled PT to increase their independence and safety with mobility to allow discharge to the venue listed below.      Follow Up Recommendations Home health PT    Equipment Recommendations  None recommended by PT(pt has SPC at home)    Recommendations for Other Services       Precautions / Restrictions Precautions Precautions: Fall;Other (comment) Precaution Comments: +orthostatic Required Braces or Orthoses: Sling(around waste sling) Restrictions Weight Bearing Restrictions: Yes RUE Weight Bearing: Non weight bearing Other Position/Activity Restrictions: Per verbal from Dr. Vianne Bulls the pt is NWB RUE.  No PROM or AROM R shoulder.       Mobility  Bed Mobility Overal bed mobility: Needs Assistance Bed Mobility: Supine to Sit     Supine to sit: Min guard;HOB elevated     General bed mobility comments: Heavy use of bed rail.  Increased time and effort.   Transfers Overall transfer level: Needs assistance Equipment used: None;Straight cane Transfers: Sit  to/from Stand Sit to Stand: Min guard         General transfer comment: Pt stood from bed without AD with no unsteadiness appreciated.  SPC introduced to ambulate and cues provided to reach back for armrest when preparing to sit.   Ambulation/Gait Ambulation/Gait assistance: Min guard Ambulation Distance (Feet): 40 Feet Assistive device: Straight cane Gait Pattern/deviations: Step-through pattern;Decreased stride length Gait velocity: decreased Gait velocity interpretation: Below normal speed for age/gender General Gait Details: Dec Bil step length.  Pt reports nausea and dizziness that does not worsen but limited ambulatory distance.  See general comments below for orthostatic vital signs.  Pt utilizes SPC and ambualtes with a slow but steady gait.  No sign of instability noted.   Stairs            Wheelchair Mobility    Modified Rankin (Stroke Patients Only)       Balance Overall balance assessment: Needs assistance Sitting-balance support: No upper extremity supported;Feet supported Sitting balance-Leahy Scale: Good     Standing balance support: No upper extremity supported;During functional activity Standing balance-Leahy Scale: Fair Standing balance comment: Pt able to stand statically without UE support.  Benefits from use of SPC when ambulating.                              Pertinent Vitals/Pain Pain Assessment: Faces Faces Pain Scale: Hurts even more Pain Location: R shoulder Pain Descriptors / Indicators: Aching;Guarding;Grimacing Pain Intervention(s): Limited activity within patient's tolerance;Monitored during session;Repositioned;Premedicated before session    Home Living  Family/patient expects to be discharged to:: Private residence Living Arrangements: Children(son) Available Help at Discharge: Family;Available 24 hours/day Type of Home: House Home Access: Stairs to enter Entrance Stairs-Rails: None Entrance Stairs-Number of Steps:  1 Home Layout: One level Home Equipment: Cane - single point Additional Comments: Pt reports that her son is mentally disabled (has schizophrenia and bipolar disorder) but he is able to assist her physically.  Husband with recent stroke and is currently at [facility].     Prior Function Level of Independence: Independent         Comments: Pt was independent with all aspects of mobility PTA.  Independent with ADLs, IADLs.  No additional falls in the past 6 months.  Retired.   Does not ambulate with AD at baseline.      Hand Dominance        Extremity/Trunk Assessment   Upper Extremity Assessment Upper Extremity Assessment: RUE deficits/detail RUE Deficits / Details: RUE in sling     Lower Extremity Assessment Lower Extremity Assessment: Overall WFL for tasks assessed       Communication   Communication: No difficulties  Cognition Arousal/Alertness: Awake/alert Behavior During Therapy: WFL for tasks assessed/performed Overall Cognitive Status: Within Functional Limits for tasks assessed                                        General Comments General comments (skin integrity, edema, etc.): BP supine at start of session 126/57, sitting EOB 122/59, standing at bedside 109/58, sitting after ambulating 96/57.  RN notified of pt's BP readings.  Pt reports mild dizziness that remains constant throughout session that began with supine>sit.  Some improvement in symptoms after sitting.     Exercises Other Exercises Other Exercises: Instructed pt to have assist from son to enter/exit home and to enter/exit bed.   Other Exercises: Instructed pt to never ambulate if she is feeling dizzy and instructed her to have her son place chairs around their home so she can sit if needed due to dizziness.  Other Exercises: Instructed pt in transitioning from supine>stand slowly with dizziness resolving in each position before proceeding.  Other Exercises: Instructed pt in using SPC at all  times when OOB at d/c.    Assessment/Plan    PT Assessment Patient needs continued PT services  PT Problem List Decreased strength;Decreased range of motion;Decreased activity tolerance;Decreased balance;Decreased knowledge of use of DME;Decreased safety awareness;Pain       PT Treatment Interventions DME instruction;Gait training;Stair training;Functional mobility training;Therapeutic activities;Therapeutic exercise;Balance training;Patient/family education;Neuromuscular re-education;Modalities    PT Goals (Current goals can be found in the Care Plan section)  Acute Rehab PT Goals Patient Stated Goal: to feel better and return home PT Goal Formulation: With patient Time For Goal Achievement: 08/28/17 Potential to Achieve Goals: Good    Frequency 7X/week   Barriers to discharge        Co-evaluation               AM-PAC PT "6 Clicks" Daily Activity  Outcome Measure Difficulty turning over in bed (including adjusting bedclothes, sheets and blankets)?: Unable Difficulty moving from lying on back to sitting on the side of the bed? : Unable Difficulty sitting down on and standing up from a chair with arms (e.g., wheelchair, bedside commode, etc,.)?: A Little Help needed moving to and from a bed to chair (including a wheelchair)?: A Little Help needed walking  in hospital room?: A Little Help needed climbing 3-5 steps with a railing? : A Little 6 Click Score: 14    End of Session Equipment Utilized During Treatment: Gait belt;Other (comment)(RUE around wait sling) Activity Tolerance: Treatment limited secondary to medical complications (Comment);Patient tolerated treatment well(orthostatic) Patient left: in chair;with call bell/phone within reach;with chair alarm set;with family/visitor present Nurse Communication: Mobility status;Other (comment)(+orthostatic, pt's nausea) PT Visit Diagnosis: Unsteadiness on feet (R26.81);Pain;Other abnormalities of gait and mobility  (R26.89) Pain - Right/Left: Right Pain - part of body: Shoulder    Time: 8315-1761 PT Time Calculation (min) (ACUTE ONLY): 33 min   Charges:   PT Evaluation $PT Eval Low Complexity: 1 Low PT Treatments $Gait Training: 8-22 mins   PT G Codes:   PT G-Codes **NOT FOR INPATIENT CLASS** Functional Assessment Tool Used: AM-PAC 6 Clicks Basic Mobility;Clinical judgement Functional Limitation: Mobility: Walking and moving around Mobility: Walking and Moving Around Current Status (Y0737): At least 40 percent but less than 60 percent impaired, limited or restricted Mobility: Walking and Moving Around Goal Status [phone]): At least 20 percent but less than 40 percent impaired, limited or restricted    Collie Siad PT, DPT 07/14/2017, 10:26 AM

## 2017-07-14 NOTE — Discharge Summary (Signed)
Kara Harvey, is a 76 y.o. female  DOB [birth year]  MRN [id number].  Admission date:  07/13/2017  Admitting Physician  Lance Coon, MD  Discharge Date:  07/14/2017   Primary MD  Crecencio Mc, MD  Recommendations for primary care physician for things to follow:  Follow-up with PCP in 1 week Follow-up with t orthopedic Dr. Buel Ream 2 weeks.   Admission Diagnosis  Closed fracture of proximal end of right humerus, unspecified fracture morphology, initial encounter [S42.201A]   Discharge Diagnosis  Closed fracture of proximal end of right humerus, unspecified fracture morphology, initial encounter [S42.201A]    Principal Problem:   Humerus fracture Active Problems:   Essential hypertension, benign   Hyperlipidemia LDL goal <130      Past Medical History:  Diagnosis Date  . Hyperlipidemia   . Hypertension     Past Surgical History:  Procedure Laterality Date  . BREAST EXCISIONAL BIOPSY Left 1988   benign  . BREAST SURGERY Left 1986   for false positive/biopsy  . EYE SURGERY Left 6wks ago   cataract  . FRACTURE SURGERY Left 01/14/2011   ankle  . TONSILECTOMY, ADENOIDECTOMY, BILATERAL MYRINGOTOMY AND TUBES Bilateral 1957   does not believe adenoids were removed  . TUBAL LIGATION  y-37       History of present illness and  Hospital Course:     Kindly see H&P for history of present illness and admission details, please review complete Labs, Consult reports and Test reports for all details in brief  HPI  from the history and physical done on the day of admission 76 year old female patient admitted because of syncope.  Patient to ER yesterday after a fall and found to have right humerus fracture, discharged from the emergency room by discharge lobby patient had a syncopal event so admitted to medical  service.   Hospital Course  Vasovagal syncope: Admitted to medical service started on IV fluids, patient feels much better today.  Patient had nausea likely due to narcotics.  Comminuted fracture of right shoulder.  Seen by orthopedic, continue conservative management with sling, follow-up with x-rays of the right shoulder 2 weeks\,follow up with [facility], physical therapy to see the patient and discharged home with home physical therapy, home health aide.  Continue pain medicines as tolerated with Percocet.   Essential hypertension: Controlled, continue home medicines.     Discharge Condition: Stable   Follow UP  Follow-up Information    Crecencio Mc, MD. Schedule an appointment as soon as possible for a visit in 2 days.   Specialty:  Internal Medicine Contact information: [place] [address] [phone]        Lovell Sheehan, MD. Schedule an appointment as soon as possible for a visit in 2 days.   Specialty:  Orthopedic Surgery Contact information: [place] [address] [phone]             Discharge Instructions  and  Discharge Medications      Allergies as of 07/14/2017      Reactions   Sulfa Antibiotics Rash      Medication List    TAKE these medications   atorvastatin 20 MG tablet Commonly known as:  LIPITOR take 1 tablet by mouth once daily   buPROPion 150 MG 12 hr tablet Commonly known as:  WELLBUTRIN SR take 1 tablet by mouth twice a day   calcium carbonate 500 MG chewable tablet Commonly known as:  TUMS - dosed in mg elemental calcium Chew 1 tablet (200 mg of elemental calcium total) by mouth 3 (three) times daily as needed for indigestion or heartburn.   diphenhydrAMINE 2 % cream Commonly known as:  BENADRYL Apply 1 application topically 3 (three) times daily as needed for itching. Reported on 01/23/2016   docusate sodium 100 MG capsule Commonly known as:  COLACE Take 1  capsule (100 mg total) by mouth daily as needed.   lisinopril-hydrochlorothiazide 10-12.5 MG tablet Commonly known as:  PRINZIDE,ZESTORETIC take 1 tablet by mouth once daily   ondansetron 4 MG tablet Commonly known as:  ZOFRAN Take 1 tablet (4 mg total) by mouth every 8 (eight) hours as needed for nausea or vomiting.   oxyCODONE-acetaminophen 5-325 MG tablet Commonly known as:  ROXICET Take 1 tablet by mouth every 6 (six) hours as needed.   valACYclovir 1000 MG tablet Commonly known as:  VALTREX take 2 tablets by mouth twice a day   Vitamin D3 1000 units Caps Take 1,000 Units by mouth daily.         Diet and Activity recommendation: See Discharge Instructions above   Consults obtained -. Orthopedic  Major procedures and Radiology Reports - PLEASE review detailed and final reports for all details, in brief -      Dg Humerus Right  Result Date: 07/13/2017 CLINICAL DATA:  Recent fall with right arm pain, initial encounter EXAM: RIGHT HUMERUS - 2+ VIEW COMPARISON:  None. FINDINGS: There is a comminuted fracture of the proximal right humerus involving the surgical neck and extending superiorly into the humeral head. No definitive dislocation is seen. The remainder the humerus is within normal limits. IMPRESSION: Comminuted proximal right humeral fracture. Electronically Signed   By: Inez Catalina M.D.   On: 07/13/2017 18:29    Micro Results     No results found for this or any previous visit (from the past 240 hour(s)).     Today   Subjective:   Kara Harvey today has no headache,no chest abdominal pain,no new weakness tingling or numbness, feels much better wants to go home today.   Objective:   Blood pressure (!) 113/47, pulse 89, temperature 98.2 F (36.8 C), temperature source Oral, resp. rate 16, height 5\' 3"  (1.6 m), weight 80.5 kg (177 lb 8 oz), SpO2 94 %.   Intake/Output Summary (Last 24 hours) at 07/14/2017 0905 Last data filed at 07/14/2017  0534 Gross per 24 hour  Intake 547.5 ml  Output 350 ml  Net 197.5 ml    Exam Awake Alert, Oriented x 3, No new F.N deficits, Normal affect [place].AT,PERRAL Supple Neck,No JVD, No cervical lymphadenopathy appriciated.  Symmetrical Chest wall movement, Good air movement bilaterally, CTAB RRR,No Gallops,Rubs or new Murmurs, No Parasternal Heave +ve B.Sounds, Abd Soft, Non tender, No organomegaly appriciated, No rebound -guarding or rigidity. No Cyanosis, Clubbing or edema, No new Rash or bruise  Data Review   CBC w Diff:  Lab Results  Component Value Date   WBC 12.7 (H) 07/14/2017   HGB 11.4 (L) 07/14/2017   HCT 33.3 (L) 07/14/2017   PLT 270 07/14/2017   LYMPHOPCT 10 07/13/2017   MONOPCT 6 07/13/2017   EOSPCT 0 07/13/2017   BASOPCT 0 07/13/2017    CMP:  Lab Results  Component Value Date   NA 130 (L) 07/14/2017   K 3.7 07/14/2017   K 3.6 12/18/2013   CL 98 (L) 07/14/2017   CO2 20 (L) 07/14/2017   BUN 11 07/14/2017  CREATININE 0.71 07/14/2017   PROT 6.5 07/13/2017   ALBUMIN 3.9 07/13/2017   BILITOT 1.0 07/13/2017   ALKPHOS 63 07/13/2017   AST 25 07/13/2017   ALT 19 07/13/2017  .   Total Time in preparing paper work, data evaluation and todays exam - 35 minutes  Epifanio Lesches M.D on 07/14/2017 at 9:05 AM    Note: This dictation was prepared with Dragon dictation along with smaller phrase technology. Any transcriptional errors that result from this process are unintentional.

## 2017-07-14 NOTE — Progress Notes (Signed)
Still has dizziness, orthostatic hypotension when she worked with physical therapy.   cancel the discharge today, continue hydration, continue to monitor, avoid diuretics.  Likely discharge tomorrow morning.  Discussed with nurse.

## 2017-07-14 NOTE — Evaluation (Signed)
Occupational Therapy Evaluation Patient Details Name: Kara Harvey MRN: [id number] DOB: 02-01-1941 Today's Date: 07/14/2017    History of Present Illness Pt is a 76 y/o F who presented s/p fall at home with subsequent R humeral fx.  Ortho was consulted who decided non surgical management with pt in sling, NWB, no PROM/AROM RUE.  Pt's PMH includes L ankle fx surgery.    Clinical Impression   Patient seen for OT evaluation this date as follows:  Patient lives at home in a home story house with one step to enter, her son lives with her, she reports he is mentally ill but can help at times with physical tasks. Her husband suffered a stroke just over a week ago and is currently in a rehab center.  She reports she fell after talking with a neighbor and coming in the back steps to the kitchen.  She is right hand dominant which is the UE she injured. She is now in a sling, NWB to RUE and no PROM/AROM currently.  She presents with muscle weakness, decreased ability to perform self care and IADL tasks, and some issues with orthostatic blood pressure this date. She would benefit from skilled OT to maximize her safety and independence in daily tasks and would also benefit from [facility] upon discharge.     Follow Up Recommendations  Home health OT    Equipment Recommendations       Recommendations for Other Services       Precautions / Restrictions Precautions Precautions: Fall;Other (comment) Precaution Comments: +orthostatic Required Braces or Orthoses: Sling Restrictions Weight Bearing Restrictions: Yes RUE Weight Bearing: Non weight bearing Other Position/Activity Restrictions: Per verbal from Dr. Vianne Bulls the pt is NWB RUE.  No PROM or AROM R shoulder.       Mobility Bed Mobility Overal bed mobility: Needs Assistance Bed Mobility: Supine to Sit     Supine to sit: Min guard;HOB elevated     General bed mobility comments: Heavy use of bed rail.  Increased time and effort.    Transfers Overall transfer level: Needs assistance Equipment used: None;Straight cane Transfers: Sit to/from Stand Sit to Stand: Min guard         General transfer comment: Pt stood from bed without AD with no unsteadiness appreciated.  SPC introduced to ambulate and cues provided to reach back for armrest when preparing to sit.     Balance Overall balance assessment: Needs assistance Sitting-balance support: No upper extremity supported;Feet supported Sitting balance-Leahy Scale: Good     Standing balance support: No upper extremity supported;During functional activity Standing balance-Leahy Scale: Fair Standing balance comment: Pt able to stand statically without UE support.  Benefits from use of SPC when ambulating.                            ADL either performed or assessed with clinical judgement   ADL Overall ADL's : Needs assistance/impaired Eating/Feeding: Minimal assistance Eating/Feeding Details (indicate cue type and reason): patient can feed self with left hand but will require assist for plate setup and cutting Grooming: Minimal assistance Grooming Details (indicate cue type and reason): all tasks one handed with non affected left hand      Lower Body Bathing: Moderate assistance   Upper Body Dressing : Minimal assistance   Lower Body Dressing: Moderate assistance Lower Body Dressing Details (indicate cue type and reason): unable to don socks Toilet Transfer: Min guard   Toileting- Clothing Manipulation  and Hygiene: Minimal assistance       Functional mobility during ADLs: Min guard General ADL Comments: Patient complains of nausea this date, she was verbally instructed on sling management and self care tasks, she did not want to attempt any UE dressing techniques this date.      Vision Baseline Vision/History: Wears glasses Patient Visual Report: No change from baseline       Perception     Praxis      Pertinent Vitals/Pain Pain  Assessment: 0-10 Pain Score: 2  Faces Pain Scale: Hurts even more Pain Location: R shoulder Pain Descriptors / Indicators: Aching;Guarding;Grimacing Pain Intervention(s): Limited activity within patient's tolerance;Monitored during session;Repositioned     Hand Dominance Right   Extremity/Trunk Assessment Upper Extremity Assessment Upper Extremity Assessment: RUE deficits/detail RUE Deficits / Details: RUE in sling, no active or PROM per MD   Lower Extremity Assessment Lower Extremity Assessment: Defer to PT evaluation       Communication Communication Communication: No difficulties   Cognition Arousal/Alertness: Awake/alert Behavior During Therapy: WFL for tasks assessed/performed Overall Cognitive Status: Within Functional Limits for tasks assessed                                  PER PT:   General Comments  BP supine at start of session 126/57, sitting EOB 122/59, standing at bedside 109/58, sitting after ambulating 96/57.  RN notified of pt's BP readings.  Pt reports mild dizziness that remains constant throughout session that began with supine>sit.  Some improvement in symptoms after sitting.     Exercises     Shoulder Instructions      Home Living Family/patient expects to be discharged to:: Private residence Living Arrangements: Children Available Help at Discharge: Family;Available 24 hours/day Type of Home: House Home Access: Stairs to enter [employer] of Steps: 1 Entrance Stairs-Rails: None Home Layout: One level     Bathroom Shower/Tub: Occupational psychologist: Standard     Home Equipment: Cane - single point   Additional Comments: Pt reports that her son is mentally disabled (has schizophrenia and bipolar disorder) but he is able to assist her physically.  Husband with recent stroke and is currently at [facility].       Prior Functioning/Environment Level of Independence: Independent        Comments: Pt was  independent with all aspects of mobility PTA.  Independent with ADLs, IADLs.  No additional falls in the past 6 months.  Retired.   Does not ambulate with AD at baseline.         OT Problem List: Decreased strength;Decreased activity tolerance;Decreased knowledge of use of DME or AE;Impaired UE functional use;Decreased range of motion;Pain      OT Treatment/Interventions: Self-care/ADL training;DME and/or AE instruction;Therapeutic activities;Patient/family education    OT Goals(Current goals can be found in the care plan section) Acute Rehab OT Goals Patient Stated Goal: to be able to do for myself and go home OT Goal Formulation: With patient Time For Goal Achievement: 07/20/17 Potential to Achieve Goals: Good ADL Goals Pt Will Perform Upper Body Dressing: (P) with min assist  OT Frequency: Min 1X/week   Barriers to D/C:            Co-evaluation              AM-PAC PT "6 Clicks" Daily Activity     Outcome Measure Help from another person eating  meals?: A Little Help from another person taking care of personal grooming?: A Little Help from another person toileting, which includes using toliet, bedpan, or urinal?: A Little Help from another person bathing (including washing, rinsing, drying)?: A Lot Help from another person to put on and taking off regular upper body clothing?: A Lot Help from another person to put on and taking off regular lower body clothing?: A Little 6 Click Score: 16   End of Session Equipment Utilized During Treatment: Gait belt  Activity Tolerance: Patient tolerated treatment well Patient left: in chair;with call bell/phone within reach;with chair alarm set  OT Visit Diagnosis: Pain;Muscle weakness (generalized) (M62.81) Pain - Right/Left: Right Pain - part of body: Arm                Time: 4076-8088 OT Time Calculation (min): 35 min Charges:  OT General Charges $OT Visit: 1 Visit OT Evaluation $OT Eval Low Complexity: 1 Low OT  Treatments $Self Care/Home Management : 8-22 mins G-Codes: OT G-codes **NOT FOR INPATIENT CLASS** Functional Assessment Tool Used: AM-PAC 6 Clicks Daily Activity Functional Limitation: Self care Self Care Current Status (P1031): At least 40 percent but less than 60 percent impaired, limited or restricted Self Care Goal Status (R9458): At least 1 percent but less than 20 percent impaired, limited or restricted   Normand Damron T Cassadee Vanzandt, OTR/L, CLT  Kara Harvey 07/14/2017, 11:33 AM

## 2017-07-14 NOTE — Consult Note (Signed)
ORTHOPAEDIC CONSULTATION  REQUESTING PHYSICIAN: Epifanio Lesches, MD  Chief Complaint: right shoulder  HPI: Kara Harvey is a 76 y.o. female who complains of pain in the right shoulder. Please see ED notes and H&P for details.  Past Medical History:  Diagnosis Date  . Hyperlipidemia   . Hypertension    Past Surgical History:  Procedure Laterality Date  . BREAST EXCISIONAL BIOPSY Left 1988   benign  . BREAST SURGERY Left 1986   for false positive/biopsy  . EYE SURGERY Left 6wks ago   cataract  . FRACTURE SURGERY Left 01/14/2011   ankle  . TONSILECTOMY, ADENOIDECTOMY, BILATERAL MYRINGOTOMY AND TUBES Bilateral 1957   does not believe adenoids were removed  . TUBAL LIGATION  y-37   Social History   Socioeconomic History  . Marital status: Married    Spouse name: None  . Number of children: None  . Years of education: None  . Highest education level: None  Social Needs  . Financial resource strain: None  . Food insecurity - worry: None  . Food insecurity - inability: None  . Transportation needs - medical: None  . Transportation needs - non-medical: None  Occupational History  . None  Tobacco Use  . Smoking status: Former Smoker    Packs/day: 2.00    Types: Cigarettes    Last attempt to quit: 03/28/1979    Years since quitting: 38.3  . Smokeless tobacco: Never Used  Substance and Sexual Activity  . Alcohol use: Yes    Alcohol/week: 3.0 oz    Types: 5 Glasses of wine per week  . Drug use: No  . Sexual activity: Yes  Other Topics Concern  . None  Social History Narrative  . None   Family History  Problem Relation Age of Onset  . Hyperlipidemia Mother   . Cancer Mother   . Arthritis Father   . Alzheimer's disease Father   . Colon cancer Paternal Aunt    Allergies  Allergen Reactions  . Sulfa Antibiotics Rash   Prior to Admission medications   Medication Sig Start Date End Date Taking? Authorizing Provider  atorvastatin (LIPITOR) 20 MG tablet  take 1 tablet by mouth once daily 06/28/17  Yes Crecencio Mc, MD  buPROPion [facility] SR) 150 MG 12 hr tablet take 1 tablet by mouth twice a day 06/07/17  Yes Crecencio Mc, MD  Cholecalciferol (VITAMIN D3) 1000 UNITS CAPS Take 1,000 Units by mouth daily.   Yes [provider]  lisinopril-hydrochlorothiazide (PRINZIDE,ZESTORETIC) 10-12.5 MG tablet take 1 tablet by mouth once daily 06/28/17  Yes Crecencio Mc, MD  diphenhydrAMINE (BENADRYL) 2 % cream Apply 1 application topically 3 (three) times daily as needed for itching. Reported on 01/23/2016    [provider]  docusate sodium (COLACE) 100 MG capsule Take 1 capsule (100 mg total) by mouth daily as needed. 07/13/17 07/13/18  Schuyler Amor, MD  ondansetron (ZOFRAN) 4 MG tablet Take 1 tablet (4 mg total) by mouth every 8 (eight) hours as needed for nausea or vomiting. 07/13/17   Schuyler Amor, MD  oxyCODONE-acetaminophen (ROXICET) 5-325 MG tablet Take 1 tablet by mouth every 6 (six) hours as needed. 07/13/17 07/13/18  Schuyler Amor, MD  valACYclovir (VALTREX) 1000 MG tablet take 2 tablets by mouth twice a day Patient not taking: Reported on 07/13/2017 06/07/17   Crecencio Mc, MD   Dg Humerus Right  Result Date: 07/13/2017 CLINICAL DATA:  Recent fall with right arm pain, initial  encounter EXAM: RIGHT HUMERUS - 2+ VIEW COMPARISON:  None. FINDINGS: There is a comminuted fracture of the proximal right humerus involving the surgical neck and extending superiorly into the humeral head. No definitive dislocation is seen. The remainder the humerus is within normal limits. IMPRESSION: Comminuted proximal right humeral fracture. Electronically Signed   By: Inez Catalina M.D.   On: 07/13/2017 18:29    Positive ROS: All other systems have been reviewed and were otherwise negative with the exception of those mentioned in the HPI and as above.  Physical Exam: General: Alert, no acute distress Cardiovascular: No pedal  edema Respiratory: No cyanosis, no use of accessory musculature GI: No organomegaly, abdomen is soft and non-tender Skin: No lesions in the area of chief complaint Neurologic: Sensation intact distally Psychiatric: Patient is competent for consent with normal mood and affect Lymphatic: No axillary or cervical lymphadenopathy  MUSCULOSKELETAL: tender along right shoulder girdle, moderate swelling Elbow, wrist and hand FROM and non-tender   Assessment: Right proximal humerus fracture   Plan: NWB right shoulder, sling, follow-up in my office in 10 to 14 days Physical therapy for gait training, possible hemi-walker Discharge planning when medically stable   Lovell Sheehan, MD    07/14/2017 8:19 AM

## 2017-07-14 NOTE — Progress Notes (Signed)
Patient complaining of acid reflux. Noted the pepcid she takes at home wasn't ordered. MD notified. Orders received.

## 2017-07-15 ENCOUNTER — Other Ambulatory Visit: Payer: Self-pay

## 2017-07-15 ENCOUNTER — Telehealth: Payer: Self-pay

## 2017-07-15 DIAGNOSIS — R55 Syncope and collapse: Secondary | ICD-10-CM | POA: Diagnosis not present

## 2017-07-15 DIAGNOSIS — E785 Hyperlipidemia, unspecified: Secondary | ICD-10-CM | POA: Diagnosis not present

## 2017-07-15 DIAGNOSIS — Z882 Allergy status to sulfonamides status: Secondary | ICD-10-CM | POA: Diagnosis not present

## 2017-07-15 DIAGNOSIS — E559 Vitamin D deficiency, unspecified: Secondary | ICD-10-CM | POA: Diagnosis not present

## 2017-07-15 DIAGNOSIS — M8580 Other specified disorders of bone density and structure, unspecified site: Secondary | ICD-10-CM | POA: Diagnosis not present

## 2017-07-15 DIAGNOSIS — R2689 Other abnormalities of gait and mobility: Secondary | ICD-10-CM | POA: Diagnosis not present

## 2017-07-15 DIAGNOSIS — S42411A Displaced simple supracondylar fracture without intercondylar fracture of right humerus, initial encounter for closed fracture: Secondary | ICD-10-CM | POA: Diagnosis not present

## 2017-07-15 DIAGNOSIS — M79601 Pain in right arm: Secondary | ICD-10-CM | POA: Diagnosis not present

## 2017-07-15 DIAGNOSIS — Z7401 Bed confinement status: Secondary | ICD-10-CM | POA: Diagnosis not present

## 2017-07-15 DIAGNOSIS — S42201D Unspecified fracture of upper end of right humerus, subsequent encounter for fracture with routine healing: Secondary | ICD-10-CM | POA: Diagnosis not present

## 2017-07-15 DIAGNOSIS — I10 Essential (primary) hypertension: Secondary | ICD-10-CM | POA: Diagnosis not present

## 2017-07-15 DIAGNOSIS — Z4689 Encounter for fitting and adjustment of other specified devices: Secondary | ICD-10-CM | POA: Diagnosis not present

## 2017-07-15 DIAGNOSIS — S42201A Unspecified fracture of upper end of right humerus, initial encounter for closed fracture: Secondary | ICD-10-CM | POA: Diagnosis not present

## 2017-07-15 DIAGNOSIS — R2681 Unsteadiness on feet: Secondary | ICD-10-CM | POA: Diagnosis not present

## 2017-07-15 DIAGNOSIS — S42291A Other displaced fracture of upper end of right humerus, initial encounter for closed fracture: Secondary | ICD-10-CM | POA: Diagnosis not present

## 2017-07-15 DIAGNOSIS — R110 Nausea: Secondary | ICD-10-CM | POA: Diagnosis not present

## 2017-07-15 DIAGNOSIS — Z87891 Personal history of nicotine dependence: Secondary | ICD-10-CM | POA: Diagnosis not present

## 2017-07-15 DIAGNOSIS — Z79899 Other long term (current) drug therapy: Secondary | ICD-10-CM | POA: Diagnosis not present

## 2017-07-15 DIAGNOSIS — W19XXXD Unspecified fall, subsequent encounter: Secondary | ICD-10-CM | POA: Diagnosis not present

## 2017-07-15 DIAGNOSIS — S42309A Unspecified fracture of shaft of humerus, unspecified arm, initial encounter for closed fracture: Secondary | ICD-10-CM | POA: Diagnosis not present

## 2017-07-15 DIAGNOSIS — I951 Orthostatic hypotension: Secondary | ICD-10-CM | POA: Diagnosis not present

## 2017-07-15 MED ORDER — OXYCODONE-ACETAMINOPHEN 5-325 MG PO TABS: 1.0000 | ORAL_TABLET | Freq: Four times a day (QID) | ORAL | 0 refills | Status: DC | PRN

## 2017-07-15 MED ORDER — POLYETHYLENE GLYCOL 3350 17 G PO PACK
17.0000 g | PACK | Freq: Every day | ORAL | Status: DC
  Administered 2017-07-15: 17 g via ORAL
  Filled 2017-07-15: qty 1

## 2017-07-15 NOTE — Care Management Obs Status (Signed)
[place] NOTIFICATION   Patient Details  Name: Kara Harvey MRN: [id number] Date of Birth: Jul 17, 1940   Medicare Observation Status Notification Given:  Yes    Jolly Mango, RN 07/15/2017, 9:54 AM

## 2017-07-15 NOTE — NC FL2 (Signed)
[place] LEVEL OF CARE SCREENING TOOL     IDENTIFICATION  Patient Name: Kara Harvey Birthdate: [birth year] Sex: female Admission Date (Current Location): 07/13/2017  [place] and [place] Number:  Engineering geologist and Address:  [facility], [address], [place], [place] [address]      Provider Number: [id number]  Attending Physician Name and Address:  Epifanio Lesches, MD  Relative Name and Phone Number:       Current Level of Care: Hospital Recommended Level of Care: [place] Prior Approval Number:    Date Approved/Denied:   PASRR Number: [id number])  Discharge Plan: SNF    Current Diagnoses: Patient Active Problem List   Diagnosis Date Noted  . Humerus fracture 07/13/2017  . Fracture of finger, left, closed 04/28/2017  . Cellulitis 12/11/2016  . Vitamin D deficiency 04/29/2016  . Osteopenia 04/26/2016  . Long-term use of high-risk medication 01/25/2016  . Depressive disorder 01/25/2016  . Vaginal lesion 06/09/2015  . Medicare annual wellness visit, subsequent 03/06/2014  . Closed malleolar fracture, unspecified laterality, with routine healing, subsequent encounter 03/04/2014  . Cataract extraction status of left eye 03/04/2014  . Obesity 09/05/2013  . Urge incontinence 03/27/2013  . Essential hypertension, benign 03/27/2013  . Hyperlipidemia LDL goal <130 03/27/2013    Orientation RESPIRATION BLADDER Height & Weight     Self, Time, Situation, Place  Normal Continent Weight: 177 lb 8 oz (80.5 kg) Height:  5\' 3"  (160 cm)  BEHAVIORAL SYMPTOMS/MOOD NEUROLOGICAL BOWEL NUTRITION STATUS      Continent Diet(Diet: Heart Healthy)  AMBULATORY STATUS COMMUNICATION OF NEEDS Skin   Extensive Assist Verbally Normal                       Personal Care Assistance Level of Assistance  Bathing, Feeding, Dressing Bathing Assistance: Limited assistance Feeding assistance: Independent Dressing  Assistance: Limited assistance     Functional Limitations Info  Sight, Hearing, Speech Sight Info: Adequate Hearing Info: Adequate Speech Info: Adequate    SPECIAL CARE FACTORS FREQUENCY  PT (By licensed PT), OT (By licensed OT)     PT Frequency: (5) OT Frequency: (5)            Contractures      Additional Factors Info  Code Status, Allergies Code Status Info: (Full Code. ) Allergies Info: (Sulfa Antibiotics)           Current Medications (07/15/2017):  This is the current hospital active medication list Current Facility-Administered Medications  Medication Dose Route Frequency Provider Last Rate Last Dose  . acetaminophen (TYLENOL) tablet 650 mg  650 mg Oral Q6H PRN Lance Coon, MD       Or  . acetaminophen (TYLENOL) suppository 650 mg  650 mg Rectal Q6H PRN Lance Coon, MD      . atorvastatin (LIPITOR) tablet 20 mg  20 mg Oral Daily Lance Coon, MD   20 mg at 07/15/17 0935  . buPROPion (WELLBUTRIN SR) 12 hr tablet 150 mg  150 mg Oral BID Lance Coon, MD   150 mg at 07/15/17 0935  . calcium carbonate (TUMS - dosed in mg elemental calcium) chewable tablet 200 mg of elemental calcium  1 tablet Oral TID PRN Lance Coon, MD   200 mg of elemental calcium at 07/14/17 1826  . enoxaparin (LOVENOX) injection 40 mg  40 mg Subcutaneous Q24H Lance Coon, MD   40 mg at 07/14/17 2039  . famotidine (PEPCID) tablet 20 mg  20 mg Oral BID Loney Hering D, MD   20 mg at 07/15/17 0935  . guaiFENesin-dextromethorphan (ROBITUSSIN DM) 100-10 MG/5ML syrup 5 mL  5 mL Oral Q4H PRN Lance Coon, MD   5 mL at 07/14/17 0000  . lisinopril (PRINIVIL,ZESTRIL) tablet 10 mg  10 mg Oral Daily Lance Coon, MD   10 mg at 07/15/17 0935  . morphine 2 MG/ML injection 2 mg  2 mg Intravenous Q4H PRN Lance Coon, MD      . ondansetron [facility]) tablet 4 mg  4 mg Oral Q6H PRN Lance Coon, MD   4 mg at 07/14/17 2039   Or  . ondansetron [facility]) injection 4 mg  4 mg Intravenous Q6H PRN Lance Coon, MD   4 mg at 07/14/17 0345  . oxyCODONE (Oxy IR/ROXICODONE) immediate release tablet 5 mg  5 mg Oral Q4H PRN Lance Coon, MD   5 mg at 07/15/17 [id number]  . phenol (CHLORASEPTIC) mouth spray 1 spray  1 spray Mouth/Throat PRN Lance Coon, MD      . phenol-menthol (CEPASTAT) lozenge 1 lozenge  1 lozenge Buccal PRN Lance Coon, MD   1 lozenge at 07/14/17 1626     Discharge Medications: Please see discharge summary for a list of discharge medications.  Relevant Imaging Results:  Relevant Lab Results:   Additional Information (SSN: [id number])  Doralyn Kirkes, Veronia Beets, LCSW

## 2017-07-15 NOTE — Progress Notes (Signed)
Physical Therapy Treatment Patient Details Name: Kara Harvey MRN: [id number] DOB: 22-Mar-1941 Today's Date: 07/15/2017    History of Present Illness Pt is a 76 y/o F who presented s/p fall at home with subsequent R humeral fx.  Ortho was consulted who decided non surgical management with pt in sling, NWB, no PROM/AROM RUE.  Pt's PMH includes L ankle fx surgery.     PT Comments    Pt made good progress with mobility this session, ambulating 80 ft with SPC with decreased gait speed but no signs of instability.  Pt and neighbor express significant anxiety about pt returning home telling this PT that pt's son will be "needy" and will make the home unsafe for the pt.  Pt instructed in safe technique to enter and exit home.  Educated pt in eliminating tripping hazards around home.  Pt not orthostatic today.  Recommendation for HHPT at d/c remains appropriate.    Follow Up Recommendations  Home health PT     Equipment Recommendations  None recommended by PT(pt has SPC at home)    Recommendations for Other Services       Precautions / Restrictions Precautions Precautions: Fall;Other (comment) Required Braces or Orthoses: Sling(around waste sling) Restrictions Weight Bearing Restrictions: Yes RUE Weight Bearing: Non weight bearing Other Position/Activity Restrictions: Per verbal from Dr. Vianne Bulls the pt is NWB RUE.  No PROM or AROM R shoulder.     Mobility  Bed Mobility               General bed mobility comments: Pt sitting in chair at start and end of session  Transfers Overall transfer level: Needs assistance Equipment used: None;Straight cane Transfers: Sit to/from Stand Sit to Stand: Min guard         General transfer comment: Pt requires increased time to scoot to edge of seat with cues for technique.  Question true effort placed by pt.  To stand pt is steady, requiring increased time but no physical assist.  Min guard for safety only.    Ambulation/Gait Ambulation/Gait assistance: Min guard Ambulation Distance (Feet): 80 Feet(40,40) Assistive device: Straight cane Gait Pattern/deviations: Step-through pattern;Decreased stride length Gait velocity: decreased Gait velocity interpretation: Below normal speed for age/gender General Gait Details: Dec Bil step length with guarded posture but no signs of instability. No physical assist required.  Cues to attempt to increase step length with no sign of effort put forth from pt.  Pt requested a seated rest break after ambulating 40 ft but did not offer a reason for the seated rest break.    Stairs Stairs: Yes   Stair Management: No rails;Forwards;Step to pattern Number of Stairs: 1 General stair comments: Mod assist via 1 person HHA to ascend/descend step.  See exercise section below for instruction in alternative method.   Wheelchair Mobility    Modified Rankin (Stroke Patients Only)       Balance Overall balance assessment: Needs assistance Sitting-balance support: No upper extremity supported;Feet supported Sitting balance-Leahy Scale: Good     Standing balance support: No upper extremity supported;During functional activity Standing balance-Leahy Scale: Fair Standing balance comment: Pt able to stand statically without UE support.  Benefits from use of SPC when ambulating.                             Cognition Arousal/Alertness: Awake/alert Behavior During Therapy: WFL for tasks assessed/performed Overall Cognitive Status: Within Functional Limits for tasks assessed  Exercises Other Exercises Other Exercises: Instructed pt in alternative way of entering home by sitting in chair rather than stepping up 1 step Other Exercises: Instructed pt safety in home to avoid trip hazards    General Comments General comments (skin integrity, edema, etc.): BP sitting at start of session 136/59, standing  132/68, seated after ambulating 40 ft 137/67      Pertinent Vitals/Pain Pain Assessment: 0-10 Pain Score: 5  Pain Location: R shoulder Pain Descriptors / Indicators: Aching;Guarding;Grimacing Pain Intervention(s): Limited activity within patient's tolerance;Monitored during session;Premedicated before session    Home Living                      Prior Function            PT Goals (current goals can now be found in the care plan section) Acute Rehab PT Goals Patient Stated Goal: to go to rehab PT Goal Formulation: With patient Time For Goal Achievement: 08/28/17 Potential to Achieve Goals: Good Progress towards PT goals: Progressing toward goals    Frequency    7X/week      PT Plan Current plan remains appropriate    Co-evaluation              AM-PAC PT "6 Clicks" Daily Activity  Outcome Measure  Difficulty turning over in bed (including adjusting bedclothes, sheets and blankets)?: Unable Difficulty moving from lying on back to sitting on the side of the bed? : Unable Difficulty sitting down on and standing up from a chair with arms (e.g., wheelchair, bedside commode, etc,.)?: A Little Help needed moving to and from a bed to chair (including a wheelchair)?: A Little Help needed walking in hospital room?: A Little Help needed climbing 3-5 steps with a railing? : A Little 6 Click Score: 14    End of Session Equipment Utilized During Treatment: Gait belt;Other (comment)(RUE around wait sling) Activity Tolerance: Patient tolerated treatment well Patient left: in chair;with call bell/phone within reach;with chair alarm set;with family/visitor present Nurse Communication: Mobility status PT Visit Diagnosis: Unsteadiness on feet (R26.81);Pain;Other abnormalities of gait and mobility (R26.89) Pain - Right/Left: Right Pain - part of body: Shoulder     Time: 3382-5053 PT Time Calculation (min) (ACUTE ONLY): 48 min  Charges:  $Gait Training: 38-52  mins                    G Codes:       Collie Siad PT, DPT 07/15/2017, 10:08 AM

## 2017-07-15 NOTE — Progress Notes (Signed)
[facility]S here to transport pt. 

## 2017-07-15 NOTE — Progress Notes (Signed)
Report called to Angela at [facility].  

## 2017-07-15 NOTE — Progress Notes (Signed)
Occupational Therapy Treatment Patient Details Name: Kara Harvey MRN: [id number] DOB: [birth year] Today's Date: 07/15/2017    History of present illness Pt is a 76 y/o F who presented s/p fall at home with subsequent R humeral fx.  Ortho was consulted who decided non surgical management with pt in sling, NWB, no PROM/AROM RUE.  Pt's PMH includes L ankle fx surgery.    OT comments  Pt seen for OT treatment this date. Pt's neighbor present for majority of session. Pt verbalized stress over caring for adult son with mental illness, was thankful for neighbor's ongoing support and assistance through her hospital stay. Pt reporting 11/10 RUE pain. Per RN, not yet time for additional pain medications. Pt educated in cognitive behavioral pain coping strategies to support better pain management. Pt verbalized understanding and noted feeling a difference after trialing with verbal cues for techniques stating she felt "more relaxed" and reported slight decrease in RUE pain. Pt then able to participate in transfer to [facility] for toileting with set up and min guard during hygiene. Pt will continue to benefit from skilled OT services to address noted impairments and functional deficits on order to maximize return to PLOF. Per pt's friend/neighbor, recently spoke to LCSW who indicated pt will be going to [facility] this afternoon. Will continue to progress during this hospitalization.   Follow Up Recommendations  Home health OT    Equipment Recommendations       Recommendations for Other Services      Precautions / Restrictions Precautions Precautions: Fall;Other (comment) Required Braces or Orthoses: Sling(around waist sling) Restrictions Weight Bearing Restrictions: Yes RUE Weight Bearing: Non weight bearing Other Position/Activity Restrictions: Per verbal from Dr. Vianne Bulls the pt is NWB RUE.  No PROM or AROM R shoulder.        Mobility Bed Mobility               General bed mobility comments:  deferred, up in recliner  Transfers Overall transfer level: Needs assistance Equipment used: None;Straight cane Transfers: Sit to/from Stand Sit to Stand: Min guard         General transfer comment: significantly increased time to scoott to edge of recliner with cues for technique to minimize RUE pain. Ultimately able to perform with min guard for safety    Balance Overall balance assessment: Needs assistance Sitting-balance support: No upper extremity supported;Feet supported Sitting balance-Leahy Scale: Good     Standing balance support: No upper extremity supported;During functional activity Standing balance-Leahy Scale: Fair Standing balance comment: Pt able to stand statically without UE support.  Benefits from use of SPC when ambulating.                            ADL either performed or assessed with clinical judgement   ADL Overall ADL's : Needs assistance/impaired     Grooming: Minimal assistance Grooming Details (indicate cue type and reason): all tasks one handed with non affected left hand                  Toilet Transfer: Supervision/safety;Set up;BSC Toilet Transfer Details (indicate cue type and reason): close SBA for transfer to [facility] from [facility] and Hygiene: Set up;Sit to/from stand;Min guard               Vision       Perception     Praxis      Cognition Arousal/Alertness: Awake/alert Behavior During Therapy: [facility]  for tasks assessed/performed Overall Cognitive Status: Within Functional Limits for tasks assessed                                          Exercises Exercises: Other exercises Other Exercises Other Exercises: Pt verbalized understanding of education provided in cognitive behavioral pain coping strategies to support better pain management. Pt verbalized feeling a difference after trialing with verbal cues for techniques.     Shoulder Instructions        General Comments     Pertinent Vitals/ Pain       Pain Assessment: 0-10 Pain Score: 10-Worst pain ever Pain Location: "11/10" in R shoulder Pain Descriptors / Indicators: Aching;Guarding;Grimacing;Sharp Pain Intervention(s): Limited activity within patient's tolerance;Monitored during session;Patient requesting pain meds-RN notified;Repositioned;Utilized relaxation techniques  Home Living                                          Prior Functioning/Environment              Frequency  Min 1X/week        Progress Toward Goals  OT Goals(current goals can now be found in the care plan section)  Progress towards OT goals: Progressing toward goals  Acute Rehab OT Goals Patient Stated Goal: to go to rehab OT Goal Formulation: With patient  Plan Discharge plan remains appropriate;Frequency remains appropriate    Co-evaluation                 AM-PAC PT "6 Clicks" Daily Activity     Outcome Measure   Help from another person eating meals?: A Little Help from another person taking care of personal grooming?: A Little Help from another person toileting, which includes using toliet, bedpan, or urinal?: A Little Help from another person bathing (including washing, rinsing, drying)?: A Lot Help from another person to put on and taking off regular upper body clothing?: A Lot Help from another person to put on and taking off regular lower body clothing?: A Little 6 Click Score: 16    End of Session    OT Visit Diagnosis: Pain;Muscle weakness (generalized) (M62.81) Pain - Right/Left: Right Pain - part of body: Arm   Activity Tolerance Patient limited by pain;Patient tolerated treatment well   Patient Left in chair;with call bell/phone within reach;with chair alarm set;with family/visitor present   Nurse Communication Patient requests pain meds    Functional Assessment Tool Used: AM-PAC 6 Clicks Daily Activity;Clinical judgement Functional  Limitation: Self care Self Care Current Status (A8341): At least 40 percent but less than 60 percent impaired, limited or restricted Self Care Goal Status (D6222): At least 1 percent but less than 20 percent impaired, limited or restricted   Time: 1002-1048 OT Time Calculation (min): 46 min  Charges: OT G-codes **NOT FOR INPATIENT CLASS** Functional Assessment Tool Used: AM-PAC 6 Clicks Daily Activity;Clinical judgement Functional Limitation: Self care Self Care Current Status (L7989): At least 40 percent but less than 60 percent impaired, limited or restricted Self Care Goal Status (Q1194): At least 1 percent but less than 20 percent impaired, limited or restricted OT General Charges $OT Visit: 1 Visit OT Treatments $Self Care/Home Management : 23-37 mins $Therapeutic Activity: 8-22 mins  Jeni Salles, MPH, MS, OTR/L ascom [phone] 07/15/17, 11:07 AM

## 2017-07-15 NOTE — Progress Notes (Signed)
EMS called for transportation.  

## 2017-07-15 NOTE — Telephone Encounter (Signed)
Patient is going to SNF will be discharged later today.

## 2017-07-15 NOTE — Clinical Social Work Note (Signed)
Clinical Social Work Assessment  Patient Details  Name: Kara Harvey MRN: [id number] Date of Birth: 12/05/1940  Date of referral:  07/15/17               Reason for consult:  Facility Placement                Permission sought to share information with:  Chartered certified accountant granted to share information::  Yes, Verbal Permission Granted  Name::      [place]::   [place]   Relationship::     Contact Information:     Housing/Transportation Living arrangements for the past 2 months:  [place] of Information:  Patient, Other (Comment Required)(Friend [name]. ) Patient Interpreter Needed:  None Criminal Activity/Legal Involvement Pertinent to Current Situation/Hospitalization:  No - Comment as needed Significant Relationships:  Spouse, Friend Lives with:  Spouse Do you feel safe going back to the place where you live?  Yes Need for family participation in patient care:  Yes (Comment)  Care giving concerns: Patient lives in [place] with her husband Richard.    Social Worker assessment / plan: Holiday representative ([place]) received verbal consult from Chief Strategy Officer that patient is requesting to go to [facility] for short term rehab because she doesn't have a caregiver and her husband is currently at [facility]. PT is recommending home health. CSW met with patient and her friend [name] was at bedside. Per [name] she is going out of the country tomorrow and will not be available to assist patient. Patient reported that she wants to go to [facility] for rehab and is willing to pay privately if needed. Per [facility] admissions coordinator at [facility] they can accept patient today for rehab and will bill her [facility] first and not make the patient pay money up front. Patient is agreeable to going to [facility] today and understands that she may still have to pay out of pocket for SNF.  RN will call  report and arrange EMS for transport. CSW sent D/C orders to [facility] via [place]. Patient and her friend [name] are aware of above. Please reconsult if future social work needs arise. CSW signing off.    Employment status:  Retired Nurse, adult PT Recommendations:  Home with [place] / Referral to community resources:  [place]  Patient/Family's Response to care:  Patient is agreeable to go to [facility] today.   Patient/Family's Understanding of and Emotional Response to Diagnosis, Current Treatment, and Prognosis:  Patient and her friend [name] were very pleasant and thanked CSW for assistance.   Emotional Assessment Appearance:  Appears stated age Attitude/Demeanor/Rapport:    Affect (typically observed):  Accepting, Adaptable, Pleasant Orientation:  Oriented to Self, Oriented to Place, Oriented to  Time, Oriented to Situation Alcohol / Substance use:  Not Applicable Psych involvement (Current and /or in the community):  No (Comment)  Discharge Needs  Concerns to be addressed:  Discharge Planning Concerns Readmission within the last 30 days:  No Current discharge risk:  Dependent with Mobility Barriers to Discharge:  Continued Medical Work up   [employer], Veronia Beets, LCSW 07/15/2017, 4:22 PM

## 2017-07-15 NOTE — Discharge Summary (Signed)
Kara Harvey, is a 76 y.o. female  DOB June 11, 1941  MRN [id number].  Admission date:  07/13/2017  Admitting Physician  Kara Coon, MD  Discharge Date:  07/15/2017   Primary MD  Kara Mc, MD  Recommendations for primary care physician for things to follow:  Follow-up with PCP in 1 week Follow-up with t orthopedic Dr. Marica Harvey in 2 weeks.   Admission Diagnosis  Closed fracture of proximal end of right humerus, unspecified fracture morphology, initial encounter [S42.201A]   Discharge Diagnosis  Closed fracture of proximal end of right humerus, unspecified fracture morphology, initial encounter [S42.201A]    Principal Problem:   Humerus fracture Active Problems:   Essential hypertension, benign   Hyperlipidemia LDL goal <130      Past Medical History:  Diagnosis Date  . Hyperlipidemia   . Hypertension     Past Surgical History:  Procedure Laterality Date  . BREAST EXCISIONAL BIOPSY Left 1988   benign  . BREAST SURGERY Left 1986   for false positive/biopsy  . EYE SURGERY Left 6wks ago   cataract  . FRACTURE SURGERY Left 01/14/2011   ankle  . TONSILECTOMY, ADENOIDECTOMY, BILATERAL MYRINGOTOMY AND TUBES Bilateral 1957   does not believe adenoids were removed  . TUBAL LIGATION  y-37       History of present illness and  Hospital Course:     Kindly see H&P for history of present illness and admission details, please review complete Labs, Consult reports and Test reports for all details in brief  HPI  from the history and physical done on the day of admission 76 year old female patient admitted because of syncope.  Patient to ER yesterday after a fall and found to have right humerus fracture, discharged from the emergency room by discharge lobby patient had a syncopal event so admitted to medical  service.   Hospital Course  Vasovagal syncope: Admitted to medical service started on IV fluids, patient feels much better today.  Patient had nausea likely due to narcotics.  Comminuted fracture of right shoulder.  Seen by orthopedic, continue conservative management with sling, follow-up with x-rays of the right shoulder 2 weeks\,follow [facility] with [facility],   She is going to [place] today.  Patient is afraid to live by herself because of her humerus fracture and pain, her husband already [employer] /  Essential hypertension: Controlled, continue home medicines.,  Orthostatic hypotension yesterday, held diuretic.  But today she feels well, discharge to nursing home with her home BP medicines.     Discharge Condition: Stable   Follow UP  Follow-up Information    Kara Mc, MD. Schedule an appointment as soon as possible for a visit in 2 days.   Specialty:  Internal Medicine Contact information: [place] [address] [phone]        Kara Sheehan, MD. Schedule an appointment as soon as possible for a visit in 2 days.   Specialty:  Orthopedic Surgery Contact information: [place] [address] [phone]             Discharge Instructions  and  Discharge Medications      Allergies as of 07/15/2017      Reactions   Sulfa Antibiotics Rash      Medication List    TAKE these medications   atorvastatin 20 MG tablet Commonly known as:  LIPITOR take 1 tablet by mouth once daily   buPROPion 150 MG 12  hr tablet Commonly known as:  WELLBUTRIN SR take 1 tablet by mouth twice a day   calcium carbonate 500 MG chewable tablet Commonly known as:  TUMS - dosed in mg elemental calcium Chew 1 tablet (200 mg of elemental calcium total) by mouth 3 (three) times daily as needed for indigestion or heartburn.   diphenhydrAMINE 2 % cream Commonly known as:  BENADRYL Apply 1 application topically 3  (three) times daily as needed for itching. Reported on 01/23/2016   docusate sodium 100 MG capsule Commonly known as:  COLACE Take 1 capsule (100 mg total) by mouth daily as needed.   famotidine 20 MG tablet Commonly known as:  PEPCID Take 20 mg by mouth 2 (two) times daily.   lisinopril-hydrochlorothiazide 10-12.5 MG tablet Commonly known as:  PRINZIDE,ZESTORETIC take 1 tablet by mouth once daily   ondansetron 4 MG tablet Commonly known as:  ZOFRAN Take 1 tablet (4 mg total) by mouth every 8 (eight) hours as needed for nausea or vomiting.   oxyCODONE-acetaminophen 5-325 MG tablet Commonly known as:  ROXICET Take 1 tablet by mouth every 6 (six) hours as needed.   valACYclovir 1000 MG tablet Commonly known as:  VALTREX take 2 tablets by mouth twice a day   Vitamin D3 1000 units Caps Take 1,000 Units by mouth daily.         Diet and Activity recommendation: See Discharge Instructions above   Consults obtained -. Orthopedic  Major procedures and Radiology Reports - PLEASE review detailed and final reports for all details, in brief -      Dg Humerus Right  Result Date: 07/13/2017 CLINICAL DATA:  Recent fall with right arm pain, initial encounter EXAM: RIGHT HUMERUS - 2+ VIEW COMPARISON:  None. FINDINGS: There is a comminuted fracture of the proximal right humerus involving the surgical neck and extending superiorly into the humeral head. No definitive dislocation is seen. The remainder the humerus is within normal limits. IMPRESSION: Comminuted proximal right humeral fracture. Electronically Signed   By: Kara Harvey M.D.   On: 07/13/2017 18:29    Micro Results     No results found for this or any previous visit (from the past 240 hour(s)).     Today   Subjective:   Kara Harvey today has no headache,no chest abdominal pain,no new weakness tingling or numbness, feels much better wants to go home today.   Objective:   Blood pressure (!) 142/60, pulse  91, temperature 98.6 F (37 C), temperature source Oral, resp. rate 18, height 5\' 3"  (1.6 m), weight 80.5 kg (177 lb 8 oz), SpO2 94 %.   Intake/Output Summary (Last 24 hours) at 07/15/2017 1019 Last data filed at 07/15/2017 0844 Gross per 24 hour  Intake 980 ml  Output 2600 ml  Net -1620 ml    Exam Awake Alert, Oriented x 3, No new F.N deficits, Normal affect [place].AT,PERRAL Supple Neck,No JVD, No cervical lymphadenopathy appriciated.  Symmetrical Chest wall movement, Good air movement bilaterally, CTAB RRR,No Gallops,Rubs or new Murmurs, No Parasternal Heave +ve B.Sounds, Abd Soft, Non tender, No organomegaly appriciated, No rebound -guarding or rigidity. No Cyanosis, Clubbing or edema, No new Rash or bruise  Data Review   CBC w Diff:  Lab Results  Component Value Date   WBC 12.7 (H) 07/14/2017   HGB 11.4 (L) 07/14/2017   HCT 33.3 (L) 07/14/2017   PLT 270 07/14/2017   LYMPHOPCT 10 07/13/2017   MONOPCT 6 07/13/2017   EOSPCT 0 07/13/2017  BASOPCT 0 07/13/2017    CMP:  Lab Results  Component Value Date   NA 130 (L) 07/14/2017   K 3.7 07/14/2017   K 3.6 12/18/2013   CL 98 (L) 07/14/2017   CO2 20 (L) 07/14/2017   BUN 11 07/14/2017   CREATININE 0.71 07/14/2017   PROT 6.5 07/13/2017   ALBUMIN 3.9 07/13/2017   BILITOT 1.0 07/13/2017   ALKPHOS 63 07/13/2017   AST 25 07/13/2017   ALT 19 07/13/2017  .   Total Time in preparing paper work, data evaluation and todays exam - 35 minutes  Epifanio Lesches M.D on 07/15/2017 at 10:19 AM    Note: This dictation was prepared with Dragon dictation along with smaller phrase technology. Any transcriptional errors that result from this process are unintentional.

## 2017-07-15 NOTE — Telephone Encounter (Signed)
Copied from [place] [phone]. Topic: Appointment Scheduling - Scheduling Inquiry for Clinic >> Jul 15, 2017 12:18 PM Oliver Pila B wrote: Reason for CRM: pt is needing a hos f/u [facility] days w/ pcp, but no slots available, contact pt to schedule

## 2017-07-15 NOTE — Clinical Social Work Placement (Signed)
   CLINICAL SOCIAL WORK PLACEMENT  NOTE  Date:  07/15/2017  Patient Details  Name: Kara Harvey MRN: [id number] Date of Birth: Feb 23, 1941  Clinical Social Work is seeking post-discharge placement for this patient at the [place] level of care (*CSW will initial, date and re-position this form in  chart as items are completed):  Yes   Patient/family provided with [place] Work Department's list of facilities offering this level of care within the geographic area requested by the patient (or if unable, by the patient's family).  Yes   Patient/family informed of their freedom to choose among providers that offer the needed level of care, that participate in Medicare, Medicaid or managed care program needed by the patient, have an available bed and are willing to accept the patient.  Yes   Patient/family informed of [place]'s ownership interest in [facility] and [facility], as well as of the fact that they are under no obligation to receive care at these facilities.  PASRR submitted to EDS on 07/15/17     PASRR number received on 07/15/17     Existing PASRR number confirmed on       FL2 transmitted to all facilities in geographic area requested by pt/family on 07/15/17     FL2 transmitted to all facilities within larger geographic area on       Patient informed that his/her managed care company has contracts with or will negotiate with certain facilities, including the following:        Yes   Patient/family informed of bed offers received.  Patient chooses bed at [facility] )     Physician recommends and patient chooses bed at      Patient to be transferred to [facility] ) on 07/15/17.  Patient to be transferred to facility by [facility] )     Patient family notified on 07/15/17 of transfer.  Name of family member notified:  (Patient's friend [name] is at bedside and aware of D/C today. )      PHYSICIAN       Additional Comment:    _______________________________________________ Saida Lonon, Veronia Beets, LCSW 07/15/2017, 4:21 PM

## 2017-07-17 DIAGNOSIS — I10 Essential (primary) hypertension: Secondary | ICD-10-CM | POA: Diagnosis not present

## 2017-07-17 DIAGNOSIS — S42411A Displaced simple supracondylar fracture without intercondylar fracture of right humerus, initial encounter for closed fracture: Secondary | ICD-10-CM | POA: Diagnosis not present

## 2017-07-17 DIAGNOSIS — S42201A Unspecified fracture of upper end of right humerus, initial encounter for closed fracture: Secondary | ICD-10-CM | POA: Diagnosis not present

## 2017-07-17 DIAGNOSIS — R55 Syncope and collapse: Secondary | ICD-10-CM | POA: Diagnosis not present

## 2017-07-17 NOTE — Telephone Encounter (Signed)
DC to [facility]

## 2017-07-25 DIAGNOSIS — S42201A Unspecified fracture of upper end of right humerus, initial encounter for closed fracture: Secondary | ICD-10-CM | POA: Diagnosis not present

## 2017-08-06 ENCOUNTER — Telehealth: Payer: Self-pay | Admitting: Internal Medicine

## 2017-08-06 DIAGNOSIS — E785 Hyperlipidemia, unspecified: Secondary | ICD-10-CM | POA: Diagnosis not present

## 2017-08-06 DIAGNOSIS — S42411D Displaced simple supracondylar fracture without intercondylar fracture of right humerus, subsequent encounter for fracture with routine healing: Secondary | ICD-10-CM | POA: Diagnosis not present

## 2017-08-06 DIAGNOSIS — I10 Essential (primary) hypertension: Secondary | ICD-10-CM | POA: Diagnosis not present

## 2017-08-06 DIAGNOSIS — Z9181 History of falling: Secondary | ICD-10-CM | POA: Diagnosis not present

## 2017-08-06 NOTE — Telephone Encounter (Signed)
Kara Harvey was giving verbal orders as per Dr. Derrel Nip.

## 2017-08-06 NOTE — Telephone Encounter (Signed)
[place] Campbell nurse with [facility] - [phone]   Called in to request verbal orders for skilled nursing for 1 month for BP monitoring and PT, OT.   Please advise (okay to lvm)

## 2017-08-08 ENCOUNTER — Telehealth: Payer: Self-pay | Admitting: Internal Medicine

## 2017-08-08 NOTE — Telephone Encounter (Signed)
LMTCB. Please transfer Kara Harvey from [facility] [place] [facility] to our office.

## 2017-08-08 NOTE — Telephone Encounter (Signed)
Copied from [place]. Topic: General - Other >> Aug 08, 2017  8:49 AM Yvette Rack wrote: Reason for CRM: Bobbe Medico from [facility] [phone] is wanting verbal orders for patient to have physical therapy twice a week for 4 weeks

## 2017-08-08 NOTE — Telephone Encounter (Signed)
Please advise 

## 2017-08-09 DIAGNOSIS — I10 Essential (primary) hypertension: Secondary | ICD-10-CM | POA: Diagnosis not present

## 2017-08-09 DIAGNOSIS — Z9181 History of falling: Secondary | ICD-10-CM | POA: Diagnosis not present

## 2017-08-09 DIAGNOSIS — E785 Hyperlipidemia, unspecified: Secondary | ICD-10-CM | POA: Diagnosis not present

## 2017-08-09 DIAGNOSIS — S42411D Displaced simple supracondylar fracture without intercondylar fracture of right humerus, subsequent encounter for fracture with routine healing: Secondary | ICD-10-CM | POA: Diagnosis not present

## 2017-08-12 DIAGNOSIS — I10 Essential (primary) hypertension: Secondary | ICD-10-CM | POA: Diagnosis not present

## 2017-08-12 DIAGNOSIS — S42411D Displaced simple supracondylar fracture without intercondylar fracture of right humerus, subsequent encounter for fracture with routine healing: Secondary | ICD-10-CM | POA: Diagnosis not present

## 2017-08-12 DIAGNOSIS — E785 Hyperlipidemia, unspecified: Secondary | ICD-10-CM | POA: Diagnosis not present

## 2017-08-12 DIAGNOSIS — Z9181 History of falling: Secondary | ICD-10-CM | POA: Diagnosis not present

## 2017-08-13 DIAGNOSIS — Z9181 History of falling: Secondary | ICD-10-CM | POA: Diagnosis not present

## 2017-08-13 DIAGNOSIS — S42411D Displaced simple supracondylar fracture without intercondylar fracture of right humerus, subsequent encounter for fracture with routine healing: Secondary | ICD-10-CM | POA: Diagnosis not present

## 2017-08-13 DIAGNOSIS — I10 Essential (primary) hypertension: Secondary | ICD-10-CM | POA: Diagnosis not present

## 2017-08-13 DIAGNOSIS — E785 Hyperlipidemia, unspecified: Secondary | ICD-10-CM | POA: Diagnosis not present

## 2017-08-13 NOTE — Telephone Encounter (Signed)
Spoke with Latoya at [facility] and gave her the verbal orders for the pt to receive physical therapy twice a week for 4 weeks.

## 2017-08-14 DIAGNOSIS — S42411D Displaced simple supracondylar fracture without intercondylar fracture of right humerus, subsequent encounter for fracture with routine healing: Secondary | ICD-10-CM | POA: Diagnosis not present

## 2017-08-14 DIAGNOSIS — Z9181 History of falling: Secondary | ICD-10-CM | POA: Diagnosis not present

## 2017-08-14 DIAGNOSIS — E785 Hyperlipidemia, unspecified: Secondary | ICD-10-CM | POA: Diagnosis not present

## 2017-08-14 DIAGNOSIS — I10 Essential (primary) hypertension: Secondary | ICD-10-CM | POA: Diagnosis not present

## 2017-08-15 DIAGNOSIS — Z9181 History of falling: Secondary | ICD-10-CM | POA: Diagnosis not present

## 2017-08-15 DIAGNOSIS — E785 Hyperlipidemia, unspecified: Secondary | ICD-10-CM | POA: Diagnosis not present

## 2017-08-15 DIAGNOSIS — I10 Essential (primary) hypertension: Secondary | ICD-10-CM | POA: Diagnosis not present

## 2017-08-15 DIAGNOSIS — S42411D Displaced simple supracondylar fracture without intercondylar fracture of right humerus, subsequent encounter for fracture with routine healing: Secondary | ICD-10-CM | POA: Diagnosis not present

## 2017-08-16 DIAGNOSIS — E785 Hyperlipidemia, unspecified: Secondary | ICD-10-CM | POA: Diagnosis not present

## 2017-08-16 DIAGNOSIS — I10 Essential (primary) hypertension: Secondary | ICD-10-CM | POA: Diagnosis not present

## 2017-08-16 DIAGNOSIS — Z9181 History of falling: Secondary | ICD-10-CM | POA: Diagnosis not present

## 2017-08-16 DIAGNOSIS — S42411D Displaced simple supracondylar fracture without intercondylar fracture of right humerus, subsequent encounter for fracture with routine healing: Secondary | ICD-10-CM | POA: Diagnosis not present

## 2017-08-19 DIAGNOSIS — E785 Hyperlipidemia, unspecified: Secondary | ICD-10-CM | POA: Diagnosis not present

## 2017-08-19 DIAGNOSIS — S42411D Displaced simple supracondylar fracture without intercondylar fracture of right humerus, subsequent encounter for fracture with routine healing: Secondary | ICD-10-CM | POA: Diagnosis not present

## 2017-08-19 DIAGNOSIS — Z9181 History of falling: Secondary | ICD-10-CM | POA: Diagnosis not present

## 2017-08-19 DIAGNOSIS — I10 Essential (primary) hypertension: Secondary | ICD-10-CM | POA: Diagnosis not present

## 2017-08-20 DIAGNOSIS — I10 Essential (primary) hypertension: Secondary | ICD-10-CM | POA: Diagnosis not present

## 2017-08-20 DIAGNOSIS — S42411D Displaced simple supracondylar fracture without intercondylar fracture of right humerus, subsequent encounter for fracture with routine healing: Secondary | ICD-10-CM | POA: Diagnosis not present

## 2017-08-20 DIAGNOSIS — S42214D Unspecified nondisplaced fracture of surgical neck of right humerus, subsequent encounter for fracture with routine healing: Secondary | ICD-10-CM | POA: Diagnosis not present

## 2017-08-20 DIAGNOSIS — Z9181 History of falling: Secondary | ICD-10-CM | POA: Diagnosis not present

## 2017-08-20 DIAGNOSIS — E785 Hyperlipidemia, unspecified: Secondary | ICD-10-CM | POA: Diagnosis not present

## 2017-08-21 DIAGNOSIS — E785 Hyperlipidemia, unspecified: Secondary | ICD-10-CM | POA: Diagnosis not present

## 2017-08-21 DIAGNOSIS — S42411D Displaced simple supracondylar fracture without intercondylar fracture of right humerus, subsequent encounter for fracture with routine healing: Secondary | ICD-10-CM | POA: Diagnosis not present

## 2017-08-21 DIAGNOSIS — Z9181 History of falling: Secondary | ICD-10-CM | POA: Diagnosis not present

## 2017-08-21 DIAGNOSIS — I10 Essential (primary) hypertension: Secondary | ICD-10-CM | POA: Diagnosis not present

## 2017-08-23 DIAGNOSIS — S42411D Displaced simple supracondylar fracture without intercondylar fracture of right humerus, subsequent encounter for fracture with routine healing: Secondary | ICD-10-CM | POA: Diagnosis not present

## 2017-08-23 DIAGNOSIS — I10 Essential (primary) hypertension: Secondary | ICD-10-CM | POA: Diagnosis not present

## 2017-08-23 DIAGNOSIS — E785 Hyperlipidemia, unspecified: Secondary | ICD-10-CM | POA: Diagnosis not present

## 2017-08-23 DIAGNOSIS — Z9181 History of falling: Secondary | ICD-10-CM | POA: Diagnosis not present

## 2017-08-26 DIAGNOSIS — S42411D Displaced simple supracondylar fracture without intercondylar fracture of right humerus, subsequent encounter for fracture with routine healing: Secondary | ICD-10-CM | POA: Diagnosis not present

## 2017-08-26 DIAGNOSIS — Z9181 History of falling: Secondary | ICD-10-CM | POA: Diagnosis not present

## 2017-08-26 DIAGNOSIS — I10 Essential (primary) hypertension: Secondary | ICD-10-CM | POA: Diagnosis not present

## 2017-08-26 DIAGNOSIS — E785 Hyperlipidemia, unspecified: Secondary | ICD-10-CM | POA: Diagnosis not present

## 2017-08-27 DIAGNOSIS — E785 Hyperlipidemia, unspecified: Secondary | ICD-10-CM | POA: Diagnosis not present

## 2017-08-27 DIAGNOSIS — Z9181 History of falling: Secondary | ICD-10-CM | POA: Diagnosis not present

## 2017-08-27 DIAGNOSIS — S42411D Displaced simple supracondylar fracture without intercondylar fracture of right humerus, subsequent encounter for fracture with routine healing: Secondary | ICD-10-CM | POA: Diagnosis not present

## 2017-08-27 DIAGNOSIS — I10 Essential (primary) hypertension: Secondary | ICD-10-CM | POA: Diagnosis not present

## 2017-08-28 DIAGNOSIS — S42411D Displaced simple supracondylar fracture without intercondylar fracture of right humerus, subsequent encounter for fracture with routine healing: Secondary | ICD-10-CM | POA: Diagnosis not present

## 2017-08-28 DIAGNOSIS — Z9181 History of falling: Secondary | ICD-10-CM | POA: Diagnosis not present

## 2017-08-28 DIAGNOSIS — I10 Essential (primary) hypertension: Secondary | ICD-10-CM | POA: Diagnosis not present

## 2017-08-28 DIAGNOSIS — E785 Hyperlipidemia, unspecified: Secondary | ICD-10-CM | POA: Diagnosis not present

## 2017-08-30 DIAGNOSIS — Z9181 History of falling: Secondary | ICD-10-CM | POA: Diagnosis not present

## 2017-08-30 DIAGNOSIS — E785 Hyperlipidemia, unspecified: Secondary | ICD-10-CM | POA: Diagnosis not present

## 2017-08-30 DIAGNOSIS — I10 Essential (primary) hypertension: Secondary | ICD-10-CM | POA: Diagnosis not present

## 2017-08-30 DIAGNOSIS — S42411D Displaced simple supracondylar fracture without intercondylar fracture of right humerus, subsequent encounter for fracture with routine healing: Secondary | ICD-10-CM | POA: Diagnosis not present

## 2017-09-03 DIAGNOSIS — E785 Hyperlipidemia, unspecified: Secondary | ICD-10-CM | POA: Diagnosis not present

## 2017-09-03 DIAGNOSIS — Z9181 History of falling: Secondary | ICD-10-CM | POA: Diagnosis not present

## 2017-09-03 DIAGNOSIS — I10 Essential (primary) hypertension: Secondary | ICD-10-CM | POA: Diagnosis not present

## 2017-09-03 DIAGNOSIS — S42411D Displaced simple supracondylar fracture without intercondylar fracture of right humerus, subsequent encounter for fracture with routine healing: Secondary | ICD-10-CM | POA: Diagnosis not present

## 2017-09-06 DIAGNOSIS — E785 Hyperlipidemia, unspecified: Secondary | ICD-10-CM | POA: Diagnosis not present

## 2017-09-06 DIAGNOSIS — Z9181 History of falling: Secondary | ICD-10-CM | POA: Diagnosis not present

## 2017-09-06 DIAGNOSIS — S42411D Displaced simple supracondylar fracture without intercondylar fracture of right humerus, subsequent encounter for fracture with routine healing: Secondary | ICD-10-CM | POA: Diagnosis not present

## 2017-09-06 DIAGNOSIS — I10 Essential (primary) hypertension: Secondary | ICD-10-CM | POA: Diagnosis not present

## 2017-10-22 ENCOUNTER — Telehealth: Payer: Self-pay | Admitting: Radiology

## 2017-10-22 DIAGNOSIS — E871 Hypo-osmolality and hyponatremia: Secondary | ICD-10-CM

## 2017-10-22 DIAGNOSIS — E559 Vitamin D deficiency, unspecified: Secondary | ICD-10-CM

## 2017-10-22 DIAGNOSIS — D649 Anemia, unspecified: Secondary | ICD-10-CM

## 2017-10-22 DIAGNOSIS — E785 Hyperlipidemia, unspecified: Secondary | ICD-10-CM

## 2017-10-22 NOTE — Telephone Encounter (Signed)
Pt coming in for labs tomorrow, please place future orders. Thank you.  

## 2017-10-22 NOTE — Telephone Encounter (Signed)
Fasting labs ordered

## 2017-10-22 NOTE — Addendum Note (Signed)
Addended by: Crecencio Mc on: 10/22/2017 08:34 AM   Modules accepted: Orders

## 2017-10-23 ENCOUNTER — Other Ambulatory Visit (INDEPENDENT_AMBULATORY_CARE_PROVIDER_SITE_OTHER): Payer: Medicare Other

## 2017-10-23 DIAGNOSIS — D649 Anemia, unspecified: Secondary | ICD-10-CM | POA: Diagnosis not present

## 2017-10-23 DIAGNOSIS — E871 Hypo-osmolality and hyponatremia: Secondary | ICD-10-CM | POA: Diagnosis not present

## 2017-10-23 DIAGNOSIS — E559 Vitamin D deficiency, unspecified: Secondary | ICD-10-CM

## 2017-10-23 DIAGNOSIS — E785 Hyperlipidemia, unspecified: Secondary | ICD-10-CM | POA: Diagnosis not present

## 2017-10-23 LAB — CBC WITH DIFFERENTIAL/PLATELET
BASOS PCT: 1.1 % (ref 0.0–3.0)
Basophils Absolute: 0.1 10*3/uL (ref 0.0–0.1)
EOS PCT: 6.6 % — AB (ref 0.0–5.0)
Eosinophils Absolute: 0.5 10*3/uL (ref 0.0–0.7)
HEMATOCRIT: 39.3 % (ref 36.0–46.0)
HEMOGLOBIN: 13.2 g/dL (ref 12.0–15.0)
LYMPHS PCT: 29.6 % (ref 12.0–46.0)
Lymphs Abs: 2.3 10*3/uL (ref 0.7–4.0)
MCHC: 33.5 g/dL (ref 30.0–36.0)
MCV: 89 fl (ref 78.0–100.0)
MONO ABS: 0.8 10*3/uL (ref 0.1–1.0)
MONOS PCT: 10.5 % (ref 3.0–12.0)
Neutro Abs: 4 10*3/uL (ref 1.4–7.7)
Neutrophils Relative %: 52.2 % (ref 43.0–77.0)
Platelets: 333 10*3/uL (ref 150.0–400.0)
RBC: 4.41 Mil/uL (ref 3.87–5.11)
RDW: 13.5 % (ref 11.5–15.5)
WBC: 7.8 10*3/uL (ref 4.0–10.5)

## 2017-10-23 LAB — LIPID PANEL
CHOLESTEROL: 158 mg/dL (ref 0–200)
HDL: 84.2 mg/dL (ref >39.00)
LDL Cholesterol: 66 mg/dL (ref 0–99)
NONHDL: 74.25
Total CHOL/HDL Ratio: 2
Triglycerides: 43 mg/dL (ref 0.0–149.0)
VLDL: 8.6 mg/dL (ref 0.0–40.0)

## 2017-10-23 LAB — COMPREHENSIVE METABOLIC PANEL
ALBUMIN: 4.1 g/dL (ref 3.5–5.2)
ALK PHOS: 72 U/L (ref 39–117)
ALT: 16 U/L (ref 0–35)
AST: 17 U/L (ref 0–37)
BUN: 17 mg/dL (ref 6–23)
CHLORIDE: 97 meq/L (ref 96–112)
CO2: 27 mEq/L (ref 19–32)
Calcium: 9.4 mg/dL (ref 8.4–10.5)
Creatinine, Ser: 0.83 mg/dL (ref 0.40–1.20)
GFR: 71.18 mL/min (ref >60.00)
Glucose, Bld: 93 mg/dL (ref 70–99)
POTASSIUM: 4 meq/L (ref 3.5–5.1)
SODIUM: 134 meq/L — AB (ref 135–145)
TOTAL PROTEIN: 6.9 g/dL (ref 6.0–8.3)
Total Bilirubin: 0.5 mg/dL (ref 0.2–1.2)

## 2017-10-23 LAB — VITAMIN D 25 HYDROXY (VIT D DEFICIENCY, FRACTURES): VITD: 67.79 ng/mL (ref 30.00–100.00)

## 2017-10-25 ENCOUNTER — Ambulatory Visit (INDEPENDENT_AMBULATORY_CARE_PROVIDER_SITE_OTHER): Payer: Medicare Other | Admitting: Internal Medicine

## 2017-10-25 ENCOUNTER — Other Ambulatory Visit: Payer: Self-pay

## 2017-10-25 ENCOUNTER — Encounter: Payer: Self-pay | Admitting: Internal Medicine

## 2017-10-25 VITALS — BP 122/60 | HR 81 | Temp 97.9°F | Wt 168.8 lb

## 2017-10-25 DIAGNOSIS — E538 Deficiency of other specified B group vitamins: Secondary | ICD-10-CM

## 2017-10-25 DIAGNOSIS — Z0001 Encounter for general adult medical examination with abnormal findings: Secondary | ICD-10-CM | POA: Diagnosis not present

## 2017-10-25 DIAGNOSIS — I10 Essential (primary) hypertension: Secondary | ICD-10-CM | POA: Diagnosis not present

## 2017-10-25 DIAGNOSIS — N6459 Other signs and symptoms in breast: Secondary | ICD-10-CM | POA: Diagnosis not present

## 2017-10-25 DIAGNOSIS — E663 Overweight: Secondary | ICD-10-CM

## 2017-10-25 DIAGNOSIS — E785 Hyperlipidemia, unspecified: Secondary | ICD-10-CM | POA: Diagnosis not present

## 2017-10-25 MED ORDER — CYANOCOBALAMIN 1000 MCG/ML IJ SOLN: 1000.0000 ug | Freq: Once | INTRAMUSCULAR | Status: AC

## 2017-10-25 NOTE — Progress Notes (Signed)
Patient ID: Kara Harvey, female    DOB: 1941/04/22  Age: 77 y.o. MRN: [id number]  The patient is here for annual preventive examination and management of other chronic and acute problems.   The risk factors are reflected in the social history.  The roster of all physicians providing medical care to patient - is listed in the Snapshot section of the chart.  Activities of daily living:  The patient is 100% independent in all ADLs: dressing, toileting, feeding as well as independent mobility  Home safety : The patient has smoke detectors in the home. They wear seatbelts.  There are no firearms at home. There is no violence in the home.   There is no risks for hepatitis, STDs or HIV. There is no   history of blood transfusion. They have no travel history to infectious disease endemic areas of the world.  The patient has seen their dentist in the last six month. She has seen her eye doctor in the last year. She denies hearing difficulty with regard to whispered voices and some television programs.  They have deferred audiologic testing in the last year.  They do not  have excessive sun exposure. Discussed the need for sun protection: hats, long sleeves and use of sunscreen if there is significant sun exposure.   Diet: the importance of a healthy diet is discussed. She has a healthy diet.  The benefits of regular aerobic exercise were discussed. She walks 4 times per week ,  20 minutes.   Depression screen: there are no signs or vegative symptoms of depression- irritability, change in appetite, anhedonia, sadness/tearfullness.  Cognitive assessment: the patient manages all their financial and personal affairs and is actively engaged. They could relate day,date,year and events; recalled 2/3 objects at 3 minutes; performed clock-face test normally.  The following portions of the patient's history were reviewed and updated as appropriate: allergies, current medications, past family history, past  medical history,  past surgical history, past social history  and problem list.  Visual acuity was not assessed per patient preference since she has regular follow up with her ophthalmologist. Hearing and body mass index were assessed and reviewed.   During the course of the visit the patient was educated and counseled about appropriate screening and preventive services including : fall prevention , diabetes screening, nutrition counseling, colorectal cancer screening, and recommended immunizations.    CC: The primary encounter diagnosis was B12 deficiency. Diagnoses of Abnormal breast exam, Essential hypertension, benign, Hyperlipidemia LDL goal <130, Overweight (BMI 25.0-29.9), and Encounter for general adult medical examination with abnormal findings were also pertinent to this visit.  History Alie has a past medical history of Hyperlipidemia and Hypertension.   She has a past surgical history that includes Fracture surgery (Left, 01/14/2011); Tonsilectomy, adenoidectomy, bilateral myringotomy and tubes (Bilateral, 1957); Tubal ligation (y-37); Breast surgery (Left, 1986); Eye surgery (Left, 6wks ago); and Breast excisional biopsy (Left, 1988).   Her family history includes Alzheimer's disease in her father; Arthritis in her father; Cancer in her mother; Colon cancer in her paternal aunt; Hyperlipidemia in her mother.She reports that she quit smoking about 38 years ago. Her smoking use included cigarettes. She smoked 2.00 packs per day. She has never used smokeless tobacco. She reports that she drinks about 3.0 oz of alcohol per week. She reports that she does not use drugs.  Outpatient Medications Prior to Visit  Medication Sig Dispense Refill  . atorvastatin (LIPITOR) 20 MG tablet take 1 tablet by mouth once daily  90 tablet 1  . buPROPion (WELLBUTRIN SR) 150 MG 12 hr tablet take 1 tablet by mouth twice a day 180 tablet 3  . calcium carbonate (TUMS - DOSED IN MG ELEMENTAL CALCIUM) 500 MG  chewable tablet Chew 1 tablet (200 mg of elemental calcium total) by mouth 3 (three) times daily as needed for indigestion or heartburn. 30 tablet 0  . Cholecalciferol (VITAMIN D3) 1000 UNITS CAPS Take 1,000 Units by mouth daily.    . famotidine (PEPCID) 20 MG tablet Take 20 mg by mouth 2 (two) times daily.    Marland Kitchen lisinopril-hydrochlorothiazide (PRINZIDE,ZESTORETIC) 10-12.5 MG tablet take 1 tablet by mouth once daily 90 tablet 1  . valACYclovir (VALTREX) 1000 MG tablet Take 100 mg by mouth as needed.    . diphenhydrAMINE (BENADRYL) 2 % cream Apply 1 application topically 3 (three) times daily as needed for itching. Reported on 01/23/2016    . docusate sodium (COLACE) 100 MG capsule Take 1 capsule (100 mg total) by mouth daily as needed. (Patient not taking: Reported on 10/25/2017) 30 capsule 2  . ondansetron (ZOFRAN) 4 MG tablet Take 1 tablet (4 mg total) by mouth every 8 (eight) hours as needed for nausea or vomiting. (Patient not taking: Reported on 10/25/2017) 8 tablet 0  . oxyCODONE-acetaminophen (ROXICET) 5-325 MG tablet Take 1 tablet by mouth every 6 (six) hours as needed. (Patient not taking: Reported on 10/25/2017) 30 tablet 0   No facility-administered medications prior to visit.     Review of Systems   Patient denies headache, fevers, malaise, unintentional weight loss, skin rash, eye pain, sinus congestion and sinus pain, sore throat, dysphagia,  hemoptysis , cough, dyspnea, wheezing, chest pain, palpitations, orthopnea, edema, abdominal pain, nausea, melena, diarrhea, constipation, flank pain, dysuria, hematuria, urinary  Frequency, nocturia, numbness, tingling, seizures,  Focal weakness, Loss of consciousness,  Tremor, insomnia, depression, anxiety, and suicidal ideation.      Objective:  BP 122/60 (BP Location: Right Arm, Patient Position: Sitting, Cuff Size: Normal)   Pulse 81   Temp 97.9 F (36.6 C)   Wt 168 lb 12.8 oz (76.6 kg)   SpO2 96%   BMI 29.90 kg/m   Physical Exam    General appearance: alert, cooperative and appears stated age Head: Normocephalic, without obvious abnormality, atraumatic Eyes: conjunctivae/corneas clear. PERRL, EOM's intact. Fundi benign. Ears: normal TM's and external ear canals both ears Nose: Nares normal. Septum midline. Mucosa normal. No drainage or sinus tenderness. Throat: lips, mucosa, and tongue normal; teeth and gums normal Neck: no adenopathy, no carotid bruit, no JVD, supple, symmetrical, trachea midline and thyroid not enlarged, symmetric, no tenderness/mass/nodules Lungs: clear to auscultation bilaterally Breasts: normal right breast.  Left breast abnormal: possible mass at 12:00 position 1 cm above nipple Heart: regular rate and rhythm, S1, S2 normal, no murmur, click, rub or gallop Abdomen: soft, non-tender; bowel sounds normal; no masses,  no organomegaly Extremities: extremities normal, atraumatic, no cyanosis or edema Pulses: 2+ and symmetric Skin: Skin color, texture, turgor normal. No rashes or lesions Neurologic: Alert and oriented X 3, normal strength and tone. Normal symmetric reflexes. Normal coordination and gait.      Assessment & Plan:   Problem List Items Addressed This Visit    Overweight (BMI 25.0-29.9)    She has lost 14 lbs since April 2018. I have  encouraged  Continued weight loss with goal of 10% of body weigh over the next 6 months using a low glycemic index diet and regular exercise a minimum  of 5 days per week.        Hyperlipidemia LDL goal <130    Managed with atorvastatin .  LFTs are normal,  No changes today   Lab Results  Component Value Date   CHOL 158 10/23/2017   HDL 84.20 10/23/2017   LDLCALC 66 10/23/2017   LDLDIRECT 135.1 09/03/2013   TRIG 43.0 10/23/2017   CHOLHDL 2 10/23/2017   Lab Results  Component Value Date   ALT 16 10/23/2017   AST 17 10/23/2017   ALKPHOS 72 10/23/2017   BILITOT 0.5 10/23/2017         Essential hypertension, benign    Well controlled on  current regimen. Renal function stable, no changes today.  Lab Results  Component Value Date   CREATININE 0.83 10/23/2017   Lab Results  Component Value Date   NA 134 (L) 10/23/2017   K 4.0 10/23/2017   CL 97 10/23/2017   CO2 27 10/23/2017         Encounter for general adult medical examination with abnormal findings    Annual comprehensive preventive exam was done which was notabel for an abnormal breast exam on the left.  Diagnostic mammogram ordered.  Additionally, 6 month  Follow up on hypertension and hyperlipidemia was done .  During the course of the visit the patient was educated and counseled about appropriate screening and preventive services including :  diabetes screening, lipid analysis with projected  10 year  risk for CAD , nutrition counseling, breast, cervical and colorectal cancer screening, and recommended immunizations.  Printed recommendations for health maintenance screenings was given.       Abnormal breast exam    Diagnostic mammogram ordered of left breast       Relevant Orders   MM Digital Diagnostic Unilat L   US BREAST COMPLETE UNI LEFT INC AXILLA   MM Digital Diagnostic Bilat   US BREAST LTD UNI RIGHT INC AXILLA   US BREAST LTD UNI LEFT INC AXILLA   MM DIAG BREAST TOMO UNI LEFT    Other Visit Diagnoses    B12 deficiency    -  Primary   Relevant Medications   cyanocobalamin ((VITAMIN B-12)) injection 1,000 mcg      I am having Kara Harvey maintain her Vitamin D3, diphenhydrAMINE, buPROPion, atorvastatin, lisinopril-hydrochlorothiazide, docusate sodium, ondansetron, calcium carbonate, famotidine, oxyCODONE-acetaminophen, and valACYclovir. We will continue to administer cyanocobalamin.  Meds ordered this encounter  Medications  . cyanocobalamin ((VITAMIN B-12)) injection 1,000 mcg    There are no discontinued medications.  Follow-up: No follow-ups on file.   Crecencio Mc, MD

## 2017-10-25 NOTE — Patient Instructions (Addendum)
Your labs are all fine   I have ordered a diagnostic mammogram because of your exam today on your left breast.     Health Maintenance for Postmenopausal Women Menopause is a normal process in which your reproductive ability comes to an end. This process happens gradually over a span of months to years, usually between the ages of 70 and 108. Menopause is complete when you have missed 12 consecutive menstrual periods. It is important to talk with your health care provider about some of the most common conditions that affect postmenopausal women, such as heart disease, cancer, and bone loss (osteoporosis). Adopting a healthy lifestyle and getting preventive care can help to promote your health and wellness. Those actions can also lower your chances of developing some of these common conditions. What should I know about menopause? During menopause, you may experience a number of symptoms, such as:  Moderate-to-severe hot flashes.  Night sweats.  Decrease in sex drive.  Mood swings.  Headaches.  Tiredness.  Irritability.  Memory problems.  Insomnia.  Choosing to treat or not to treat menopausal changes is an individual decision that you make with your health care provider. What should I know about hormone replacement therapy and supplements? Hormone therapy products are effective for treating symptoms that are associated with menopause, such as hot flashes and night sweats. Hormone replacement carries certain risks, especially as you become older. If you are thinking about using estrogen or estrogen with progestin treatments, discuss the benefits and risks with your health care provider. What should I know about heart disease and stroke? Heart disease, heart attack, and stroke become more likely as you age. This may be due, in part, to the hormonal changes that your body experiences during menopause. These can affect how your body processes dietary fats, triglycerides, and cholesterol.  Heart attack and stroke are both medical emergencies. There are many things that you can do to help prevent heart disease and stroke:  Have your blood pressure checked at least every 1-2 years. High blood pressure causes heart disease and increases the risk of stroke.  If you are 53-97 years old, ask your health care provider if you should take aspirin to prevent a heart attack or a stroke.  Do not use any tobacco products, including cigarettes, chewing tobacco, or electronic cigarettes. If you need help quitting, ask your health care provider.  It is important to eat a healthy diet and maintain a healthy weight. ? Be sure to include plenty of vegetables, fruits, low-fat dairy products, and lean protein. ? Avoid eating foods that are high in solid fats, added sugars, or salt (sodium).  Get regular exercise. This is one of the most important things that you can do for your health. ? Try to exercise for at least 150 minutes each week. The type of exercise that you do should increase your heart rate and make you sweat. This is known as moderate-intensity exercise. ? Try to do strengthening exercises at least twice each week. Do these in addition to the moderate-intensity exercise.  Know your numbers.Ask your health care provider to check your cholesterol and your blood glucose. Continue to have your blood tested as directed by your health care provider.  What should I know about cancer screening? There are several types of cancer. Take the following steps to reduce your risk and to catch any cancer development as early as possible. Breast Cancer  Practice breast self-awareness. ? This means understanding how your breasts normally appear and feel. ?  It also means doing regular breast self-exams. Let your health care provider know about any changes, no matter how small.  If you are 79 or older, have a clinician do a breast exam (clinical breast exam or CBE) every year. Depending on your age,  family history, and medical history, it may be recommended that you also have a yearly breast X-ray (mammogram).  If you have a family history of breast cancer, talk with your health care provider about genetic screening.  If you are at high risk for breast cancer, talk with your health care provider about having an MRI and a mammogram every year.  Breast cancer (BRCA) gene test is recommended for women who have family members with BRCA-related cancers. Results of the assessment will determine the need for genetic counseling and BRCA1 and for BRCA2 testing. BRCA-related cancers include these types: ? Breast. This occurs in males or females. ? Ovarian. ? Tubal. This may also be called fallopian tube cancer. ? Cancer of the abdominal or pelvic lining (peritoneal cancer). ? Prostate. ? Pancreatic.  Cervical, Uterine, and Ovarian Cancer Your health care provider may recommend that you be screened regularly for cancer of the pelvic organs. These include your ovaries, uterus, and vagina. This screening involves a pelvic exam, which includes checking for microscopic changes to the surface of your cervix (Pap test).  For women ages 21-65, health care providers may recommend a pelvic exam and a Pap test every three years. For women ages 59-65, they may recommend the Pap test and pelvic exam, combined with testing for human papilloma virus (HPV), every five years. Some types of HPV increase your risk of cervical cancer. Testing for HPV may also be done on women of any age who have unclear Pap test results.  Other health care providers may not recommend any screening for nonpregnant women who are considered low risk for pelvic cancer and have no symptoms. Ask your health care provider if a screening pelvic exam is right for you.  If you have had past treatment for cervical cancer or a condition that could lead to cancer, you need Pap tests and screening for cancer for at least 20 years after your  treatment. If Pap tests have been discontinued for you, your risk factors (such as having a new sexual partner) need to be reassessed to determine if you should start having screenings again. Some women have medical problems that increase the chance of getting cervical cancer. In these cases, your health care provider may recommend that you have screening and Pap tests more often.  If you have a family history of uterine cancer or ovarian cancer, talk with your health care provider about genetic screening.  If you have vaginal bleeding after reaching menopause, tell your health care provider.  There are currently no reliable tests available to screen for ovarian cancer.  Lung Cancer Lung cancer screening is recommended for adults 69-86 years old who are at high risk for lung cancer because of a history of smoking. A yearly low-dose CT scan of the lungs is recommended if you:  Currently smoke.  Have a history of at least 30 pack-years of smoking and you currently smoke or have quit within the past 15 years. A pack-year is smoking an average of one pack of cigarettes per day for one year.  Yearly screening should:  Continue until it has been 15 years since you quit.  Stop if you develop a health problem that would prevent you from having lung cancer  treatment.  Colorectal Cancer  This type of cancer can be detected and can often be prevented.  Routine colorectal cancer screening usually begins at age 45 and continues through age 55.  If you have risk factors for colon cancer, your health care provider may recommend that you be screened at an earlier age.  If you have a family history of colorectal cancer, talk with your health care provider about genetic screening.  Your health care provider may also recommend using home test kits to check for hidden blood in your stool.  A small camera at the end of a tube can be used to examine your colon directly (sigmoidoscopy or colonoscopy). This  is done to check for the earliest forms of colorectal cancer.  Direct examination of the colon should be repeated every 5-10 years until age 39. However, if early forms of precancerous polyps or small growths are found or if you have a family history or genetic risk for colorectal cancer, you may need to be screened more often.  Skin Cancer  Check your skin from head to toe regularly.  Monitor any moles. Be sure to tell your health care provider: ? About any new moles or changes in moles, especially if there is a change in a mole's shape or color. ? If you have a mole that is larger than the size of a pencil eraser.  If any of your family members has a history of skin cancer, especially at a young age, talk with your health care provider about genetic screening.  Always use sunscreen. Apply sunscreen liberally and repeatedly throughout the day.  Whenever you are outside, protect yourself by wearing long sleeves, pants, a wide-brimmed hat, and sunglasses.  What should I know about osteoporosis? Osteoporosis is a condition in which bone destruction happens more quickly than new bone creation. After menopause, you may be at an increased risk for osteoporosis. To help prevent osteoporosis or the bone fractures that can happen because of osteoporosis, the following is recommended:  If you are 36-73 years old, get at least 1,000 mg of calcium and at least 600 mg of vitamin D per day.  If you are older than age 77 but younger than age 54, get at least 1,200 mg of calcium and at least 600 mg of vitamin D per day.  If you are older than age 4, get at least 1,200 mg of calcium and at least 800 mg of vitamin D per day.  Smoking and excessive alcohol intake increase the risk of osteoporosis. Eat foods that are rich in calcium and vitamin D, and do weight-bearing exercises several times each week as directed by your health care provider. What should I know about how menopause affects my mental  health? Depression may occur at any age, but it is more common as you become older. Common symptoms of depression include:  Low or sad mood.  Changes in sleep patterns.  Changes in appetite or eating patterns.  Feeling an overall lack of motivation or enjoyment of activities that you previously enjoyed.  Frequent crying spells.  Talk with your health care provider if you think that you are experiencing depression. What should I know about immunizations? It is important that you get and maintain your immunizations. These include:  Tetanus, diphtheria, and pertussis (Tdap) booster vaccine.  Influenza every year before the flu season begins.  Pneumonia vaccine.  Shingles vaccine.  Your health care provider may also recommend other immunizations. This information is not intended to replace  advice given to you by your health care provider. Make sure you discuss any questions you have with your health care provider. Document Released: 08/24/2005 Document Revised: 01/20/2016 Document Reviewed: 04/05/2015 Elsevier Interactive Patient Education  2018 [employer].

## 2017-10-27 DIAGNOSIS — N6459 Other signs and symptoms in breast: Secondary | ICD-10-CM | POA: Insufficient documentation

## 2017-10-27 NOTE — Assessment & Plan Note (Signed)
She has lost 14 lbs since April 2018. I have  encouraged  Continued weight loss with goal of 10% of body weigh over the next 6 months using a low glycemic index diet and regular exercise a minimum of 5 days per week.

## 2017-10-27 NOTE — Assessment & Plan Note (Signed)
Well controlled on current regimen. Renal function stable, no changes today.  Lab Results  Component Value Date   CREATININE 0.83 10/23/2017   Lab Results  Component Value Date   NA 134 (L) 10/23/2017   K 4.0 10/23/2017   CL 97 10/23/2017   CO2 27 10/23/2017

## 2017-10-27 NOTE — Assessment & Plan Note (Signed)
Annual comprehensive preventive exam was done which was notabel for an abnormal breast exam on the left.  Diagnostic mammogram ordered.  Additionally, 6 month  Follow up on hypertension and hyperlipidemia was done .  During the course of the visit the patient was educated and counseled about appropriate screening and preventive services including :  diabetes screening, lipid analysis with projected  10 year  risk for CAD , nutrition counseling, breast, cervical and colorectal cancer screening, and recommended immunizations.  Printed recommendations for health maintenance screenings was given.

## 2017-10-27 NOTE — Assessment & Plan Note (Signed)
Diagnostic mammogram ordered of left breast

## 2017-10-27 NOTE — Assessment & Plan Note (Signed)
Managed with atorvastatin .  LFTs are normal,  No changes today   Lab Results  Component Value Date   CHOL 158 10/23/2017   HDL 84.20 10/23/2017   LDLCALC 66 10/23/2017   LDLDIRECT 135.1 09/03/2013   TRIG 43.0 10/23/2017   CHOLHDL 2 10/23/2017   Lab Results  Component Value Date   ALT 16 10/23/2017   AST 17 10/23/2017   ALKPHOS 72 10/23/2017   BILITOT 0.5 10/23/2017

## 2017-10-30 ENCOUNTER — Ambulatory Visit
Admission: RE | Admit: 2017-10-30 | Discharge: 2017-10-30 | Disposition: A | Discharge status: Home / Self Care | Payer: Medicare Other | Source: Ambulatory Visit | Attending: Internal Medicine | Admitting: Internal Medicine

## 2017-10-30 DIAGNOSIS — N6489 Other specified disorders of breast: Secondary | ICD-10-CM | POA: Diagnosis not present

## 2017-10-30 DIAGNOSIS — N6459 Other signs and symptoms in breast: Secondary | ICD-10-CM

## 2017-10-30 DIAGNOSIS — R922 Inconclusive mammogram: Secondary | ICD-10-CM | POA: Diagnosis not present

## 2017-11-07 ENCOUNTER — Other Ambulatory Visit: Payer: Self-pay | Admitting: Internal Medicine

## 2017-11-07 DIAGNOSIS — Z1231 Encounter for screening mammogram for malignant neoplasm of breast: Secondary | ICD-10-CM

## 2017-11-08 ENCOUNTER — Other Ambulatory Visit: Payer: Self-pay | Admitting: Internal Medicine

## 2018-01-03 ENCOUNTER — Ambulatory Visit: Payer: Self-pay

## 2018-01-24 ENCOUNTER — Ambulatory Visit
Admission: RE | Admit: 2018-01-24 | Discharge: 2018-01-24 | Disposition: A | Discharge status: Home / Self Care | Payer: Medicare Other | Source: Ambulatory Visit | Attending: Internal Medicine | Admitting: Internal Medicine

## 2018-01-24 DIAGNOSIS — Z1231 Encounter for screening mammogram for malignant neoplasm of breast: Secondary | ICD-10-CM | POA: Diagnosis not present

## 2018-02-03 ENCOUNTER — Other Ambulatory Visit: Payer: Self-pay | Admitting: Internal Medicine

## 2018-04-28 ENCOUNTER — Encounter: Payer: Self-pay | Admitting: Internal Medicine

## 2018-04-28 ENCOUNTER — Ambulatory Visit: Payer: Medicare Other | Admitting: Internal Medicine

## 2018-04-28 VITALS — BP 110/74 | HR 77 | Temp 98.0°F | Resp 16 | Ht 63.0 in | Wt 183.0 lb

## 2018-04-28 DIAGNOSIS — F32A Depression, unspecified: Secondary | ICD-10-CM

## 2018-04-28 DIAGNOSIS — Z79899 Other long term (current) drug therapy: Secondary | ICD-10-CM | POA: Diagnosis not present

## 2018-04-28 DIAGNOSIS — Z0001 Encounter for general adult medical examination with abnormal findings: Secondary | ICD-10-CM | POA: Diagnosis not present

## 2018-04-28 DIAGNOSIS — R635 Abnormal weight gain: Secondary | ICD-10-CM

## 2018-04-28 DIAGNOSIS — F329 Major depressive disorder, single episode, unspecified: Secondary | ICD-10-CM

## 2018-04-28 DIAGNOSIS — E785 Hyperlipidemia, unspecified: Secondary | ICD-10-CM

## 2018-04-28 DIAGNOSIS — I10 Essential (primary) hypertension: Secondary | ICD-10-CM

## 2018-04-28 LAB — TSH: TSH: 3 u[IU]/mL (ref 0.35–4.50)

## 2018-04-28 LAB — COMPREHENSIVE METABOLIC PANEL
ALT: 18 U/L (ref 0–35)
AST: 17 U/L (ref 0–37)
Albumin: 4.2 g/dL (ref 3.5–5.2)
Alkaline Phosphatase: 71 U/L (ref 39–117)
BILIRUBIN TOTAL: 0.5 mg/dL (ref 0.2–1.2)
BUN: 23 mg/dL (ref 6–23)
CHLORIDE: 95 meq/L — AB (ref 96–112)
CO2: 30 meq/L (ref 19–32)
CREATININE: 0.78 mg/dL (ref 0.40–1.20)
Calcium: 9.5 mg/dL (ref 8.4–10.5)
GFR: 76.36 mL/min (ref >60.00)
GLUCOSE: 95 mg/dL (ref 70–99)
Potassium: 4.1 mEq/L (ref 3.5–5.1)
SODIUM: 131 meq/L — AB (ref 135–145)
Total Protein: 7 g/dL (ref 6.0–8.3)

## 2018-04-28 NOTE — Patient Instructions (Addendum)
I recommend joining a gym and using a trainer if you need a PUSH . Linton Flemings  And Konrad Dolores are the best trainers at [employer]!!!!!   And take Richard with you  Keep health crunchy snacks on hand (celery with blue cheese,  Steamed veggies tossed with salad dressing   Choose ice cream on a stick  !  ([employer] carb smart bars)    You are due for 5 yr follow up colonoscopy.  Cologuard may not be  an alternative because of  the polyp that was found ;  I will ask Dr Hilarie Fredrickson for his opinion

## 2018-04-28 NOTE — Progress Notes (Addendum)
Subjective:  Patient ID: Kara Harvey, female    DOB: July 24, 1940  Age: 77 y.o. MRN: [id number]  CC: The primary encounter diagnosis was Long-term use of high-risk medication. Diagnoses of Weight gain, Depressive disorder, Encounter for general adult medical examination with abnormal findings, Hyperlipidemia LDL goal <130, and Essential hypertension, benign were also pertinent to this visit.  HPI Kara Harvey presents for follow up on hypertension , hyperlipidemia and overweight  Has gained  15 lbs since last visit.  Diet reviewed,  Tried to follow a low carb diet ,  But cheats on  crackers,  And Not exercising due to husband's increased needs since his CVA which left him considerable weak  DOES NOT WANT 5 YR FOLLOW UP COLONOSCOPY due to the ordeal of last one 5 years ago.  Report reviewed,,  She had a sessile polyp resected but it was not sent to pathology.   Outpatient Medications Prior to Visit  Medication Sig Dispense Refill  . atorvastatin (LIPITOR) 20 MG tablet TAKE 1 TABLET BY MOUTH ONCE DAILY 90 tablet 1  . buPROPion (WELLBUTRIN SR) 150 MG 12 hr tablet take 1 tablet by mouth twice a day 180 tablet 3  . calcium carbonate (TUMS - DOSED IN MG ELEMENTAL CALCIUM) 500 MG chewable tablet Chew 1 tablet (200 mg of elemental calcium total) by mouth 3 (three) times daily as needed for indigestion or heartburn. 30 tablet 0  . Cholecalciferol (VITAMIN D3) 1000 UNITS CAPS Take 1,000 Units by mouth daily.    . famotidine (PEPCID) 20 MG tablet Take 20 mg by mouth 2 (two) times daily.    . valACYclovir (VALTREX) 1000 MG tablet TAKE 2 TABLETS BY MOUTH TWICE A DAY 20 tablet 0  . lisinopril-hydrochlorothiazide (PRINZIDE,ZESTORETIC) 10-12.5 MG tablet TAKE 1 TABLET BY MOUTH ONCE DAILY 90 tablet 1  . diphenhydrAMINE (BENADRYL) 2 % cream Apply 1 application topically 3 (three) times daily as needed for itching. Reported on 01/23/2016    . docusate sodium (COLACE) 100 MG capsule Take 1 capsule (100 mg  total) by mouth daily as needed. (Patient not taking: Reported on 10/25/2017) 30 capsule 2  . ondansetron (ZOFRAN) 4 MG tablet Take 1 tablet (4 mg total) by mouth every 8 (eight) hours as needed for nausea or vomiting. (Patient not taking: Reported on 10/25/2017) 8 tablet 0  . oxyCODONE-acetaminophen (ROXICET) 5-325 MG tablet Take 1 tablet by mouth every 6 (six) hours as needed. (Patient not taking: Reported on 10/25/2017) 30 tablet 0   Facility-Administered Medications Prior to Visit  Medication Dose Route Frequency Provider Last Rate Last Dose  . cyanocobalamin ((VITAMIN B-12)) injection 1,000 mcg  1,000 mcg Intramuscular Once Crecencio Mc, MD        Review of Systems;  Patient denies headache, fevers, malaise, unintentional weight loss, skin rash, eye pain, sinus congestion and sinus pain, sore throat, dysphagia,  hemoptysis , cough, dyspnea, wheezing, chest pain, palpitations, orthopnea, edema, abdominal pain, nausea, melena, diarrhea, constipation, flank pain, dysuria, hematuria, urinary  Frequency, nocturia, numbness, tingling, seizures,  Focal weakness, Loss of consciousness,  Tremor, insomnia, depression, anxiety, and suicidal ideation.      Objective:  BP 110/74 (BP Location: Left Arm, Patient Position: Sitting, Cuff Size: Normal)   Pulse 77   Temp 98 F (36.7 C) (Oral)   Resp 16   Ht 5\' 3"  (1.6 m)   Wt 183 lb (83 kg)   SpO2 97%   BMI 32.42 kg/m   BP Readings from Last 3  Encounters:  04/28/18 110/74  10/25/17 122/60  07/15/17 126/65    Wt Readings from Last 3 Encounters:  04/28/18 183 lb (83 kg)  10/25/17 168 lb 12.8 oz (76.6 kg)  07/14/17 177 lb 8 oz (80.5 kg)    General appearance: alert, cooperative and appears stated age Ears: normal TM's and external ear canals both ears Throat: lips, mucosa, and tongue normal; teeth and gums normal Neck: no adenopathy, no carotid bruit, supple, symmetrical, trachea midline and thyroid not enlarged, symmetric, no  tenderness/mass/nodules Back: symmetric, no curvature. ROM normal. No CVA tenderness. Lungs: clear to auscultation bilaterally Heart: regular rate and rhythm, S1, S2 normal, no murmur, click, rub or gallop Abdomen: soft, non-tender; bowel sounds normal; no masses,  no organomegaly Pulses: 2+ and symmetric Skin: Skin color, texture, turgor normal. No rashes or lesions Lymph nodes: Cervical, supraclavicular, and axillary nodes normal.  No results found for: HGBA1C  Lab Results  Component Value Date   CREATININE 0.78 04/28/2018   CREATININE 0.83 10/23/2017   CREATININE 0.71 07/14/2017    Lab Results  Component Value Date   WBC 7.8 10/23/2017   HGB 13.2 10/23/2017   HCT 39.3 10/23/2017   PLT 333.0 10/23/2017   GLUCOSE 95 04/28/2018   CHOL 158 10/23/2017   TRIG 43.0 10/23/2017   HDL 84.20 10/23/2017   LDLDIRECT 135.1 09/03/2013   LDLCALC 66 10/23/2017   ALT 18 04/28/2018   AST 17 04/28/2018   NA 131 (L) 04/28/2018   K 4.1 04/28/2018   CL 95 (L) 04/28/2018   CREATININE 0.78 04/28/2018   BUN 23 04/28/2018   CO2 30 04/28/2018   TSH 3.00 04/28/2018   MICROALBUR 0.1 09/03/2013    Mm 3d Screen Breast Bilateral  Result Date: 01/24/2018 CLINICAL DATA:  Screening. EXAM: DIGITAL SCREENING BILATERAL MAMMOGRAM WITH TOMO AND CAD COMPARISON:  Previous exam(s). ACR Breast Density Category c: The breast tissue is heterogeneously dense, which may obscure small masses. FINDINGS: There are no findings suspicious for malignancy. Images were processed with CAD. IMPRESSION: No mammographic evidence of malignancy. A result letter of this screening mammogram will be mailed directly to the patient. RECOMMENDATION: Screening mammogram in one year. (Code:SM-B-01Y) BI-RADS CATEGORY  1: Negative. Electronically Signed   By: Curlene Dolphin M.D.   On: 01/24/2018 16:36    Assessment & Plan:   Problem List Items Addressed This Visit    Depressive disorder    She notes continued need for wellbutrin.  Refills given.       Essential hypertension, benign    Changing med to lisinopril due to hyponatremia with hctz       Hyperlipidemia LDL goal <130    Managed with atorvastatin .  LFTs are due   No changes today        Long-term use of high-risk medication - Primary   Relevant Orders   Comprehensive metabolic panel (Completed)    Other Visit Diagnoses    Weight gain       Relevant Orders   TSH (Completed)   Encounter for general adult medical examination with abnormal findings          I have discontinued Eartha S. Stay's diphenhydrAMINE, docusate sodium, ondansetron, and oxyCODONE-acetaminophen. I am also having her maintain her Vitamin D3, buPROPion, calcium carbonate, famotidine, valACYclovir, and atorvastatin. We will continue to administer cyanocobalamin.  No orders of the defined types were placed in this encounter.   Medications Discontinued During This Encounter  Medication Reason  . diphenhydrAMINE (BENADRYL) 2 %  cream Patient has not taken in last 30 days  . docusate sodium (COLACE) 100 MG capsule Patient has not taken in last 30 days  . ondansetron (ZOFRAN) 4 MG tablet Patient has not taken in last 30 days  . oxyCODONE-acetaminophen (ROXICET) 5-325 MG tablet Patient has not taken in last 30 days    Follow-up: Return in about 6 months (around 10/28/2018) for CPE.   Crecencio Mc, MD

## 2018-04-28 NOTE — Assessment & Plan Note (Signed)
Managed with atorvastatin .  LFTs are due   No changes today

## 2018-04-28 NOTE — Assessment & Plan Note (Signed)
She notes continued need for wellbutrin. Refills given.  

## 2018-04-28 NOTE — Assessment & Plan Note (Signed)

## 2018-04-29 ENCOUNTER — Other Ambulatory Visit: Payer: Self-pay | Admitting: Internal Medicine

## 2018-04-29 MED ORDER — LISINOPRIL 20 MG PO TABS: 20.0000 mg | ORAL_TABLET | Freq: Every day | ORAL | 3 refills | Status: DC

## 2018-04-29 NOTE — Assessment & Plan Note (Signed)
Changing med to lisinopril due to hyponatremia with hctz

## 2018-04-30 ENCOUNTER — Encounter: Payer: Self-pay | Admitting: *Deleted

## 2018-07-22 ENCOUNTER — Other Ambulatory Visit: Payer: Self-pay

## 2018-07-22 MED ORDER — BUPROPION HCL ER (SR) 150 MG PO TB12: 150.0000 mg | ORAL_TABLET | Freq: Two times a day (BID) | ORAL | 1 refills | Status: DC

## 2018-08-13 ENCOUNTER — Other Ambulatory Visit: Payer: Self-pay | Admitting: Internal Medicine

## 2018-08-25 ENCOUNTER — Encounter: Payer: Self-pay | Admitting: Internal Medicine

## 2018-09-26 ENCOUNTER — Other Ambulatory Visit: Payer: Self-pay | Admitting: Internal Medicine

## 2018-10-22 ENCOUNTER — Telehealth: Payer: Self-pay | Admitting: Internal Medicine

## 2018-10-22 DIAGNOSIS — E785 Hyperlipidemia, unspecified: Secondary | ICD-10-CM

## 2018-10-22 DIAGNOSIS — I10 Essential (primary) hypertension: Secondary | ICD-10-CM

## 2018-10-22 NOTE — Telephone Encounter (Signed)
Perfect, Thanks!!

## 2018-10-22 NOTE — Telephone Encounter (Signed)
Pt called and wanted to have fasting labs done before appt. Pt is scheduled for 04/09 with her husband. Need orders please and thank you!

## 2018-10-22 NOTE — Telephone Encounter (Signed)
I have ordered CMP, and lipid panel. Is there anything else that needs to be ordered?

## 2018-10-23 ENCOUNTER — Other Ambulatory Visit (INDEPENDENT_AMBULATORY_CARE_PROVIDER_SITE_OTHER): Payer: Medicare Other

## 2018-10-23 ENCOUNTER — Other Ambulatory Visit: Payer: Self-pay

## 2018-10-23 DIAGNOSIS — E785 Hyperlipidemia, unspecified: Secondary | ICD-10-CM | POA: Diagnosis not present

## 2018-10-23 DIAGNOSIS — I10 Essential (primary) hypertension: Secondary | ICD-10-CM

## 2018-10-23 LAB — LIPID PANEL
Cholesterol: 190 mg/dL (ref 0–200)
HDL: 80.1 mg/dL (ref >39.00)
LDL Cholesterol: 95 mg/dL (ref 0–99)
NonHDL: 110.26
Total CHOL/HDL Ratio: 2
Triglycerides: 78 mg/dL (ref 0.0–149.0)
VLDL: 15.6 mg/dL (ref 0.0–40.0)

## 2018-10-23 LAB — COMPREHENSIVE METABOLIC PANEL
ALT: 13 U/L (ref 0–35)
AST: 14 U/L (ref 0–37)
Albumin: 4.4 g/dL (ref 3.5–5.2)
Alkaline Phosphatase: 71 U/L (ref 39–117)
BUN: 17 mg/dL (ref 6–23)
CO2: 27 mEq/L (ref 19–32)
Calcium: 9.6 mg/dL (ref 8.4–10.5)
Chloride: 100 mEq/L (ref 96–112)
Creatinine, Ser: 0.76 mg/dL (ref 0.40–1.20)
GFR: 73.94 mL/min (ref >60.00)
Glucose, Bld: 92 mg/dL (ref 70–99)
Potassium: 4.2 mEq/L (ref 3.5–5.1)
Sodium: 137 mEq/L (ref 135–145)
Total Bilirubin: 0.6 mg/dL (ref 0.2–1.2)
Total Protein: 6.7 g/dL (ref 6.0–8.3)

## 2018-10-27 ENCOUNTER — Ambulatory Visit: Payer: Self-pay | Admitting: Internal Medicine

## 2018-10-29 ENCOUNTER — Ambulatory Visit (INDEPENDENT_AMBULATORY_CARE_PROVIDER_SITE_OTHER): Payer: Medicare Other | Admitting: Internal Medicine

## 2018-10-29 ENCOUNTER — Encounter: Payer: Self-pay | Admitting: Internal Medicine

## 2018-10-29 VITALS — BP 119/74 | HR 81 | Temp 97.2°F

## 2018-10-29 DIAGNOSIS — Z1211 Encounter for screening for malignant neoplasm of colon: Secondary | ICD-10-CM | POA: Diagnosis not present

## 2018-10-29 DIAGNOSIS — Z1239 Encounter for other screening for malignant neoplasm of breast: Secondary | ICD-10-CM | POA: Diagnosis not present

## 2018-10-29 DIAGNOSIS — E785 Hyperlipidemia, unspecified: Secondary | ICD-10-CM

## 2018-10-29 DIAGNOSIS — I10 Essential (primary) hypertension: Secondary | ICD-10-CM | POA: Diagnosis not present

## 2018-10-29 MED ORDER — LOSARTAN POTASSIUM 50 MG PO TABS: 50.0000 mg | ORAL_TABLET | Freq: Every day | ORAL | 3 refills | Status: DC

## 2018-10-29 NOTE — Progress Notes (Signed)
Virtual Visit via [employer]  This visit type was conducted due to national recommendations for restrictions regarding the COVID-19 pandemic (e.g. social distancing).  This format is felt to be most appropriate for this patient at this time.  All issues noted in this document were discussed and addressed.  No physical exam was performed (except for noted visual exam findings with Video Visits).   I connected with@ on 10/29/18 at  1:30 PM EDT by a video enabled telemedicine application or telephone and verified that I am speaking with the correct person using two identifiers. Location patient: home Location provider: work or home office Persons participating in the virtual visit: patient, provider  I discussed the limitations, risks, security and privacy concerns of performing an evaluation and management service by telephone and the availability of in person appointments. I also discussed with the patient that there may be a patient responsible charge related to this service. The patient expressed understanding and agreed to proceed. Reason for visit: follow up  HPI:   6 month follow up on hypertension and hyperlipidemia  Patient is taking her medications (atorvastatin and lisinopril)  as prescribed and notes no adverse effects.  Home BP readings have been done about once per week and are  generally < 120/80 .  She is avoiding added salt in her diet and walking regularly about 3 times per week for exercise  .  Labs reviewed from April 9 and discussed with patient today   Overdue for colonoscopy   Due for virtual colonoscopy  Per pyrtle   ROS: See pertinent positives and negatives per HPI.  Past Medical History:  Diagnosis Date  . Hyperlipidemia   . Hypertension     Past Surgical History:  Procedure Laterality Date  . BREAST EXCISIONAL BIOPSY Left 1988   benign  . BREAST SURGERY Left 1986   for false positive/biopsy  . EYE SURGERY Left 6wks ago   cataract  . FRACTURE SURGERY Left  01/14/2011   ankle  . TONSILECTOMY, ADENOIDECTOMY, BILATERAL MYRINGOTOMY AND TUBES Bilateral 1957   does not believe adenoids were removed  . TUBAL LIGATION  y-37    Family History  Problem Relation Age of Onset  . Hyperlipidemia Mother   . Cancer Mother   . Arthritis Father   . Alzheimer's disease Father   . Colon cancer Paternal Aunt     SOCIAL HX: married, retired Geophysicist/field seismologist    Current Outpatient Medications:  .  atorvastatin (LIPITOR) 20 MG tablet, TAKE 1 TABLET BY MOUTH ONCE DAILY, Disp: 90 tablet, Rfl: 1 .  buPROPion (WELLBUTRIN SR) 150 MG 12 hr tablet, Take 1 tablet (150 mg total) by mouth 2 (two) times daily., Disp: 180 tablet, Rfl: 1 .  calcium carbonate (TUMS - DOSED IN MG ELEMENTAL CALCIUM) 500 MG chewable tablet, Chew 1 tablet (200 mg of elemental calcium total) by mouth 3 (three) times daily as needed for indigestion or heartburn., Disp: 30 tablet, Rfl: 0 .  Cholecalciferol (VITAMIN D3) 1000 UNITS CAPS, Take 1,000 Units by mouth daily., Disp: , Rfl:  .  famotidine (PEPCID) 20 MG tablet, Take 20 mg by mouth 2 (two) times daily., Disp: , Rfl:  .  valACYclovir (VALTREX) 1000 MG tablet, TAKE 2 TABLETS BY MOUTH TWICE A DAY, Disp: 20 tablet, Rfl: 0 .  losartan (COZAAR) 50 MG tablet, Take 1 tablet (50 mg total) by mouth daily., Disp: 90 tablet, Rfl: 3  Current Facility-Administered Medications:  .  cyanocobalamin ((VITAMIN B-12)) injection 1,000 mcg, 1,000 mcg, Intramuscular,  Once, Crecencio Mc, MD  EXAMTonette Bihari per patient if applicable:  GENERAL: alert, oriented, appears well and in no acute distress  HEENT: atraumatic, conjunttiva clear, no obvious abnormalities on inspection of external nose and ears  NECK: normal movements of the head and neck  LUNGS: on inspection no signs of respiratory distress, breathing rate appears normal, no obvious gross SOB, gasping or wheezing  CV: no obvious cyanosis  MS: moves all visible extremities without noticeable  abnormality  PSYCH/NEURO: pleasant and cooperative, no obvious depression or anxiety, speech and thought processing grossly intact  ASSESSMENT AND PLAN:  Discussed the following assessment and plan:  Colon cancer screening - Plan: Ambulatory referral to Gastroenterology  Breast cancer screening - Plan: MM 3D SCREEN BREAST BILATERAL  Essential hypertension, benign  Hyperlipidemia LDL goal <130  Essential hypertension, benign .Well controlled on current regimen. Renal function stable. 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  I also advised patient to to take it at night instead of morning,  as recent studies have shown a reduction in incidence of heart attacks and strokes.   Hyperlipidemia LDL goal <130 Managed with atorvastatin .  LFTs are normal, and LDL is at goal.  No changes today    Lab Results  Component Value Date   CHOL 190 10/23/2018   HDL 80.10 10/23/2018   LDLCALC 95 10/23/2018   LDLDIRECT 135.1 09/03/2013   TRIG 78.0 10/23/2018   CHOLHDL 2 10/23/2018   Lab Results  Component Value Date   ALT 13 10/23/2018   AST 14 10/23/2018   ALKPHOS 71 10/23/2018   BILITOT 0.6 10/23/2018    A total of 25 minutes of face to face time was spent with patient more than half of which was spent in counselling about the above mentioned conditions  and coordination of care    I discussed the assessment and treatment plan with the patient. The patient was provided an opportunity to ask questions and all were answered. The patient agreed with the plan and demonstrated an understanding of the instructions.   The patient was advised to call back or seek an in-person evaluation if the symptoms worsen or if the condition fails to improve as anticipated.  I provided  25 minutes of non-face-to-face time during this encounter.   Crecencio Mc, MD

## 2018-10-29 NOTE — Patient Instructions (Addendum)
i'm glad you are adjusting to the "new normal!"  Your labs look fabulous,  So continue the atorvastatin .   I am making a decision to change your lisinopril to losartan , based on increased reports by one of my ENT colleagues of patients  developing tongue and throat swelling from lisinopril.   The condition , called "angioedema," can be fatal if a person's airway is compromised.  I also want you to take it at night instead of morning,  s recent studies have shown that taking your blood pressure medications at night protects you better from heart attacks and strokes.

## 2018-10-30 NOTE — Assessment & Plan Note (Signed)
Managed with atorvastatin .  LFTs are normal, and LDL is at goal.  No changes today    Lab Results  Component Value Date   CHOL 190 10/23/2018   HDL 80.10 10/23/2018   LDLCALC 95 10/23/2018   LDLDIRECT 135.1 09/03/2013   TRIG 78.0 10/23/2018   CHOLHDL 2 10/23/2018   Lab Results  Component Value Date   ALT 13 10/23/2018   AST 14 10/23/2018   ALKPHOS 71 10/23/2018   BILITOT 0.6 10/23/2018

## 2018-10-30 NOTE — Assessment & Plan Note (Signed)
.  Well controlled on current regimen. Renal function stable. 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  I also advised patient to to take it at night instead of morning,  as recent studies have shown a reduction in incidence of heart attacks and strokes.

## 2018-12-16 ENCOUNTER — Ambulatory Visit: Payer: Self-pay

## 2018-12-16 NOTE — Telephone Encounter (Signed)
Pt with h/o HTN c/o elevated BP and headache and dizziness. Pt stated the dizziness is not a new symptoms. BP 161/84 and 159/84 this morning. Pt stated her BP medication was decreased by half in April. Pt denies chest pain, blurred vision, difficulty breathing, weakness. Smile is even per pt. Pt stated she is not going to the ED and requests to talk to her PCP. Call transferred to office to set up appt.  Reason for Disposition . [2] Systolic BP  >= 482 OR Diastolic >= 500 AND [3] cardiac or neurologic symptoms (e.g., chest pain, difficulty breathing, unsteady gait, blurred vision)  Answer Assessment - Initial Assessment Questions 1. BLOOD PRESSURE: "What is the blood pressure?" "Did you take at least two measurements 5 minutes apart?"     161/84  159/84  2. ONSET: "When did you take your blood pressure?"     0930-0945 3. HOW: "How did you obtain the blood pressure?" (e.g., visiting nurse, automatic home BP monitor)     Automatic home BP monitor 4. HISTORY: "Do you have a history of high blood pressure?"     yes 5. MEDICATIONS: "Are you taking any medications for blood pressure?" "Have you missed any doses recently?"     Yes-no-  But dose was lowered by half on 10/29/18 6. OTHER SYMPTOMS: "Do you have any symptoms?" (e.g., headache, chest pain, blurred vision, difficulty breathing, weakness)     Headache past 2 days, slight dizziness 7. PREGNANCY: "Is there any chance you are pregnant?" "When was your last menstrual period?"     n/a  Protocols used: HIGH BLOOD PRESSURE-A-AH

## 2018-12-17 ENCOUNTER — Ambulatory Visit (INDEPENDENT_AMBULATORY_CARE_PROVIDER_SITE_OTHER): Payer: Medicare Other | Admitting: Internal Medicine

## 2018-12-17 ENCOUNTER — Encounter: Payer: Self-pay | Admitting: Internal Medicine

## 2018-12-17 ENCOUNTER — Other Ambulatory Visit: Payer: Self-pay

## 2018-12-17 DIAGNOSIS — F329 Major depressive disorder, single episode, unspecified: Secondary | ICD-10-CM | POA: Diagnosis not present

## 2018-12-17 DIAGNOSIS — I10 Essential (primary) hypertension: Secondary | ICD-10-CM | POA: Diagnosis not present

## 2018-12-17 DIAGNOSIS — F430 Acute stress reaction: Secondary | ICD-10-CM | POA: Diagnosis not present

## 2018-12-17 DIAGNOSIS — F411 Generalized anxiety disorder: Secondary | ICD-10-CM

## 2018-12-17 DIAGNOSIS — F32A Depression, unspecified: Secondary | ICD-10-CM

## 2018-12-17 MED ORDER — ALPRAZOLAM 0.25 MG PO TABS: 0.2500 mg | ORAL_TABLET | Freq: Every day | ORAL | 0 refills | Status: DC | PRN

## 2018-12-17 NOTE — Progress Notes (Signed)
In April pt's blood pressure medication was changed from Lisinopril to Losartan. Pt stated that her blood pressure was doing fine until about a week ago. The pt stated that she is taking the Losartan 50mg  once daily. The blood pressure has been running 150-170/70-90. She stated that her blood pressure seems to be elevated in mornings because when she takes her blood pressure during the day the readings are 130-120/70-60.

## 2018-12-17 NOTE — Progress Notes (Signed)
Virtual Visit via [email or web address]  This visit type was conducted due to national recommendations for restrictions regarding the COVID-19 pandemic (e.g. social distancing).  This format is felt to be most appropriate for this patient at this time.  All issues noted in this document were discussed and addressed.  No physical exam was performed (except for noted visual exam findings with Video Visits).   I connected with@ on 12/20/18 at  9:00 AM EDT by a video enabled telemedicine application or telephone and verified that I am speaking with the correct person using two identifiers. Location patient: home Location provider: work or home office Persons participating in the virtual visit: patient, provider  I discussed the limitations, risks, security and privacy concerns of performing an evaluation and management service by telephone and the availability of in person appointments. I also discussed with the patient that there may be a patient responsible charge related to this service. The patient expressed understanding and agreed to proceed.  Reason for visit: hypertension follow up  HPI:  78 yr old female with history of hypertension, hyperlipidemia and overweight , last seen in April at which time lisinopril was changed to losartan.  Tolerating medication but readings have been elevated to > 269 systolic for the last several days and she has been feeling dizzy  Without true vertigo .  Has been under significant stress for the past year since her husband was  Disabled by a stroke, and her adult son is unemployable and dependent due to bipolar disorder. She admits to taking one of her husband's alprazolam when her BP was elevated and felt much better after 20 minutes,  And her repeat BP was significantly lower . She is requesting management of her anxiety and blood pressure.  She continues to report a benefit from the wellbutrin and is taking it appropriately, but feels overwhelmed at times having both a husband  and son who rely on her .      ROS: See pertinent positives and negatives per HPI.  Past Medical History:  Diagnosis Date  . Hyperlipidemia   . Hypertension     Past Surgical History:  Procedure Laterality Date  . BREAST EXCISIONAL BIOPSY Left 1988   benign  . BREAST SURGERY Left 1986   for false positive/biopsy  . EYE SURGERY Left 6wks ago   cataract  . FRACTURE SURGERY Left 01/14/2011   ankle  . TONSILECTOMY, ADENOIDECTOMY, BILATERAL MYRINGOTOMY AND TUBES Bilateral 1957   does not believe adenoids were removed  . TUBAL LIGATION  y-37    Family History  Problem Relation Age of Onset  . Hyperlipidemia Mother   . Cancer Mother   . Arthritis Father   . Alzheimer's disease Father   . Colon cancer Paternal Aunt      SOCIAL HX:  Social History   Tobacco Use  . Smoking status: Former Smoker    Packs/day: 2.00    Types: Cigarettes    Last attempt to quit: 03/28/1979    Years since quitting: 39.7  . Smokeless tobacco: Never Used  Substance Use Topics  . Alcohol use: Yes    Alcohol/week: 5.0 standard drinks    Types: 5 Glasses of wine per week  . Drug use: No     Current Outpatient Medications:  .  atorvastatin (LIPITOR) 20 MG tablet, TAKE 1 TABLET BY MOUTH ONCE DAILY, Disp: 90 tablet, Rfl: 1 .  buPROPion (WELLBUTRIN SR) 150 MG 12 hr tablet, Take 1 tablet (150 mg total) by mouth 2 (two)  times daily., Disp: 180 tablet, Rfl: 1 .  calcium carbonate (TUMS - DOSED IN MG ELEMENTAL CALCIUM) 500 MG chewable tablet, Chew 1 tablet (200 mg of elemental calcium total) by mouth 3 (three) times daily as needed for indigestion or heartburn., Disp: 30 tablet, Rfl: 0 .  Cholecalciferol (VITAMIN D3) 1000 UNITS CAPS, Take 1,000 Units by mouth daily., Disp: , Rfl:  .  famotidine (PEPCID) 20 MG tablet, Take 20 mg by mouth 2 (two) times daily., Disp: , Rfl:  .  losartan (COZAAR) 50 MG tablet, Take 1 tablet (50 mg total) by mouth daily., Disp: 90 tablet, Rfl: 3 .  valACYclovir (VALTREX)  1000 MG tablet, TAKE 2 TABLETS BY MOUTH TWICE A DAY, Disp: 20 tablet, Rfl: 0 .  ALPRAZolam (XANAX) 0.25 MG tablet, Take 1 tablet (0.25 mg total) by mouth daily as needed for anxiety., Disp: 20 tablet, Rfl: 0  Current Facility-Administered Medications:  .  cyanocobalamin ((VITAMIN B-12)) injection 1,000 mcg, 1,000 mcg, Intramuscular, Once, Derrel Nip, Aris Everts, MD  EXAM:  VITALS per patient if applicable:  GENERAL: alert, oriented, appears well and in no acute distress  HEENT: atraumatic, conjunttiva clear, no obvious abnormalities on inspection of external nose and ears  NECK: normal movements of the head and neck  LUNGS: on inspection no signs of respiratory distress, breathing rate appears normal, no obvious gross SOB, gasping or wheezing  CV: no obvious cyanosis  MS: moves all visible extremities without noticeable abnormality  PSYCH/NEURO: pleasant and cooperative, no obvious depression or anxiety, speech and thought processing grossly intact  ASSESSMENT AND PLAN:  Discussed the following assessment and plan:  Essential hypertension, benign  Anxiety as acute reaction to gross stress  Depressive disorder  Essential hypertension, benign Continue losartan at 50 mg daily taken in the evening.  Will treat anxiety ,  monitor readings and increase dose to 100 mg if subsequent readings are consistently > 960 systolic   Anxiety as acute reaction to gross stress The risks and benefits of benzodiazepine use were discussed with patient today including excessive sedation leading to respiratory depression,  impaired thinking/driving, and addiction.  Patient was advised to avoid concurrent use with alcohol, to use medication only as needed and not to share with others  .   Depressive disorder She notes continued need for wellbutrin. Refills given.     I discussed the assessment and treatment plan with the patient. The patient was provided an opportunity to ask questions and all were  answered. The patient agreed with the plan and demonstrated an understanding of the instructions.   The patient was advised to call back or seek an in-person evaluation if the symptoms worsen or if the condition fails to improve as anticipated.  I provided 25 minutes of non-face-to-face time during this encounter.   Crecencio Mc, MD

## 2018-12-20 DIAGNOSIS — F411 Generalized anxiety disorder: Secondary | ICD-10-CM | POA: Insufficient documentation

## 2018-12-20 NOTE — Assessment & Plan Note (Signed)
She notes continued need for wellbutrin. Refills given.

## 2018-12-20 NOTE — Assessment & Plan Note (Signed)
Continue losartan at 50 mg daily taken in the evening.  Will treat anxiety ,  monitor readings and increase dose to 100 mg if subsequent readings are consistently > 707 systolic

## 2018-12-20 NOTE — Assessment & Plan Note (Signed)
The risks and benefits of benzodiazepine use were discussed with patient today including excessive sedation leading to respiratory depression,  impaired thinking/driving, and addiction.  Patient was advised to avoid concurrent use with alcohol, to use medication only as needed and not to share with others  .  

## 2019-01-26 ENCOUNTER — Ambulatory Visit: Payer: Self-pay

## 2019-02-02 ENCOUNTER — Other Ambulatory Visit: Payer: Self-pay

## 2019-02-02 MED ORDER — BUPROPION HCL ER (SR) 150 MG PO TB12: 150.0000 mg | ORAL_TABLET | Freq: Two times a day (BID) | ORAL | 1 refills | Status: DC

## 2019-02-02 MED ORDER — ATORVASTATIN CALCIUM 20 MG PO TABS: 20.0000 mg | ORAL_TABLET | Freq: Every day | ORAL | 1 refills | Status: DC

## 2019-03-10 ENCOUNTER — Other Ambulatory Visit: Payer: Self-pay

## 2019-03-10 ENCOUNTER — Ambulatory Visit
Admission: RE | Admit: 2019-03-10 | Discharge: 2019-03-10 | Disposition: A | Discharge status: Home / Self Care | Payer: Medicare Other | Source: Ambulatory Visit | Attending: Internal Medicine | Admitting: Internal Medicine

## 2019-03-10 DIAGNOSIS — Z1231 Encounter for screening mammogram for malignant neoplasm of breast: Secondary | ICD-10-CM | POA: Diagnosis not present

## 2019-03-10 DIAGNOSIS — Z1239 Encounter for other screening for malignant neoplasm of breast: Secondary | ICD-10-CM

## 2019-04-08 ENCOUNTER — Ambulatory Visit (INDEPENDENT_AMBULATORY_CARE_PROVIDER_SITE_OTHER): Payer: Medicare Other

## 2019-04-08 ENCOUNTER — Other Ambulatory Visit: Payer: Self-pay

## 2019-04-08 DIAGNOSIS — Z23 Encounter for immunization: Secondary | ICD-10-CM | POA: Diagnosis not present

## 2019-08-10 ENCOUNTER — Other Ambulatory Visit: Payer: Self-pay

## 2019-08-10 MED ORDER — LOSARTAN POTASSIUM 50 MG PO TABS: 50.0000 mg | ORAL_TABLET | Freq: Every day | ORAL | 1 refills | Status: DC

## 2019-08-10 MED ORDER — ATORVASTATIN CALCIUM 20 MG PO TABS: 20.0000 mg | ORAL_TABLET | Freq: Every day | ORAL | 1 refills | Status: DC

## 2019-08-12 ENCOUNTER — Ambulatory Visit: Payer: Medicare Other | Admitting: Internal Medicine

## 2019-08-31 ENCOUNTER — Ambulatory Visit: Payer: Medicare Other | Admitting: Internal Medicine

## 2019-09-18 ENCOUNTER — Telehealth: Payer: Self-pay | Admitting: Internal Medicine

## 2019-09-18 ENCOUNTER — Other Ambulatory Visit: Payer: Self-pay

## 2019-09-18 MED ORDER — VALACYCLOVIR HCL 1 G PO TABS: 2000.0000 mg | ORAL_TABLET | Freq: Two times a day (BID) | ORAL | 0 refills | Status: DC

## 2019-09-18 NOTE — Telephone Encounter (Signed)
Pt needs refill on valACYclovir (VALTREX) 1000 MG tablet. Pt needs it asap

## 2019-09-25 ENCOUNTER — Telehealth: Payer: Self-pay | Admitting: Internal Medicine

## 2019-09-25 NOTE — Telephone Encounter (Signed)
Left message for patient to call back and schedule Medicare Annual Wellness Visit (AWV) either virtually or audio only.  Last AWV 10.11.17; please schedule at anytime with Denisa O'Brien-Blaney at [facility]

## 2019-09-30 ENCOUNTER — Encounter: Payer: Self-pay | Admitting: Internal Medicine

## 2019-09-30 ENCOUNTER — Telehealth (INDEPENDENT_AMBULATORY_CARE_PROVIDER_SITE_OTHER): Payer: Medicare Other | Admitting: Internal Medicine

## 2019-09-30 VITALS — BP 129/70 | HR 77 | Temp 97.4°F | Ht 63.0 in | Wt 187.0 lb

## 2019-09-30 DIAGNOSIS — F32A Depression, unspecified: Secondary | ICD-10-CM

## 2019-09-30 DIAGNOSIS — F411 Generalized anxiety disorder: Secondary | ICD-10-CM

## 2019-09-30 DIAGNOSIS — E785 Hyperlipidemia, unspecified: Secondary | ICD-10-CM

## 2019-09-30 DIAGNOSIS — F329 Major depressive disorder, single episode, unspecified: Secondary | ICD-10-CM | POA: Diagnosis not present

## 2019-09-30 DIAGNOSIS — E663 Overweight: Secondary | ICD-10-CM

## 2019-09-30 DIAGNOSIS — F430 Acute stress reaction: Secondary | ICD-10-CM

## 2019-09-30 DIAGNOSIS — I10 Essential (primary) hypertension: Secondary | ICD-10-CM | POA: Diagnosis not present

## 2019-09-30 MED ORDER — BUPROPION HCL ER (SR) 100 MG PO TB12: 100.0000 mg | ORAL_TABLET | Freq: Two times a day (BID) | ORAL | 2 refills | Status: DC

## 2019-09-30 NOTE — Progress Notes (Signed)
Virtual Visit via [place]  This visit type was conducted due to national recommendations for restrictions regarding the COVID-19 pandemic (e.g. social distancing).  This format is felt to be most appropriate for this patient at this time.  All issues noted in this document were discussed and addressed.  No physical exam was performed (except for noted visual exam findings with Video Visits).   I connected with@ on 09/30/19 at  2:30 PM EDT by a video enabled telemedicine application or telephone and verified that I am speaking with the correct person using two identifiers. Location patient: home Location provider: work or home office Persons participating in the virtual visit: patient, provider  I discussed the limitations, risks, security and privacy concerns of performing an evaluation and management service by telephone and the availability of in person appointments. I also discussed with the patient that there may be a patient responsible charge related to this service. The patient expressed understanding and agreed to proceed.  Reason for visit: follow up on hypertension, hyperlipidemia and obesity  HPI:   79 yr old female [place] GAD/DEPRESSION overweight ,  last seen June 2020.    Patient has received both doses of the available COVID 19 vaccine without complications.  Patient continues to mask when outside of the home except when walking in yard or at safe distances from others .  Patient denies any change in mood or development of unhealthy behaviors resuting from the pandemic's restriction of activities and socialization.     Obesity:  Gaining weight   Not walking  Taking care of husband and son.   Marland Kitchen  HM:    Defers colonsocopy for now due to increased stress at home.    Mammogram due in August   GAD/Depression:  Taking alprazolam  rarely  . Taking wellbutrin at  1/4 tablet prn,  Not regularly .  Only once daily at most bc it interferes with her sleep  otherwise.    Hypertension: patient checks blood pressure twice weekly at home.  Readings have been for the most part < 140/80 at rest . Patient is following a reduce salt diet most days and is taking medications as prescribed     ROS: Patient denies headache, fevers, malaise, unintentional weight loss, skin rash, eye pain, sinus congestion and sinus pain, sore throat, dysphagia,  hemoptysis , cough, dyspnea, wheezing, chest pain, palpitations, orthopnea, edema, abdominal pain, nausea, melena, diarrhea, constipation, flank pain, dysuria, hematuria, urinary  Frequency, nocturia, numbness, tingling, seizures,  Focal weakness, Loss of consciousness,  Tremor, , and suicidal ideation.      Past Medical History:  Diagnosis Date  . Hyperlipidemia   . Hypertension     Past Surgical History:  Procedure Laterality Date  . BREAST EXCISIONAL BIOPSY Left 1988   benign  . BREAST SURGERY Left 1986   for false positive/biopsy  . EYE SURGERY Left 6wks ago   cataract  . FRACTURE SURGERY Left 01/14/2011   ankle  . TONSILECTOMY, ADENOIDECTOMY, BILATERAL MYRINGOTOMY AND TUBES Bilateral 1957   does not believe adenoids were removed  . TUBAL LIGATION  y-37    Family History  Problem Relation Age of Onset  . Hyperlipidemia Mother   . Cancer Mother   . Arthritis Father   . Alzheimer's disease Father   . Colon cancer Paternal Aunt     SOCIAL HX:  reports that she quit smoking about 40 years ago. Her smoking use included cigarettes. She smoked 2.00 packs per day. She has never  used smokeless tobacco. She reports current alcohol use of about 5.0 standard drinks of alcohol per week. She reports that she does not use drugs.   Current Outpatient Medications:  .  ALPRAZolam (XANAX) 0.25 MG tablet, Take 1 tablet (0.25 mg total) by mouth daily as needed for anxiety., Disp: 20 tablet, Rfl: 0 .  atorvastatin (LIPITOR) 20 MG tablet, Take 1 tablet (20 mg total) by mouth daily., Disp: 90 tablet, Rfl: 1 .   buPROPion (WELLBUTRIN SR) 100 MG 12 hr tablet, Take 1 tablet (100 mg total) by mouth 2 (two) times daily., Disp: 60 tablet, Rfl: 2 .  calcium carbonate (TUMS - DOSED IN MG ELEMENTAL CALCIUM) 500 MG chewable tablet, Chew 1 tablet (200 mg of elemental calcium total) by mouth 3 (three) times daily as needed for indigestion or heartburn., Disp: 30 tablet, Rfl: 0 .  Cholecalciferol (VITAMIN D3) 1000 UNITS CAPS, Take 1,000 Units by mouth daily., Disp: , Rfl:  .  famotidine (PEPCID) 20 MG tablet, Take 20 mg by mouth 2 (two) times daily., Disp: , Rfl:  .  losartan (COZAAR) 50 MG tablet, Take 1 tablet (50 mg total) by mouth daily., Disp: 90 tablet, Rfl: 1 .  valACYclovir (VALTREX) 1000 MG tablet, Take 2 tablets (2,000 mg total) by mouth 2 (two) times daily., Disp: 20 tablet, Rfl: 0  Current Facility-Administered Medications:  .  cyanocobalamin ((VITAMIN B-12)) injection 1,000 mcg, 1,000 mcg, Intramuscular, Once, Derrel Nip, Aris Everts, MD  EXAM:  VITALS per patient if applicable:  GENERAL: alert, oriented, appears well and in no acute distress  HEENT: atraumatic, conjunttiva clear, no obvious abnormalities on inspection of external nose and ears  NECK: normal movements of the head and neck  LUNGS: on inspection no signs of respiratory distress, breathing rate appears normal, no obvious gross SOB, gasping or wheezing  CV: no obvious cyanosis  MS: moves all visible extremities without noticeable abnormality  PSYCH/NEURO: pleasant and cooperative, no obvious depression or anxiety, speech and thought processing grossly intact  ASSESSMENT AND PLAN:  Discussed the following assessment and plan:  Hyperlipidemia LDL goal <130 - Plan: Lipid panel  Essential hypertension, benign - Plan: Comprehensive metabolic panel  Overweight (BMI 25.0-29.9)  Depressive disorder  Anxiety as acute reaction to gross stress  Essential hypertension, benign Well controlled on current regimen. Renal function is due for  reassessment , no changes today.  Lab Results  Component Value Date   CREATININE 0.76 10/23/2018     Hyperlipidemia LDL goal <130 Managed with atorvastatin . Fasting lipidsa nd LFTs are in need of surveillance. .  No changes today    Lab Results  Component Value Date   CHOL 190 10/23/2018   HDL 80.10 10/23/2018   LDLCALC 95 10/23/2018   LDLDIRECT 135.1 09/03/2013   TRIG 78.0 10/23/2018   CHOLHDL 2 10/23/2018   Lab Results  Component Value Date   ALT 13 10/23/2018   AST 14 10/23/2018   ALKPHOS 71 10/23/2018   BILITOT 0.6 10/23/2018     Overweight (BMI 25.0-29.9) She is gaining weight due to self neglect .  Her husband Delfino Lovett suffered a stroke last year and requires assistance in ambulating.  Her grown son is also dependent on her because he has bipolar disorder. I have  encouraged  Continued attempts at weight loss with goal of 10% of body weigh over the next 6 months using a low glycemic index diet and regular exercise a minimum of 5 days per week.    Depressive disorder She  denies any untreated symptoms of depression or anxiety but would like to remain on low dose of  wellbutrin and continue prn use of alprazolam. Refills given but dose changed to 100 mg to improve conttinuity of   Anxiety as acute reaction to gross stress The risks and benefits of benzodiazepine use were reviewed  with patient today including excessive sedation leading to respiratory depression,  impaired thinking/driving, and addiction.  Patient was advised to avoid concurrent use with alcohol, to use medication only as needed and not to share with others  .     I discussed the assessment and treatment plan with the patient. The patient was provided an opportunity to ask questions and all were answered. The patient agreed with the plan and demonstrated an understanding of the instructions.   The patient was advised to call back or seek an in-person evaluation if the symptoms worsen or if the condition  fails to improve as anticipated.   I provided  30 minutes of non-face-to-face time during this encounter reviewing patient's current problems and past surgeries, labs and imaging studies, providing counseling on the above mentioned problems , and coordination  of care .   Crecencio Mc, MD

## 2019-10-02 NOTE — Assessment & Plan Note (Signed)
The risks and benefits of benzodiazepine use were reviewed  with patient today including excessive sedation leading to respiratory depression,  impaired thinking/driving, and addiction.  Patient was advised to avoid concurrent use with alcohol, to use medication only as needed and not to share with others  .

## 2019-10-02 NOTE — Assessment & Plan Note (Signed)
She is gaining weight due to self neglect .  Her husband Delfino Lovett suffered a stroke last year and requires assistance in ambulating.  Her grown son is also dependent on her because he has bipolar disorder. I have  encouraged  Continued attempts at weight loss with goal of 10% of body weigh over the next 6 months using a low glycemic index diet and regular exercise a minimum of 5 days per week.

## 2019-10-02 NOTE — Assessment & Plan Note (Addendum)
Managed with atorvastatin . Fasting lipidsa nd LFTs are in need of surveillance. .  No changes today    Lab Results  Component Value Date   CHOL 190 10/23/2018   HDL 80.10 10/23/2018   LDLCALC 95 10/23/2018   LDLDIRECT 135.1 09/03/2013   TRIG 78.0 10/23/2018   CHOLHDL 2 10/23/2018   Lab Results  Component Value Date   ALT 13 10/23/2018   AST 14 10/23/2018   ALKPHOS 71 10/23/2018   BILITOT 0.6 10/23/2018

## 2019-10-02 NOTE — Assessment & Plan Note (Addendum)
She denies any untreated symptoms of depression or anxiety but would like to remain on low dose of  wellbutrin and continue prn use of alprazolam. Refills given but dose changed to 100 mg to improve conttinuity of

## 2019-10-02 NOTE — Assessment & Plan Note (Signed)
Well controlled on current regimen. Renal function is due for reassessment , no changes today.  Lab Results  Component Value Date   CREATININE 0.76 10/23/2018

## 2019-10-06 ENCOUNTER — Other Ambulatory Visit: Payer: Self-pay

## 2019-10-13 ENCOUNTER — Encounter: Payer: Self-pay | Admitting: Emergency Medicine

## 2019-10-13 ENCOUNTER — Telehealth: Payer: Self-pay | Admitting: Internal Medicine

## 2019-10-13 ENCOUNTER — Other Ambulatory Visit: Payer: Self-pay

## 2019-10-13 ENCOUNTER — Emergency Department: Payer: Medicare Other

## 2019-10-13 ENCOUNTER — Emergency Department
Admission: EM | Admit: 2019-10-13 | Discharge: 2019-10-13 | Disposition: A | Discharge status: Home / Self Care | Payer: Medicare Other | Attending: Student in an Organized Health Care Education/Training Program | Admitting: Student in an Organized Health Care Education/Training Program

## 2019-10-13 DIAGNOSIS — Z79899 Other long term (current) drug therapy: Secondary | ICD-10-CM | POA: Insufficient documentation

## 2019-10-13 DIAGNOSIS — Z6379 Other stressful life events affecting family and household: Secondary | ICD-10-CM | POA: Insufficient documentation

## 2019-10-13 DIAGNOSIS — I10 Essential (primary) hypertension: Secondary | ICD-10-CM | POA: Insufficient documentation

## 2019-10-13 DIAGNOSIS — K30 Functional dyspepsia: Secondary | ICD-10-CM | POA: Diagnosis not present

## 2019-10-13 DIAGNOSIS — R42 Dizziness and giddiness: Secondary | ICD-10-CM | POA: Diagnosis not present

## 2019-10-13 DIAGNOSIS — Z87891 Personal history of nicotine dependence: Secondary | ICD-10-CM | POA: Insufficient documentation

## 2019-10-13 LAB — COMPREHENSIVE METABOLIC PANEL
ALT: 19 U/L (ref 0–44)
AST: 19 U/L (ref 15–41)
Albumin: 4.3 g/dL (ref 3.5–5.0)
Alkaline Phosphatase: 80 U/L (ref 38–126)
Anion gap: 11 (ref 5–15)
BUN: 26 mg/dL — ABNORMAL HIGH (ref 8–23)
CO2: 26 mmol/L (ref 22–32)
Calcium: 9.6 mg/dL (ref 8.9–10.3)
Chloride: 99 mmol/L (ref 98–111)
Creatinine, Ser: 0.72 mg/dL (ref 0.44–1.00)
GFR calc Af Amer: >60 mL/min (ref >60)
GFR calc non Af Amer: >60 mL/min (ref >60)
Glucose, Bld: 113 mg/dL — ABNORMAL HIGH (ref 70–99)
Potassium: 3.8 mmol/L (ref 3.5–5.1)
Sodium: 136 mmol/L (ref 135–145)
Total Bilirubin: 0.7 mg/dL (ref 0.3–1.2)
Total Protein: 7.5 g/dL (ref 6.5–8.1)

## 2019-10-13 LAB — CBC WITH DIFFERENTIAL/PLATELET
Abs Immature Granulocytes: 0.11 10*3/uL — ABNORMAL HIGH (ref 0.00–0.07)
Basophils Absolute: 0.1 10*3/uL (ref 0.0–0.1)
Basophils Relative: 1 %
Eosinophils Absolute: 0.2 10*3/uL (ref 0.0–0.5)
Eosinophils Relative: 2 %
HCT: 43 % (ref 36.0–46.0)
Hemoglobin: 14.1 g/dL (ref 12.0–15.0)
Immature Granulocytes: 1 %
Lymphocytes Relative: 28 %
Lymphs Abs: 2.5 10*3/uL (ref 0.7–4.0)
MCH: 28.9 pg (ref 26.0–34.0)
MCHC: 32.8 g/dL (ref 30.0–36.0)
MCV: 88.1 fL (ref 80.0–100.0)
Monocytes Absolute: 0.8 10*3/uL (ref 0.1–1.0)
Monocytes Relative: 9 %
Neutro Abs: 5.1 10*3/uL (ref 1.7–7.7)
Neutrophils Relative %: 59 %
Platelets: 278 10*3/uL (ref 150–400)
RBC: 4.88 MIL/uL (ref 3.87–5.11)
RDW: 13.2 % (ref 11.5–15.5)
WBC: 8.7 10*3/uL (ref 4.0–10.5)
nRBC: 0 % (ref 0.0–0.2)

## 2019-10-13 LAB — TROPONIN I (HIGH SENSITIVITY)
Troponin I (High Sensitivity): 6 ng/L (ref <18)
Troponin I (High Sensitivity): 7 ng/L (ref <18)

## 2019-10-13 MED ORDER — AMLODIPINE BESYLATE 5 MG PO TABS: 5.0000 mg | ORAL_TABLET | Freq: Every day | ORAL | 0 refills | Status: DC | PRN

## 2019-10-13 MED ORDER — AMLODIPINE BESYLATE 5 MG PO TABS
5.0000 mg | ORAL_TABLET | Freq: Once | ORAL | Status: AC
  Administered 2019-10-13: 5 mg via ORAL
  Filled 2019-10-13: qty 1

## 2019-10-13 NOTE — Telephone Encounter (Signed)
Pt was seen at [facility] ED today for elevated bp and dizziness. She was started on Amlodipine 5mg  daily. They are wanting her to follow up with you this week however there is no available appts for this week.

## 2019-10-13 NOTE — Telephone Encounter (Signed)
Spoken to patient, informed her to go to ED she stated she will go due to her sx. She has taken two Asprin this morning, dizziness comes and goes. NO jaw,arm, chest, and SOB. She does feel anxious about situation.she has taken .25mg  xanax today due to anxiousness. Patient will have Son take her to ED. She is located 2 miles from hospital.

## 2019-10-13 NOTE — Telephone Encounter (Signed)
Pt called and states that her blood pressure is 197/107 and she is dizzy. I transferred to [place] to be triaged.

## 2019-10-13 NOTE — Telephone Encounter (Signed)
Pt went to [facility] ED and they sent her home after they treated her but want her to have a follow up with Dr Derrel Nip this week. There were no appts avail-please call back to schedule

## 2019-10-13 NOTE — Telephone Encounter (Signed)
ER vsit reviewed  .   Nothing concerning.  Agree with amlodipine 5 mg daily and reassess BP on Thursday .  Can she do a virtual at 12:30 on Thursday?

## 2019-10-13 NOTE — Telephone Encounter (Signed)
LMTCB

## 2019-10-13 NOTE — ED Triage Notes (Signed)
Pt in via POV, reports checking BP regularly at home, noticing it was elevated today.  Pt does reports some associated dizziness.  Reports taking routine BP medication as prescribed.  Ambulatory to triage, NAD noted at this time.

## 2019-10-13 NOTE — ED Provider Notes (Signed)
[facility] Emergency Department Provider Note    First MD Initiated Contact with Patient 10/13/19 1202     (approximate)  I have reviewed the triage vital signs and the nursing notes.   HISTORY  Chief Complaint Hypertension    HPI Kara Harvey is a 79 y.o. female with the below listed past medical history presents to the ER for evaluation of elevated blood pressure.  States that she has been checking it daily when she checks her husband's blood pressure and noted that today it was around 190.  States that she has been undergoing increased stressors at home caring for her husband and her son.  She did have some indigestion just upon arrival.  States that she has had brief episodes of lightheadedness lasting only a few seconds over the past few days.  No numbness or tingling.  She is currently on losartan.  Has not made any medication changes.  Denies any shortness of breath.  No lower extremity swelling.    Past Medical History:  Diagnosis Date  . Hyperlipidemia   . Hypertension    Family History  Problem Relation Age of Onset  . Hyperlipidemia Mother   . Cancer Mother   . Arthritis Father   . Alzheimer's disease Father   . Colon cancer Paternal Aunt    Past Surgical History:  Procedure Laterality Date  . BREAST EXCISIONAL BIOPSY Left 1988   benign  . BREAST SURGERY Left 1986   for false positive/biopsy  . EYE SURGERY Left 6wks ago   cataract  . FRACTURE SURGERY Left 01/14/2011   ankle  . TONSILECTOMY, ADENOIDECTOMY, BILATERAL MYRINGOTOMY AND TUBES Bilateral 1957   does not believe adenoids were removed  . TUBAL LIGATION  y-37   Patient Active Problem List   Diagnosis Date Noted  . Anxiety as acute reaction to gross stress 12/20/2018  . Humerus fracture 07/13/2017  . Fracture of finger, left, closed 04/28/2017  . Vitamin D deficiency 04/29/2016  . Osteopenia 04/26/2016  . Long-term use of high-risk medication 01/25/2016  . Depressive  disorder 01/25/2016  . Closed malleolar fracture, unspecified laterality, with routine healing, subsequent encounter 03/04/2014  . Cataract extraction status of left eye 03/04/2014  . Overweight (BMI 25.0-29.9) 09/05/2013  . Urge incontinence 03/27/2013  . Essential hypertension, benign 03/27/2013  . Hyperlipidemia LDL goal <130 03/27/2013      Prior to Admission medications   Medication Sig Start Date End Date Taking? Authorizing Provider  ALPRAZolam (XANAX) 0.25 MG tablet Take 1 tablet (0.25 mg total) by mouth daily as needed for anxiety. 12/17/18   Crecencio Mc, MD  amLODipine (NORVASC) 5 MG tablet Take 1 tablet (5 mg total) by mouth daily as needed. 10/13/19 10/12/20  Merlyn Lot, MD  atorvastatin (LIPITOR) 20 MG tablet Take 1 tablet (20 mg total) by mouth daily. 08/10/19   Crecencio Mc, MD  buPROPion (WELLBUTRIN SR) 100 MG 12 hr tablet Take 1 tablet (100 mg total) by mouth 2 (two) times daily. 09/30/19   Crecencio Mc, MD  calcium carbonate (TUMS - DOSED IN MG ELEMENTAL CALCIUM) 500 MG chewable tablet Chew 1 tablet (200 mg of elemental calcium total) by mouth 3 (three) times daily as needed for indigestion or heartburn. 07/14/17   Epifanio Lesches, MD  Cholecalciferol (VITAMIN D3) 1000 UNITS CAPS Take 1,000 Units by mouth daily.    [provider]  famotidine (PEPCID) 20 MG tablet Take 20 mg by mouth 2 (two) times daily.  [provider]  losartan (COZAAR) 50 MG tablet Take 1 tablet (50 mg total) by mouth daily. 08/10/19   Crecencio Mc, MD  valACYclovir (VALTREX) 1000 MG tablet Take 2 tablets (2,000 mg total) by mouth 2 (two) times daily. 09/18/19   Crecencio Mc, MD    Allergies Sulfa antibiotics    Social History Social History   Tobacco Use  . Smoking status: Former Smoker    Packs/day: 2.00    Types: Cigarettes    Quit date: 03/28/1979    Years since quitting: 40.5  . Smokeless tobacco: Never Used  Substance Use Topics  . Alcohol use:  Yes    Alcohol/week: 5.0 standard drinks    Types: 5 Glasses of wine per week  . Drug use: No    Review of Systems Patient denies headaches, rhinorrhea, blurry vision, numbness, shortness of breath, chest pain, edema, cough, abdominal pain, nausea, vomiting, diarrhea, dysuria, fevers, rashes or hallucinations unless otherwise stated above in HPI. ____________________________________________   PHYSICAL EXAM:  VITAL SIGNS: Vitals:   10/13/19 1351 10/13/19 1523  BP: (!) 164/94 137/89  Pulse: 86 91  Resp: 18 15  Temp:    SpO2: 97% 96%    Constitutional: Alert and oriented.  Eyes: Conjunctivae are normal.  Head: Atraumatic. Nose: No congestion/rhinnorhea. Mouth/Throat: Mucous membranes are moist.   Neck: No stridor. Painless ROM.  Cardiovascular: Normal rate, regular rhythm. Grossly normal heart sounds.  Good peripheral circulation. Respiratory: Normal respiratory effort.  No retractions. Lungs CTAB. Gastrointestinal: Soft and nontender. No distention. No abdominal bruits. No CVA tenderness. Genitourinary:  Musculoskeletal: No lower extremity tenderness nor edema.  No joint effusions. Neurologic:  CN- intact.  No facial droop, Normal FNF.  Normal heel to shin.  Sensation intact bilaterally. Normal speech and language. No gross focal neurologic deficits are appreciated. No gait instability. Skin:  Skin is warm, dry and intact. No rash noted. Psychiatric: Mood and affect are normal. Speech and behavior are normal.  ____________________________________________   LABS (all labs ordered are listed, but only abnormal results are displayed)  Results for orders placed or performed during the hospital encounter of 10/13/19 (from the past 24 hour(s))  CBC with Differential/Platelet     Status: Abnormal   Collection Time: 10/13/19 11:54 AM  Result Value Ref Range   WBC 8.7 4.0 - 10.5 K/uL   RBC 4.88 3.87 - 5.11 MIL/uL   Hemoglobin 14.1 12.0 - 15.0 g/dL   HCT 43.0 36.0 - 46.0 %    MCV 88.1 80.0 - 100.0 fL   MCH 28.9 26.0 - 34.0 pg   MCHC 32.8 30.0 - 36.0 g/dL   RDW 13.2 11.5 - 15.5 %   Platelets 278 150 - 400 K/uL   nRBC 0.0 0.0 - 0.2 %   Neutrophils Relative % 59 %   Neutro Abs 5.1 1.7 - 7.7 K/uL   Lymphocytes Relative 28 %   Lymphs Abs 2.5 0.7 - 4.0 K/uL   Monocytes Relative 9 %   Monocytes Absolute 0.8 0.1 - 1.0 K/uL   Eosinophils Relative 2 %   Eosinophils Absolute 0.2 0.0 - 0.5 K/uL   Basophils Relative 1 %   Basophils Absolute 0.1 0.0 - 0.1 K/uL   Immature Granulocytes 1 %   Abs Immature Granulocytes 0.11 (H) 0.00 - 0.07 K/uL  Comprehensive metabolic panel     Status: Abnormal   Collection Time: 10/13/19 11:54 AM  Result Value Ref Range   Sodium 136 135 - 145 mmol/L  Potassium 3.8 3.5 - 5.1 mmol/L   Chloride 99 98 - 111 mmol/L   CO2 26 22 - 32 mmol/L   Glucose, Bld 113 (H) 70 - 99 mg/dL   BUN 26 (H) 8 - 23 mg/dL   Creatinine, Ser 0.72 0.44 - 1.00 mg/dL   Calcium 9.6 8.9 - 10.3 mg/dL   Total Protein 7.5 6.5 - 8.1 g/dL   Albumin 4.3 3.5 - 5.0 g/dL   AST 19 15 - 41 U/L   ALT 19 0 - 44 U/L   Alkaline Phosphatase 80 38 - 126 U/L   Total Bilirubin 0.7 0.3 - 1.2 mg/dL   GFR calc non Af Amer >60 >60 mL/min   GFR calc Af Amer >60 >60 mL/min   Anion gap 11 5 - 15  Troponin I (High Sensitivity)     Status: None   Collection Time: 10/13/19 11:54 AM  Result Value Ref Range   Troponin I (High Sensitivity) 6 <18 ng/L  Troponin I (High Sensitivity)     Status: None   Collection Time: 10/13/19  2:22 PM  Result Value Ref Range   Troponin I (High Sensitivity) 7 <18 ng/L   ____________________________________________  EKG My review and personal interpretation at Time: 11:49   Indication: htn  Rate: 95  Rhythm: sinus Axis: normal Other: normal intervals, no stemi ____________________________________________  RADIOLOGY  I personally reviewed all radiographic images ordered to evaluate for the above acute complaints and reviewed radiology reports and  findings.  These findings were personally discussed with the patient.  Please see medical record for radiology report.  ____________________________________________   PROCEDURES  Procedure(s) performed:  Procedures    Critical Care performed: no ____________________________________________   INITIAL IMPRESSION / ASSESSMENT AND PLAN / ED COURSE  Pertinent labs & imaging results that were available during my care of the patient were reviewed by me and considered in my medical decision making (see chart for details).   DDX: htn, chf, medication, stress, aki  Kara Harvey is a 79 y.o. who presents to the ED with symptoms as described above.  She is well-appearing and in no acute distress.  EKG is nonischemic.  Does report some indigestion symptoms but her abdominal exam is benign.  Will observe in the ER for serial enzymes.  Will give Norvasc.  No signs or symptoms of CVA.  The patient will be placed on continuous pulse oximetry and telemetry for monitoring.  Laboratory evaluation will be sent to evaluate for the above complaints.     Clinical Course as of Oct 12 1524  Tue Oct 13, 2019  1451 Serial enzymes are negative.  Patient reassessed.  She remains asymptomatic and in no acute distress.  We will give her a short prescription for additional Norvasc for additional blood pressure control until she can follow-up with her PCP she was instructed to only take this medication as needed for blood pressure greater than 150.  Have discussed with the patient and available family all diagnostics and treatments performed thus far and all questions were answered to the best of my ability. The patient demonstrates understanding and agreement with plan.    [PR]    Clinical Course User Index [PR] Merlyn Lot, MD    The patient was evaluated in Emergency Department today for the symptoms described in the history of present illness. He/she was evaluated in the context of the global  COVID-19 pandemic, which necessitated consideration that the patient might be at risk for infection with the SARS-CoV-2  virus that causes COVID-19. Institutional protocols and algorithms that pertain to the evaluation of patients at risk for COVID-19 are in a state of rapid change based on information released by regulatory bodies including the CDC and federal and state organizations. These policies and algorithms were followed during the patient's care in the ED.  As part of my medical decision making, I reviewed the following data within the [facility] notes reviewed and incorporated, Labs reviewed, notes from prior ED visits and [place] Controlled Substance Database   ____________________________________________   FINAL CLINICAL IMPRESSION(S) / ED DIAGNOSES  Final diagnoses:  Hypertension, unspecified type      NEW MEDICATIONS STARTED DURING THIS VISIT:  New Prescriptions   AMLODIPINE (NORVASC) 5 MG TABLET    Take 1 tablet (5 mg total) by mouth daily as needed.     Note:  This document was prepared using Dragon voice recognition software and may include unintentional dictation errors.    Merlyn Lot, MD 10/13/19 [phone]

## 2019-10-14 NOTE — Telephone Encounter (Signed)
Pt called returning your call. I scheduled her for Virtual on 4/1 @ 12:30 per note below

## 2019-10-15 ENCOUNTER — Encounter: Payer: Self-pay | Admitting: Internal Medicine

## 2019-10-15 ENCOUNTER — Telehealth (INDEPENDENT_AMBULATORY_CARE_PROVIDER_SITE_OTHER): Payer: Medicare Other | Admitting: Internal Medicine

## 2019-10-15 DIAGNOSIS — F329 Major depressive disorder, single episode, unspecified: Secondary | ICD-10-CM

## 2019-10-15 DIAGNOSIS — I10 Essential (primary) hypertension: Secondary | ICD-10-CM

## 2019-10-15 DIAGNOSIS — F32A Depression, unspecified: Secondary | ICD-10-CM

## 2019-10-15 NOTE — Progress Notes (Signed)
165/84 this morning when she woke up but pt stated that she does not know if she took her blood pressure medication last night.   Was started on Amlodipine 5 mg if blood pressure got over 0000000 systolic.  Pt took the amlodipine at 9:15am this morning and about an hour later her blood pressure was 154/84 pulse was 82.   Pt stated that she has not been taken the Wellbutrin since she went to the ED because she thought it was elevating her blood pressure.

## 2019-10-15 NOTE — Patient Instructions (Signed)
Take 2.5 mg amlodipine and 50 mg losartan every night after dinner  Check bp once daily in the mornings.    Send me readings next Wednesday morning (from Sunday to Wednesday)   GET A PILL BOX TO MAKE YOUR LIFE EASIER

## 2019-10-15 NOTE — Progress Notes (Signed)
Virtual Visit via [facility]   This visit type was conducted due to national recommendations for restrictions regarding the COVID-19 pandemic (e.g. social distancing).  This format is felt to be most appropriate for this patient at this time.  All issues noted in this document were discussed and addressed.  No physical exam was performed (except for noted visual exam findings with Video Visits).   I connected with@ on 10/15/19 at 12:30 PM EDT by a video enabled telemedicine application and verified that I am speaking with the correct person using two identifiers. Location patient: home Location provider: work or home office Persons participating in the virtual visit: patient, provider  I discussed the limitations, risks, security and privacy concerns of performing an evaluation and management service by telephone and the availability of in person appointments. I also discussed with the patient that there may be a patient responsible charge related to this service. The patient expressed understanding and agreed to proceed.  Reason for visit: ER Follow up  HPI:  79 yr old female with history of HTN, depression presents for ER follow up for symptomatic hypertension. Patient has been complaint with medication regimen,  Takes losartan 100 mg daily andchecks BP on a daily basis .  On March 30, BP was 197/107 with recurrent episodes of light headedness that been recurring several times daily for the past several days.  She was advised to go to the ER.  She hd also reported several episodes of indigestion so AMI was ruled out with EKG and serial troponin enzymes.  Given 5 mg amlodipine for prn use and discharged from ER     She note an elevated reading  this morning when she woke up but pt stated that she does not know if she took her blood pressure medication last night. Does not use a pill box.  Increased stressors as the primary caregiver (the only caregiver) for her adult son who is not financially or  emotionally independent due to bipolar disorder, and her husband who was left with a right sided hemiplegia after a left brain CVA 2 years ago.   Pt took the amlodipine at 9:15am this morning f, or BP of 165/84 and about an hour later her blood pressure was 154/84 pulse was 82.   Pt stated that she has not been taken the Wellbutrin since she went to the ED because she thought it was elevating her blood pressure. However, she has been taking the medication reguularly for over 3 years     ROS: See pertinent positives and negatives per HPI.  Past Medical History:  Diagnosis Date  . Hyperlipidemia   . Hypertension     Past Surgical History:  Procedure Laterality Date  . BREAST EXCISIONAL BIOPSY Left 1988   benign  . BREAST SURGERY Left 1986   for false positive/biopsy  . EYE SURGERY Left 6wks ago   cataract  . FRACTURE SURGERY Left 01/14/2011   ankle  . TONSILECTOMY, ADENOIDECTOMY, BILATERAL MYRINGOTOMY AND TUBES Bilateral 1957   does not believe adenoids were removed  . TUBAL LIGATION  y-37    Family History  Problem Relation Age of Onset  . Hyperlipidemia Mother   . Cancer Mother   . Arthritis Father   . Alzheimer's disease Father   . Colon cancer Paternal Aunt     SOCIAL HX:  reports that she quit smoking about 40 years ago. Her smoking use included cigarettes. She smoked 2.00 packs per day. She has never used smokeless tobacco. She  reports current alcohol use of about 5.0 standard drinks of alcohol per week. She reports that she does not use drugs.   Current Outpatient Medications:  .  ALPRAZolam (XANAX) 0.25 MG tablet, Take 1 tablet (0.25 mg total) by mouth daily as needed for anxiety., Disp: 20 tablet, Rfl: 0 .  amLODipine (NORVASC) 5 MG tablet, Take 1 tablet (5 mg total) by mouth daily as needed., Disp: 12 tablet, Rfl: 0 .  atorvastatin (LIPITOR) 20 MG tablet, Take 1 tablet (20 mg total) by mouth daily., Disp: 90 tablet, Rfl: 1 .  buPROPion (WELLBUTRIN SR) 100 MG 12 hr  tablet, Take 1 tablet (100 mg total) by mouth 2 (two) times daily., Disp: 60 tablet, Rfl: 2 .  calcium carbonate (TUMS - DOSED IN MG ELEMENTAL CALCIUM) 500 MG chewable tablet, Chew 1 tablet (200 mg of elemental calcium total) by mouth 3 (three) times daily as needed for indigestion or heartburn., Disp: 30 tablet, Rfl: 0 .  Cholecalciferol (VITAMIN D3) 1000 UNITS CAPS, Take 1,000 Units by mouth daily., Disp: , Rfl:  .  famotidine (PEPCID) 20 MG tablet, Take 20 mg by mouth 2 (two) times daily., Disp: , Rfl:  .  losartan (COZAAR) 50 MG tablet, Take 1 tablet (50 mg total) by mouth daily., Disp: 90 tablet, Rfl: 1 .  valACYclovir (VALTREX) 1000 MG tablet, Take 2 tablets (2,000 mg total) by mouth 2 (two) times daily., Disp: 20 tablet, Rfl: 0  Current Facility-Administered Medications:  .  cyanocobalamin ((VITAMIN B-12)) injection 1,000 mcg, 1,000 mcg, Intramuscular, Once, Derrel Nip, Aris Everts, MD  EXAM:  VITALS per patient if applicable:  GENERAL: alert, oriented, appears well and in no acute distress  HEENT: atraumatic, conjunttiva clear, no obvious abnormalities on inspection of external nose and ears  NECK: normal movements of the head and neck  LUNGS: on inspection no signs of respiratory distress, breathing rate appears normal, no obvious gross SOB, gasping or wheezing  CV: no obvious cyanosis  MS: moves all visible extremities without noticeable abnormality  PSYCH/NEURO: pleasant and cooperative, no obvious depression or anxiety, speech and thought processing grossly intact  ASSESSMENT AND PLAN:  Discussed the following assessment and plan:  Essential hypertension, benign  Depressive disorder  Essential hypertension, benign With recent episode of accelerated HTN with light headedness and chest pain treated in ED.  Records reviewed with patient.  AMI ruled out,  Chest pain history more consistent with GERD .  Suspect that her elevated reading was due to medication lapse, so I have  advised to use a pill box to ensure regular administration of medications.  Adding 2.5 mg amlodipine daily to current regimen of losartan 100 mg .  recheck in  one week  Depressive disorder Advised to resume wellbutrin     I discussed the assessment and treatment plan with the patient. The patient was provided an opportunity to ask questions and all were answered. The patient agreed with the plan and demonstrated an understanding of the instructions.   The patient was advised to call back or seek an in-person evaluation if the symptoms worsen or if the condition fails to improve as anticipated.  I provided 30 minutes of non-face-to-face time during this encounter.   Crecencio Mc, MD

## 2019-10-18 NOTE — Assessment & Plan Note (Addendum)
With recent episode of accelerated HTN with light headedness and chest pain treated in ED.  Records reviewed with patient.  AMI ruled out,  Chest pain history more consistent with GERD .  Suspect that her elevated reading was due to medication lapse, so I have advised to use a pill box to ensure regular administration of medications.  Adding 2.5 mg amlodipine daily to current regimen of losartan 100 mg .  recheck in  one week

## 2019-10-18 NOTE — Assessment & Plan Note (Signed)
Advised to resume wellbutrin

## 2019-10-20 ENCOUNTER — Ambulatory Visit (INDEPENDENT_AMBULATORY_CARE_PROVIDER_SITE_OTHER): Payer: Medicare Other

## 2019-10-20 ENCOUNTER — Other Ambulatory Visit: Payer: Self-pay

## 2019-10-20 VITALS — BP 139/85 | HR 86 | Ht 63.0 in | Wt 184.0 lb

## 2019-10-20 DIAGNOSIS — Z0000 Encounter for general adult medical examination without abnormal findings: Secondary | ICD-10-CM

## 2019-10-20 NOTE — Progress Notes (Addendum)
Subjective:   Kara Harvey is a 79 y.o. female who presents for Medicare Annual (Subsequent) preventive examination.  Review of Systems:  No ROS.  Medicare Wellness Virtual Visit.  Visual/audio telehealth visit. Vitals provided by patient.  See social history for additional risk factors.  Home BP readings: (10/19/19) am 154/90 P82, pm 153/86 P76  Cardiac Risk Factors include: advanced age (>85men, >83 women);hypertension     Objective:     Vitals: BP 139/85 (BP Location: Right Arm, Patient Position: Sitting, Cuff Size: Normal)   Pulse 86   Ht 5\' 3"  (1.6 m)   Wt 184 lb (83.5 kg)   BMI 32.59 kg/m   Body mass index is 32.59 kg/m.  Advanced Directives 10/20/2019 10/13/2019 07/13/2017 07/13/2017  Does Patient Have a Medical Advance Directive? Yes Yes No No  Type of Advance Directive - Healthcare Power of [place];Living will - -  Does patient want to make changes to medical advance directive? No - Patient declined No - Patient declined - -  Copy of [place] in Chart? No - copy requested No - copy requested - -  Would patient like information on creating a medical advance directive? - No - Patient declined No - Patient declined No - Patient declined    Tobacco Social History   Tobacco Use  Smoking Status Former Smoker  . Packs/day: 2.00  . Types: Cigarettes  . Quit date: 03/28/1979  . Years since quitting: 40.5  Smokeless Tobacco Never Used     Counseling given: Not Answered   Clinical Intake:  Pre-visit preparation completed: Yes        Diabetes: No  How often do you need to have someone help you when you read instructions, pamphlets, or other written materials from your doctor or pharmacy?: 1 - Never  Interpreter Needed?: No     Past Medical History:  Diagnosis Date  . Hyperlipidemia   . Hypertension    Past Surgical History:  Procedure Laterality Date  . BREAST EXCISIONAL BIOPSY Left 1988   benign  . BREAST SURGERY Left 1986    for false positive/biopsy  . EYE SURGERY Left 6wks ago   cataract  . FRACTURE SURGERY Left 01/14/2011   ankle  . TONSILECTOMY, ADENOIDECTOMY, BILATERAL MYRINGOTOMY AND TUBES Bilateral 1957   does not believe adenoids were removed  . TUBAL LIGATION  y-37   Family History  Problem Relation Age of Onset  . Hyperlipidemia Mother   . Cancer Mother   . Arthritis Father   . Alzheimer's disease Father   . Colon cancer Paternal Aunt    Social History   Socioeconomic History  . Marital status: Married    Spouse name: Not on file  . Number of children: Not on file  . Years of education: Not on file  . Highest education level: Not on file  Occupational History  . Not on file  Tobacco Use  . Smoking status: Former Smoker    Packs/day: 2.00    Types: Cigarettes    Quit date: 03/28/1979    Years since quitting: 40.5  . Smokeless tobacco: Never Used  Substance and Sexual Activity  . Alcohol use: Yes    Alcohol/week: 5.0 standard drinks    Types: 5 Glasses of wine per week  . Drug use: No  . Sexual activity: Yes  Other Topics Concern  . Not on file  Social History Narrative  . Not on file   Social Determinants of Health   Financial  Resource Strain: Low Risk   . Difficulty of Paying Living Expenses: Not hard at all  Food Insecurity: No Food Insecurity  . Worried About Charity fundraiser in the Last Year: Never true  . Ran Out of Food in the Last Year: Never true  Transportation Needs: No Transportation Needs  . Lack of Transportation (Medical): No  . Lack of Transportation (Non-Medical): No  Physical Activity:   . Days of Exercise per Week:   . Minutes of Exercise per Session:   Stress: No Stress Concern Present  . Feeling of Stress : Not at all  Social Connections: Unknown  . Frequency of Communication with Friends and Family: More than three times a week  . Frequency of Social Gatherings with Friends and Family: Once a week  . Attends Religious Services: Not on file  .  Active Member of Clubs or Organizations: Not on file  . Attends Archivist Meetings: Not on file  . Marital Status: Married    Outpatient Encounter Medications as of 10/20/2019  Medication Sig  . ALPRAZolam (XANAX) 0.25 MG tablet Take 1 tablet (0.25 mg total) by mouth daily as needed for anxiety.  Marland Kitchen amLODipine (NORVASC) 5 MG tablet Take 1 tablet (5 mg total) by mouth daily as needed.  Marland Kitchen atorvastatin (LIPITOR) 20 MG tablet Take 1 tablet (20 mg total) by mouth daily.  Marland Kitchen buPROPion (WELLBUTRIN SR) 100 MG 12 hr tablet Take 1 tablet (100 mg total) by mouth 2 (two) times daily.  . calcium carbonate (TUMS - DOSED IN MG ELEMENTAL CALCIUM) 500 MG chewable tablet Chew 1 tablet (200 mg of elemental calcium total) by mouth 3 (three) times daily as needed for indigestion or heartburn.  . Cholecalciferol (VITAMIN D3) 1000 UNITS CAPS Take 1,000 Units by mouth daily.  . famotidine (PEPCID) 20 MG tablet Take 20 mg by mouth 2 (two) times daily.  Marland Kitchen losartan (COZAAR) 50 MG tablet Take 1 tablet (50 mg total) by mouth daily.  . valACYclovir (VALTREX) 1000 MG tablet Take 2 tablets (2,000 mg total) by mouth 2 (two) times daily.   Facility-Administered Encounter Medications as of 10/20/2019  Medication  . cyanocobalamin ((VITAMIN B-12)) injection 1,000 mcg    Activities of Daily Living In your present state of health, do you have any difficulty performing the following activities: 10/20/2019  Hearing? N  Vision? N  Difficulty concentrating or making decisions? N  Walking or climbing stairs? N  Dressing or bathing? N  Doing errands, shopping? N  Preparing Food and eating ? N  Using the Toilet? N  In the past six months, have you accidently leaked urine? N  Do you have problems with loss of bowel control? N  Managing your Medications? N  Managing your Finances? N  Housekeeping or managing your Housekeeping? N  Some recent data might be hidden    Patient Care Team: Crecencio Mc, MD as PCP -  General (Internal Medicine)    Assessment:   This is a routine wellness examination for Kara Harvey.  Nurse connected with patient 10/20/19 at  1:30 PM EDT by a telephone enabled telemedicine application and verified that I am speaking with the correct person using two identifiers. Patient stated full name and DOB. Patient gave permission to continue with virtual visit. Patient's location was at home and Nurse's location was at [place] office.   Patient is alert and oriented x3. Patient denies difficulty focusing or concentrating. Patient likes to read, use her ipad and stays socially connected  for brain health.   Health Maintenance Due: -Colonoscopy- deferred per patient; plans to complete later in the season.  See completed HM at the end of note.   Eye: Visual acuity not assessed. Virtual visit. Followed by their ophthalmologist.  Dental: Visits every 6 months.    Hearing: Demonstrates normal hearing during visit.  Safety:  Patient feels safe at home- yes Patient does have smoke detectors at home- yes Patient does wear sunscreen or protective clothing when in direct sunlight - yes Patient does wear seat belt when in a moving vehicle - yes Patient drives- yes Adequate lighting in walkways free from debris- yes Grab bars and handrails used as appropriate- yes Ambulates with an assistive device- no Cell phone on person when ambulating outside of the home-yes  Social: Alcohol intake - yes      Smoking history- former  Smokers in home? none Illicit drug use? none  Medication: Taking as directed and without issues.  Pill box in use -no Self managed - yes   Covid-19: Precautions and sickness symptoms discussed. Wears mask, social distancing, hand hygiene as appropriate.   Activities of Daily Living Patient denies needing assistance with: household chores, feeding themselves, getting from bed to chair, getting to the toilet, bathing/showering, dressing, managing money, or  preparing meals.   Discussed the importance of a healthy diet, water intake and the benefits of aerobic exercise.   Physical activity- active around the home. Encouraged to walk daily, 30 minutes.   Diet:  Low sodium Water: good intake  Caffeine: 1 cup of coffee daily  Other Providers Patient Care Team: Crecencio Mc, MD as PCP - General (Internal Medicine)  Exercise Activities and Dietary recommendations Current Exercise Habits: Home exercise routine  Goals      Patient Stated   . I need to walk daily, 30 minutes (pt-stated)       Fall Risk Fall Risk  10/20/2019 10/15/2019 09/30/2019 10/25/2017 10/24/2016  Falls in the past year? 0 0 0 No No  Number falls in past yr: - - - - -  Injury with Fall? - - - - -  Follow up Falls evaluation completed;Falls prevention discussed Falls evaluation completed Falls evaluation completed - -   Timed Get Up and Go performed: no, virtual visit  Depression Screen PHQ 2/9 Scores 10/20/2019 09/30/2019 04/28/2018 10/24/2016  PHQ - 2 Score 0 0 0 0  PHQ- 9 Score 0 4 4 -  Patient states she is sleeping better, feels better, has more energy and is taking wellbutrin as directed by physician.    Cognitive Function     6CIT Screen 10/20/2019  What Year? 0 points  What month? 0 points  What time? 0 points  Count back from 20 0 points  Months in reverse 0 points  Repeat phrase 0 points  Total Score 0    Immunization History  Administered Date(s) Administered  . Fluad Quad(high Dose 65+) 04/08/2019  . Influenza Split 04/20/2014  . Influenza, High Dose Seasonal PF 04/07/2018  . Influenza,inj,Quad PF,6+ Mos 03/27/2013  . Influenza-Unspecified 04/18/2015, 04/23/2016, 04/23/2017, 04/24/2017  . PFIZER SARS-COV-2 Vaccination 08/14/2019, 09/04/2019  . Pneumococcal Conjugate-13 09/03/2013  . Pneumococcal Polysaccharide-23 03/27/2010, 04/26/2017  . Tdap 09/14/2014  . Zoster 05/22/2013  . Zoster Recombinat (Shingrix) 02/14/2018, 04/25/2018   Screening  Tests Health Maintenance  Topic Date Due  . COLONOSCOPY  06/09/2018  . INFLUENZA VACCINE  02/14/2020  . TETANUS/TDAP  09/13/2024  . DEXA SCAN  Completed  . PNA vac Low  Risk Adult  Completed      Plan:    Keep all routine maintenance appointments.   Medicare Attestation I have personally reviewed: The patient's medical and social history Their use of alcohol, tobacco or illicit drugs Their current medications and supplements The patient's functional ability including ADLs,fall risks, home safety risks, cognitive, and hearing and visual impairment Diet and physical activities Evidence for depression   I have reviewed and discussed with patient certain preventive protocols, quality metrics, and best practice recommendations.      OBrien-Blaney, Jakyren Fluegge L, LPN  [id number]    I have reviewed the above information and agree with above.   Deborra Medina, MD

## 2019-10-20 NOTE — Patient Instructions (Addendum)
  Ms. Soja , Thank you for taking time to come for your Medicare Wellness Visit. I appreciate your ongoing commitment to your health goals. Please review the following plan we discussed and let me know if I can assist you in the future.   These are the goals we discussed: Goals      Patient Stated   . I need to walk daily, 30 minutes (pt-stated)       This is a list of the screening recommended for you and due dates:  Health Maintenance  Topic Date Due  . Colon Cancer Screening  06/09/2018  . Flu Shot  02/14/2020  . Tetanus Vaccine  09/13/2024  . DEXA scan (bone density measurement)  Completed  . Pneumonia vaccines  Completed

## 2019-10-21 ENCOUNTER — Telehealth: Payer: Self-pay | Admitting: Internal Medicine

## 2019-10-21 NOTE — Telephone Encounter (Signed)
That's almost perfect on 2.5 mg amlodipine. Continue current dose,  Continue checking daily for one week and send results

## 2019-10-21 NOTE — Telephone Encounter (Signed)
FYI

## 2019-10-21 NOTE — Telephone Encounter (Signed)
Pt's blood pressure reading 143/80 pulse 80

## 2019-10-22 NOTE — Telephone Encounter (Signed)
Spoke with pt and informed her that she needs to continue taking the 2.5 mg of amlodipine and keep checking her blood pressure once a day for a week, then call us with those results.

## 2019-10-29 NOTE — Telephone Encounter (Signed)
Pt called to give BP readings   4/9 149/81 4/10 134/80 4/11 153/31 4/12  169/87 4/13 158/82 4/14 150/80 4/15 165/86

## 2019-10-30 MED ORDER — AMLODIPINE BESYLATE 5 MG PO TABS: 5.0000 mg | ORAL_TABLET | Freq: Every day | ORAL | 0 refills | Status: DC

## 2019-10-30 NOTE — Addendum Note (Signed)
Addended by: Adair Laundry on: 10/30/2019 04:58 PM   Modules accepted: Orders

## 2019-10-30 NOTE — Telephone Encounter (Signed)
Spoke with pt and informed her that she needs to increase her amlodipine to 5 mg daily and continue to check bp once daily for a week, then call us back with the results. Pt gave a verbal understanding.

## 2019-10-30 NOTE — Telephone Encounter (Signed)
BP too  High..  Increase amlodipine to 5 mg daily .  Repeat instructions from last time

## 2019-11-06 ENCOUNTER — Other Ambulatory Visit: Payer: Self-pay | Admitting: Internal Medicine

## 2019-11-06 ENCOUNTER — Telehealth: Payer: Self-pay | Admitting: Internal Medicine

## 2019-11-06 MED ORDER — AMLODIPINE BESYLATE 10 MG PO TABS: 10.0000 mg | ORAL_TABLET | Freq: Every day | ORAL | 5 refills | Status: DC

## 2019-11-06 MED ORDER — LOSARTAN POTASSIUM 100 MG PO TABS: 100.0000 mg | ORAL_TABLET | Freq: Every day | ORAL | 1 refills | Status: DC

## 2019-11-06 NOTE — Telephone Encounter (Signed)
Spoke with pt and she sated that she has been taking both the losartan 50 mg once daily at bedtime and the Amlodipine 5mg  once daily at bedtime. Do you want pt to increase both the losartan to 100 mg and the amlodipine to 10 mg?

## 2019-11-06 NOTE — Telephone Encounter (Signed)
No.  She was supposed to already be on 100 mg losartan.  Make that increase and keep the amlodipine at 2.5 mg for now  Follow up in 1-2 weeks with readings

## 2019-11-06 NOTE — Telephone Encounter (Signed)
Spoke with pt and informed her of the medication changes and told pt that she needs to follow up in 1 to 2 weeks with bp readings again. Pt wrote directions down and gave a verbal understanding.

## 2019-11-06 NOTE — Telephone Encounter (Signed)
FYI

## 2019-11-06 NOTE — Telephone Encounter (Signed)
Patient called to give BP results; 4/16- 152/78,   4/17- 147/80 & 139/78,    4/18- 141/75,     4/19- 151/76,    4/20- 135/80,    4/21- 127/71,     4/22- 150/75,    4/23- 138/80.

## 2019-11-06 NOTE — Telephone Encounter (Signed)
BPs reviewed  ,and still too high.    Continue losartan 100 mg daily and increase amlodipine to 10 mg daily . Confirm that she is taking correct doses and taking one at night,  On in the morning

## 2019-11-09 ENCOUNTER — Telehealth: Payer: Self-pay

## 2019-11-09 MED ORDER — AMLODIPINE BESYLATE 2.5 MG PO TABS: 2.5000 mg | ORAL_TABLET | Freq: Every day | ORAL | 0 refills | Status: DC

## 2019-11-09 NOTE — Telephone Encounter (Signed)
Per Dr. Lupita Dawn last telephone encounter pt should be taking 2.5 mg of amlodipine. Pt is aware but stated that the only dose she had was the 10 mg. New rx has been sent in.

## 2019-11-13 ENCOUNTER — Telehealth: Payer: Self-pay | Admitting: Internal Medicine

## 2019-11-13 NOTE — Telephone Encounter (Signed)
Pt wanted to give bp results for the week  11-06-19 138/80 11-07-19 154/89 11-08-19 134/77 11-09-19 138/77 11-10-19 139/72 11-11-19 133/71 11-12-19 127/72 11-13-19 120/67

## 2019-11-16 NOTE — Telephone Encounter (Signed)
BP is better  Continue current regimen

## 2019-11-16 NOTE — Telephone Encounter (Signed)
Spoke with pt to let her know that her bp readings are better and that Dr. Derrel Nip would like for her to continue her current medication regimen. Pt gave a verbal understanding.

## 2019-11-17 ENCOUNTER — Other Ambulatory Visit: Payer: Self-pay

## 2019-11-17 ENCOUNTER — Other Ambulatory Visit (INDEPENDENT_AMBULATORY_CARE_PROVIDER_SITE_OTHER): Payer: Medicare Other

## 2019-11-17 DIAGNOSIS — E785 Hyperlipidemia, unspecified: Secondary | ICD-10-CM

## 2019-11-17 DIAGNOSIS — I10 Essential (primary) hypertension: Secondary | ICD-10-CM

## 2019-11-17 LAB — COMPREHENSIVE METABOLIC PANEL
ALT: 16 U/L (ref 0–35)
AST: 16 U/L (ref 0–37)
Albumin: 4.3 g/dL (ref 3.5–5.2)
Alkaline Phosphatase: 82 U/L (ref 39–117)
BUN: 22 mg/dL (ref 6–23)
CO2: 31 mEq/L (ref 19–32)
Calcium: 9.5 mg/dL (ref 8.4–10.5)
Chloride: 100 mEq/L (ref 96–112)
Creatinine, Ser: 0.74 mg/dL (ref 0.40–1.20)
GFR: 76.03 mL/min (ref >60.00)
Glucose, Bld: 101 mg/dL — ABNORMAL HIGH (ref 70–99)
Potassium: 4 mEq/L (ref 3.5–5.1)
Sodium: 136 mEq/L (ref 135–145)
Total Bilirubin: 0.6 mg/dL (ref 0.2–1.2)
Total Protein: 6.6 g/dL (ref 6.0–8.3)

## 2019-11-17 LAB — LIPID PANEL
Cholesterol: 156 mg/dL (ref 0–200)
HDL: 68.2 mg/dL (ref >39.00)
LDL Cholesterol: 76 mg/dL (ref 0–99)
NonHDL: 87.63
Total CHOL/HDL Ratio: 2
Triglycerides: 60 mg/dL (ref 0.0–149.0)
VLDL: 12 mg/dL (ref 0.0–40.0)

## 2019-11-18 NOTE — Progress Notes (Signed)
Your Cholesterol, , liver and kidney function are normal.  Please continue your current medications and plan to repeat non fasting labs in 6 months.   Regards,  Dr. Derrel Nip

## 2019-12-23 ENCOUNTER — Telehealth: Payer: Self-pay | Admitting: Internal Medicine

## 2019-12-23 MED ORDER — AMLODIPINE BESYLATE 2.5 MG PO TABS: 2.5000 mg | ORAL_TABLET | Freq: Every day | ORAL | 0 refills | Status: DC

## 2019-12-23 NOTE — Telephone Encounter (Signed)
BP results  12/15/19-- 128/61  12/18/19 --125/59  12/22/19 --127/63  12/23/19 --129/76  Numbers were similar in May.

## 2019-12-23 NOTE — Telephone Encounter (Signed)
Spoke with pt to let her know that her blood pressure readings are good and that she should continue taking her current bp meds. Pt gave a verbal understanding.

## 2019-12-23 NOTE — Telephone Encounter (Signed)
Pt needs refill on amLODipine (NORVASC) 2.5 MG tablet. She only has one pill left

## 2019-12-23 NOTE — Telephone Encounter (Signed)
Well controlled on current regimen. contiue current  medications ,  no changes today. Follow up 6 months from last visit

## 2020-01-25 ENCOUNTER — Telehealth: Payer: Self-pay | Admitting: Internal Medicine

## 2020-01-25 MED ORDER — AMLODIPINE BESYLATE 2.5 MG PO TABS: 2.5000 mg | ORAL_TABLET | Freq: Every day | ORAL | 0 refills | Status: DC

## 2020-01-25 NOTE — Telephone Encounter (Signed)
Pt needs a refill on amLODipine (NORVASC) 2.5 MG tablet sent to [facility]

## 2020-01-25 NOTE — Addendum Note (Signed)
Addended byElpidio Galea T on: 01/25/2020 01:15 PM   Modules accepted: Orders

## 2020-01-28 ENCOUNTER — Other Ambulatory Visit: Payer: Self-pay | Admitting: Internal Medicine

## 2020-01-28 ENCOUNTER — Other Ambulatory Visit: Payer: Self-pay

## 2020-01-28 MED ORDER — AMLODIPINE BESYLATE 2.5 MG PO TABS: 2.5000 mg | ORAL_TABLET | Freq: Every day | ORAL | 0 refills | Status: DC

## 2020-02-24 ENCOUNTER — Other Ambulatory Visit: Payer: Self-pay | Admitting: Internal Medicine

## 2020-04-18 ENCOUNTER — Other Ambulatory Visit: Payer: Self-pay

## 2020-04-18 ENCOUNTER — Ambulatory Visit (INDEPENDENT_AMBULATORY_CARE_PROVIDER_SITE_OTHER): Payer: Medicare Other

## 2020-04-18 DIAGNOSIS — Z23 Encounter for immunization: Secondary | ICD-10-CM

## 2020-05-12 ENCOUNTER — Other Ambulatory Visit: Payer: Self-pay | Admitting: Internal Medicine

## 2020-06-24 ENCOUNTER — Other Ambulatory Visit: Payer: Self-pay

## 2020-06-24 ENCOUNTER — Ambulatory Visit (INDEPENDENT_AMBULATORY_CARE_PROVIDER_SITE_OTHER): Payer: Medicare Other | Admitting: Internal Medicine

## 2020-06-24 ENCOUNTER — Encounter: Payer: Self-pay | Admitting: Internal Medicine

## 2020-06-24 VITALS — BP 126/70 | HR 92 | Temp 98.1°F | Wt 190.2 lb

## 2020-06-24 DIAGNOSIS — E785 Hyperlipidemia, unspecified: Secondary | ICD-10-CM

## 2020-06-24 DIAGNOSIS — Z860100 Personal history of colon polyps, unspecified: Secondary | ICD-10-CM | POA: Insufficient documentation

## 2020-06-24 DIAGNOSIS — F411 Generalized anxiety disorder: Secondary | ICD-10-CM

## 2020-06-24 DIAGNOSIS — F430 Acute stress reaction: Secondary | ICD-10-CM

## 2020-06-24 DIAGNOSIS — I10 Essential (primary) hypertension: Secondary | ICD-10-CM | POA: Diagnosis not present

## 2020-06-24 DIAGNOSIS — F32A Depression, unspecified: Secondary | ICD-10-CM

## 2020-06-24 DIAGNOSIS — F329 Major depressive disorder, single episode, unspecified: Secondary | ICD-10-CM

## 2020-06-24 MED ORDER — ALPRAZOLAM 0.25 MG PO TABS: 0.2500 mg | ORAL_TABLET | Freq: Every day | ORAL | 1 refills | Status: DC | PRN

## 2020-06-24 MED ORDER — BUPROPION HCL ER (SR) 100 MG PO TB12: 100.0000 mg | ORAL_TABLET | Freq: Two times a day (BID) | ORAL | 5 refills | Status: DC

## 2020-06-24 NOTE — Progress Notes (Signed)
Subjective:  Patient ID: Kara Harvey, female    DOB: [birth year]  Age: 79 y.o. MRN: [id number]  CC: The primary encounter diagnosis was Hyperlipidemia LDL goal <130. Diagnoses of Personal history of colonic polyps, Anxiety as acute reaction to gross stress, Essential hypertension, benign, and Depressive disorder were also pertinent to this visit.  HPI on ele ANNELL CANTY presents for follow up on elevated blood pressure   This visit occurred during the SARS-CoV-2 public health emergency.  Safety protocols were in place, including screening questions prior to the visit, additional usage of staff PPE, and extensive cleaning of exam room while observing appropriate contact time as indicated for disinfecting solutions.   Hypertension:  Hypertension: patient checks blood pressure twice weekly at home.  Readings have been for the most part > 140/80 at rest . Patient is following a reduced salt diet most days and is taking medications as prescribed.  She has brought her Home BP machine  In for calibration,  And by direct comparison is measuring 20 pts higher than readings from our office machine .  Home readings 155 to 160 ,  Took 5 mg amlodipine recently .  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 amLODipine (NORVASC) 2.5 MG tablet TAKE 1 TABLET(2.5 MG) BY MOUTH DAILY 90 tablet 0  . atorvastatin (LIPITOR) 20 MG tablet TAKE 1 TABLET(20 MG) BY MOUTH DAILY 90 tablet 1  . calcium carbonate (TUMS - DOSED IN MG ELEMENTAL CALCIUM) 500 MG chewable tablet Chew 1 tablet (200 mg of elemental calcium total) by mouth 3 (three) times daily as needed for indigestion or heartburn. 30 tablet 0  .  Cholecalciferol (VITAMIN D3) 1000 UNITS CAPS Take 1,000 Units by mouth daily.    . famotidine (PEPCID) 20 MG tablet Take 20 mg by mouth 2 (two) times daily.    Marland Kitchen losartan (COZAAR) 100 MG tablet TAKE 1 TABLET(100 MG) BY MOUTH DAILY 90 tablet 1  . valACYclovir (VALTREX) 1000 MG tablet Take 2 tablets (2,000 mg total) by mouth 2 (two) times daily. 20 tablet 0  . ALPRAZolam (XANAX) 0.25 MG tablet Take 1 tablet (0.25 mg total) by mouth daily as needed for anxiety. 20 tablet 0  . buPROPion (WELLBUTRIN SR) 100 MG 12 hr tablet Take 1 tablet (100 mg total) by mouth 2 (two) times daily. 60 tablet 2  . losartan (COZAAR) 50 MG tablet TAKE 1 TABLET(50 MG) BY MOUTH DAILY (Patient not taking: Reported on 06/24/2020) 90 tablet 1   Facility-Administered Medications Prior to Visit  Medication Dose Route Frequency Provider Last Rate Last Admin  . cyanocobalamin ((VITAMIN B-12)) injection 1,000 mcg  1,000 mcg Intramuscular Once Crecencio Mc, MD        Review of Systems;  Patient denies headache, fevers, malaise, unintentional weight loss, skin rash, eye pain, sinus congestion and sinus pain, sore throat, dysphagia,  hemoptysis , cough, dyspnea, wheezing, chest pain, palpitations, orthopnea, edema, abdominal pain, nausea, melena, diarrhea, constipation, flank pain, dysuria, hematuria, urinary  Frequency, nocturia, numbness, tingling, seizures,  Focal weakness, Loss of consciousness,  Tremor, insomnia, depression, anxiety, and suicidal ideation.      Objective:  BP 126/70   Pulse  92   Temp 98.1 F (36.7 C)   Wt 190 lb 3.2 oz (86.3 kg)   SpO2 98%   BMI 33.69 kg/m   BP Readings from Last 3 Encounters:  06/24/20 126/70  10/20/19 139/85  10/15/19 (!) 154/84    Wt Readings from Last 3 Encounters:  06/24/20 190 lb 3.2 oz (86.3 kg)  10/20/19 184 lb (83.5 kg)  10/15/19 184 lb (83.5 kg)    General appearance: alert, cooperative and appears stated age Ears: normal TM's and external ear canals both  ears Throat: lips, mucosa, and tongue normal; teeth and gums normal Neck: no adenopathy, no carotid bruit, supple, symmetrical, trachea midline and thyroid not enlarged, symmetric, no tenderness/mass/nodules Back: symmetric, no curvature. ROM normal. No CVA tenderness. Lungs: clear to auscultation bilaterally Heart: regular rate and rhythm, S1, S2 normal, no murmur, click, rub or gallop Abdomen: soft, non-tender; bowel sounds normal; no masses,  no organomegaly Pulses: 2+ and symmetric Skin: Skin color, texture, turgor normal. No rashes or lesions Lymph nodes: Cervical, supraclavicular, and axillary nodes normal.  No results found for: HGBA1C  Lab Results  Component Value Date   CREATININE 0.81 06/24/2020   CREATININE 0.74 11/17/2019   CREATININE 0.72 10/13/2019    Lab Results  Component Value Date   WBC 8.7 10/13/2019   HGB 14.1 10/13/2019   HCT 43.0 10/13/2019   PLT 278 10/13/2019   GLUCOSE 98 06/24/2020   CHOL 182 06/24/2020   TRIG 88 06/24/2020   HDL 84 06/24/2020   LDLDIRECT 135.1 09/03/2013   LDLCALC 81 06/24/2020   ALT 13 06/24/2020   AST 15 06/24/2020   NA 138 06/24/2020   K 4.3 06/24/2020   CL 100 06/24/2020   CREATININE 0.81 06/24/2020   BUN 25 06/24/2020   CO2 27 06/24/2020   TSH 2.29 06/24/2020   MICROALBUR 0.1 09/03/2013    DG Chest Portable 1 View  Result Date: 10/13/2019 CLINICAL DATA:  Hypertension. Evaluate for edema. Dizziness. EXAM: PORTABLE CHEST 1 VIEW COMPARISON:  Chest radiograph 01/28/2011 FINDINGS: Heart is normal in size. Normal mediastinal contours. Minimal vascular congestion without pulmonary edema. Minor streaky retrocardiac atelectasis. No confluent airspace disease. No pleural effusion or pneumothorax. IMPRESSION: Minimal vascular congestion without pulmonary edema. Electronically Signed   By: Keith Rake M.D.   On: 10/13/2019 13:32    Assessment & Plan:   Problem List Items Addressed This Visit      Unprioritized   Essential  hypertension, benign    Her home machine is overestimating her pressure by 20 pts.  Advised to continue 2.5 mg amlodipine daily and 100 mg losartan daily       Relevant Orders   Comprehensive metabolic panel (Completed)   Hyperlipidemia LDL goal <130 - Primary   Relevant Orders   Lipid panel (Completed)   TSH (Completed)   Depressive disorder    Her symptoms remain well  controlled on wellbutrin , and she is using alprazolam sparingly for insomnia .       Relevant Medications   ALPRAZolam (XANAX) 0.25 MG tablet   buPROPion (WELLBUTRIN SR) 100 MG 12 hr tablet   Anxiety as acute reaction to gross stress    She remains apprehensive about leaving [facility] (post CVA)  for more than 1-2 hours despite his ability to ambulate from room to room. She has frequent insomnia managed with  prn alprazolam. The risks and benefits of benzodiazepine use were reviewed with patient today including excessive sedation leading to respiratory depression,  impaired  thinking/driving, and addiction.  Patient was advised to avoid concurrent use with alcohol, to use medication only as needed and not to share with others  .       Relevant Medications   ALPRAZolam (XANAX) 0.25 MG tablet   buPROPion (WELLBUTRIN SR) 100 MG 12 hr tablet   Personal history of colonic polyps    2014.  Incomplete converted to virtual by [employer].  Continues to defer for now.         I provided  30 minutes of  face-to-face time during this encounter reviewing patient's current problems and past surgeries, labs and imaging studies, providing counseling on the above mentioned problems , and coordination  of care .  I am having Kara Harvey maintain her Vitamin D3, calcium carbonate, famotidine, valACYclovir, atorvastatin, amLODipine, losartan, ALPRAZolam, and buPROPion. We will continue to administer cyanocobalamin.  Meds ordered this encounter  Medications  . ALPRAZolam (XANAX) 0.25 MG tablet    Sig: Take 1 tablet (0.25 mg total)  by mouth daily as needed for anxiety.    Dispense:  20 tablet    Refill:  1  . buPROPion (WELLBUTRIN SR) 100 MG 12 hr tablet    Sig: Take 1 tablet (100 mg total) by mouth 2 (two) times daily.    Dispense:  60 tablet    Refill:  5    Medications Discontinued During This Encounter  Medication Reason  . losartan (COZAAR) 50 MG tablet Dose change  . ALPRAZolam (XANAX) 0.25 MG tablet Reorder  . buPROPion (WELLBUTRIN SR) 100 MG 12 hr tablet Reorder    Follow-up: No follow-ups on file.   Crecencio Mc, MD

## 2020-06-24 NOTE — Patient Instructions (Addendum)
Your home blood pressure  machine is reading 20 pts higher than our machine!  So subtract 20 pts from your readings     continue 2.5 mg amlodipine for now and 100 mg losartan   Goal is 537  To 482 systolic /  60 to 80 diastolic  Normal pulse  Is   80 to 95   I have refilled xanax,  wellbutrin

## 2020-06-24 NOTE — Assessment & Plan Note (Addendum)
She remains apprehensive about leaving [facility] (post CVA)  for more than 1-2 hours despite his ability to ambulate from room to room. She has frequent insomnia managed with  prn alprazolam. The risks and benefits of benzodiazepine use were reviewed with patient today including excessive sedation leading to respiratory depression,  impaired thinking/driving, and addiction.  Patient was advised to avoid concurrent use with alcohol, to use medication only as needed and not to share with others  .

## 2020-06-24 NOTE — Assessment & Plan Note (Signed)
2014.  Incomplete converted to virtual by [employer].  Continues to defer for now.

## 2020-06-25 LAB — COMPREHENSIVE METABOLIC PANEL
AG Ratio: 1.8 (calc) (ref 1.0–2.5)
ALT: 13 U/L (ref 6–29)
AST: 15 U/L (ref 10–35)
Albumin: 4.2 g/dL (ref 3.6–5.1)
Alkaline phosphatase (APISO): 76 U/L (ref 37–153)
BUN: 25 mg/dL (ref 7–25)
CO2: 27 mmol/L (ref 20–32)
Calcium: 9.7 mg/dL (ref 8.6–10.4)
Chloride: 100 mmol/L (ref 98–110)
Creat: 0.81 mg/dL (ref 0.60–0.93)
Globulin: 2.4 g/dL (calc) (ref 1.9–3.7)
Glucose, Bld: 98 mg/dL (ref 65–99)
Potassium: 4.3 mmol/L (ref 3.5–5.3)
Sodium: 138 mmol/L (ref 135–146)
Total Bilirubin: 0.4 mg/dL (ref 0.2–1.2)
Total Protein: 6.6 g/dL (ref 6.1–8.1)

## 2020-06-25 LAB — LIPID PANEL
Cholesterol: 182 mg/dL (ref <200)
HDL: 84 mg/dL (ref >=50)
LDL Cholesterol (Calc): 81 mg/dL (calc)
Non-HDL Cholesterol (Calc): 98 mg/dL (calc) (ref <130)
Total CHOL/HDL Ratio: 2.2 (calc) (ref <5.0)
Triglycerides: 88 mg/dL (ref <150)

## 2020-06-25 LAB — TSH: TSH: 2.29 mIU/L (ref 0.40–4.50)

## 2020-06-26 NOTE — Assessment & Plan Note (Signed)
Her home machine is overestimating her pressure by 20 pts.  Advised to continue 2.5 mg amlodipine daily and 100 mg losartan daily

## 2020-06-26 NOTE — Assessment & Plan Note (Signed)
Her symptoms remain well  controlled on wellbutrin , and she is using alprazolam sparingly for insomnia .  ?

## 2020-06-26 NOTE — Progress Notes (Signed)
Your thyroid ,cholesterol,  liver and kidney function are normal. Please Plan to repeat fasting labs in 6 year.   Regards,  Dr. Derrel Nip

## 2020-06-27 ENCOUNTER — Other Ambulatory Visit: Payer: Self-pay | Admitting: Internal Medicine

## 2020-06-28 ENCOUNTER — Telehealth: Payer: Self-pay | Admitting: Internal Medicine

## 2020-06-28 NOTE — Telephone Encounter (Signed)
Spoke with patient about lab results.

## 2020-06-28 NOTE — Telephone Encounter (Signed)
Patient was returning call for results 

## 2020-07-07 ENCOUNTER — Other Ambulatory Visit: Payer: Self-pay | Admitting: Internal Medicine

## 2020-07-07 DIAGNOSIS — Z1231 Encounter for screening mammogram for malignant neoplasm of breast: Secondary | ICD-10-CM

## 2020-07-29 ENCOUNTER — Encounter: Payer: Medicare Other | Admitting: Internal Medicine

## 2020-08-07 ENCOUNTER — Other Ambulatory Visit: Payer: Self-pay | Admitting: Internal Medicine

## 2020-08-16 ENCOUNTER — Ambulatory Visit: Payer: Medicare Other

## 2020-08-31 ENCOUNTER — Other Ambulatory Visit: Payer: Self-pay

## 2020-08-31 ENCOUNTER — Ambulatory Visit (INDEPENDENT_AMBULATORY_CARE_PROVIDER_SITE_OTHER): Payer: Medicare Other | Admitting: Internal Medicine

## 2020-08-31 ENCOUNTER — Encounter: Payer: Self-pay | Admitting: Internal Medicine

## 2020-08-31 VITALS — BP 136/72 | HR 79 | Temp 98.7°F | Resp 16 | Ht 61.5 in | Wt 189.2 lb

## 2020-08-31 DIAGNOSIS — I10 Essential (primary) hypertension: Secondary | ICD-10-CM | POA: Diagnosis not present

## 2020-08-31 DIAGNOSIS — E785 Hyperlipidemia, unspecified: Secondary | ICD-10-CM

## 2020-08-31 DIAGNOSIS — Z0000 Encounter for general adult medical examination without abnormal findings: Secondary | ICD-10-CM | POA: Diagnosis not present

## 2020-08-31 DIAGNOSIS — Z780 Asymptomatic menopausal state: Secondary | ICD-10-CM | POA: Diagnosis not present

## 2020-08-31 DIAGNOSIS — Z5320 Procedure and treatment not carried out because of patient's decision for unspecified reasons: Secondary | ICD-10-CM

## 2020-08-31 NOTE — Progress Notes (Signed)
Patient ID: Kara Harvey, female    DOB: [birth year]  Age: 80 y.o. MRN: [id number]  The patient is here for annual preventive examination and management of other chronic and acute problems.   The risk factors are reflected in the social history.  The roster of all physicians providing medical care to patient - is listed in the Snapshot section of the chart.  Activities of daily living:  The patient is 100% independent in all ADLs: dressing, toileting, feeding as well as independent mobility  Home safety : The patient has smoke detectors in the home. They wear seatbelts.  There are no firearms at home. There is no violence in the home.   There is no risks for hepatitis, STDs or HIV. There is no   history of blood transfusion. They have no travel history to infectious disease endemic areas of the world.  The patient has seen their dentist in the last six month. They have seen their eye doctor in the last year. They admit to slight hearing difficulty with regard to whispered voices and some television programs.  They have deferred audiologic testing in the last year.  They do not  have excessive sun exposure. Discussed the need for sun protection: hats, long sleeves and use of sunscreen if there is significant sun exposure.   Diet: the importance of a healthy diet is discussed. They do have a healthy diet.  The benefits of regular aerobic exercise were discussed. She is not walking regularly .   Depression screen: there are no signs or vegative symptoms of depression- irritability, change in appetite, anhedonia, sadness/tearfullness.  Cognitive assessment: the patient manages all their financial and personal affairs and is actively engaged. They could relate day,date,year and events; recalled 2/3 objects at 3 minutes; performed clock-face test normally.  The following portions of the patient's history were reviewed and updated as appropriate: allergies, current medications, past family history,  past medical history,  past surgical history, past social history  and problem list.  Visual acuity was not assessed per patient preference since she has regular follow up with her ophthalmologist. Hearing and body mass index were assessed and reviewed.   During the course of the visit the patient was educated and counseled about appropriate screening and preventive services including : fall prevention , diabetes screening, nutrition counseling, colorectal cancer screening, and recommended immunizations.    CC: The primary encounter diagnosis was Essential hypertension, benign. Diagnoses of Screening for hepatitis C declined, Postmenopausal estrogen deficiency, Hyperlipidemia LDL goal <130, and Encounter for preventive health examination were also pertinent to this visit.   Caregiver stress/fatigue:  Husband had a disabling  CVA 2 years ago ,, adult son lives with them ,  has bipolar d/o with frequent psychotic episodes ,  None since husband had a CVA . Son has decided not to have the quarterly IM injection that is very painful to receive so she is preparing for another episode    History Kara Harvey has a past medical history of Hyperlipidemia and Hypertension.   She has a past surgical history that includes Fracture surgery (Left, 01/14/2011); Tonsilectomy, adenoidectomy, bilateral myringotomy and tubes (Bilateral, 1957); Tubal ligation (y-37); Breast surgery (Left, 1986); Eye surgery (Left, 6wks ago); and Breast excisional biopsy (Left, 1988).   Her family history includes Alzheimer's disease in her father; Arthritis in her father; Cancer in her mother; Colon cancer in her paternal aunt; Hyperlipidemia in her mother.She reports that she quit smoking about 41 years ago. Her smoking use included  cigarettes. She smoked 2.00 packs per day. She has never used smokeless tobacco. She reports current alcohol use of about 5.0 standard drinks of alcohol per week. She reports that she does not use  drugs.  Outpatient Medications Prior to Visit  Medication Sig Dispense Refill  . ALPRAZolam (XANAX) 0.25 MG tablet Take 1 tablet (0.25 mg total) by mouth daily as needed for anxiety. 20 tablet 1  . amLODipine (NORVASC) 2.5 MG tablet TAKE 1 TABLET(2.5 MG) BY MOUTH DAILY 90 tablet 0  . atorvastatin (LIPITOR) 20 MG tablet TAKE 1 TABLET(20 MG) BY MOUTH DAILY 90 tablet 1  . buPROPion (WELLBUTRIN SR) 100 MG 12 hr tablet Take 1 tablet (100 mg total) by mouth 2 (two) times daily. 60 tablet 5  . calcium carbonate (TUMS - DOSED IN MG ELEMENTAL CALCIUM) 500 MG chewable tablet Chew 1 tablet (200 mg of elemental calcium total) by mouth 3 (three) times daily as needed for indigestion or heartburn. 30 tablet 0  . Cholecalciferol (VITAMIN D3) 1000 UNITS CAPS Take 1,000 Units by mouth daily.    . famotidine (PEPCID) 20 MG tablet Take 20 mg by mouth 2 (two) times daily.    Marland Kitchen losartan (COZAAR) 100 MG tablet TAKE 1 TABLET(100 MG) BY MOUTH DAILY 90 tablet 1  . valACYclovir (VALTREX) 1000 MG tablet TAKE 2 TABLETS(2000 MG) BY MOUTH TWICE DAILY 20 tablet 0   Facility-Administered Medications Prior to Visit  Medication Dose Route Frequency Provider Last Rate Last Admin  . cyanocobalamin ((VITAMIN B-12)) injection 1,000 mcg  1,000 mcg Intramuscular Once Crecencio Mc, MD        Review of Systems   Patient denies headache, fevers, malaise, unintentional weight loss, skin rash, eye pain, sinus congestion and sinus pain, sore throat, dysphagia,  hemoptysis , cough, dyspnea, wheezing, chest pain, palpitations, orthopnea, edema, abdominal pain, nausea, melena, diarrhea, constipation, flank pain, dysuria, hematuria, urinary  Frequency, nocturia, numbness, tingling, seizures,  Focal weakness, Loss of consciousness,  Tremor, insomnia, depression, anxiety, and suicidal ideation.      Objective:  BP 136/72 (BP Location: Left Arm, Patient Position: Sitting, Cuff Size: Normal)   Pulse 79   Temp 98.7 F (37.1 C) (Oral)    Resp 16   Ht 5' 1.5" (1.562 m)   Wt 189 lb 3.2 oz (85.8 kg)   SpO2 98%   BMI 35.17 kg/m   Physical Exam  General appearance: alert, cooperative and appears stated age Head: Normocephalic, without obvious abnormality, atraumatic Eyes: conjunctivae/corneas clear. PERRL, EOM's intact. Fundi benign. Ears: normal TM's and external ear canals both ears Nose: Nares normal. Septum midline. Mucosa normal. No drainage or sinus tenderness. Throat: lips, mucosa, and tongue normal; teeth and gums normal Neck: no adenopathy, no carotid bruit, no JVD, supple, symmetrical, trachea midline and thyroid not enlarged, symmetric, no tenderness/mass/nodules Lungs: clear to auscultation bilaterally Breasts: normal appearance, no masses or tenderness Heart: regular rate and rhythm, S1, S2 normal, no murmur, click, rub or gallop Abdomen: soft, non-tender; bowel sounds normal; no masses,  no organomegaly Extremities: extremities normal, atraumatic, no cyanosis or edema Pulses: 2+ and symmetric Skin: Skin color, texture, turgor normal. No rashes or lesions Neurologic: Alert and oriented X 3, normal strength and tone. Normal symmetric reflexes. Normal coordination and gait.    Assessment & Plan:   Problem List Items Addressed This Visit      Unprioritized   Encounter for preventive health examination    age appropriate education and counseling updated, referrals for preventative services and  immunizations addressed, dietary and smoking counseling addressed, most recent labs reviewed.  I have personally reviewed and have noted:  1) the patient's medical and social history 2) The pt's use of alcohol, tobacco, and illicit drugs 3) The patient's current medications and supplements 4) Functional ability including ADL's, fall risk, home safety risk, hearing and visual impairment 5) Diet and physical activities 6) Evidence for depression or mood disorder 7) The patient's height, weight, and BMI have been  recorded in the chart  I have made referrals, and provided counseling and education based on review of the above      Essential hypertension, benign - Primary    Well controlled on current  Of amlodipine and losartan . Renal function stable, no changes today.  Lab Results  Component Value Date   CREATININE 0.81 06/24/2020   Lab Results  Component Value Date   NA 138 06/24/2020   K 4.3 06/24/2020   CL 100 06/24/2020   CO2 27 06/24/2020         Relevant Orders   Comprehensive metabolic panel   Hyperlipidemia LDL goal <130    Managed with atorvastatin . Fasting lipids are at goal and LFTs are normal.  No changes today    Lab Results  Component Value Date   CHOL 182 06/24/2020   HDL 84 06/24/2020   LDLCALC 81 06/24/2020   LDLDIRECT 135.1 09/03/2013   TRIG 88 06/24/2020   CHOLHDL 2.2 06/24/2020   Lab Results  Component Value Date   ALT 13 06/24/2020   AST 15 06/24/2020   ALKPHOS 82 11/17/2019   BILITOT 0.4 06/24/2020          Other Visit Diagnoses    Screening for hepatitis C declined       Relevant Orders   Hepatitis C antibody   Postmenopausal estrogen deficiency       Relevant Orders   DG Bone Density      I am having Kara Harvey maintain her Vitamin D3, calcium carbonate, famotidine, amLODipine, losartan, ALPRAZolam, buPROPion, valACYclovir, and atorvastatin. We will continue to administer cyanocobalamin.  No orders of the defined types were placed in this encounter.   There are no discontinued medications.  Follow-up: No follow-ups on file.   Crecencio Mc, MD

## 2020-08-31 NOTE — Patient Instructions (Signed)
Return in June for labs and follow up  I will order your bone density test now   Health Maintenance After Age 78 After age 78, you are at a higher risk for certain long-term diseases and infections as well as injuries from falls. Falls are a major cause of broken bones and head injuries in people who are older than age 52. Getting regular preventive care can help to keep you healthy and well. Preventive care includes getting regular testing and making lifestyle changes as recommended by your health care provider. Talk with your health care provider about:  Which screenings and tests you should have. A screening is a test that checks for a disease when you have no symptoms.  A diet and exercise plan that is right for you. What should I know about screenings and tests to prevent falls? Screening and testing are the best ways to find a health problem early. Early diagnosis and treatment give you the best chance of managing medical conditions that are common after age 44. Certain conditions and lifestyle choices may make you more likely to have a fall. Your health care provider may recommend:  Regular vision checks. Poor vision and conditions such as cataracts can make you more likely to have a fall. If you wear glasses, make sure to get your prescription updated if your vision changes.  Medicine review. Work with your health care provider to regularly review all of the medicines you are taking, including over-the-counter medicines. Ask your health care provider about any side effects that may make you more likely to have a fall. Tell your health care provider if any medicines that you take make you feel dizzy or sleepy.  Osteoporosis screening. Osteoporosis is a condition that causes the bones to get weaker. This can make the bones weak and cause them to break more easily.  Blood pressure screening. Blood pressure changes and medicines to control blood pressure can make you feel dizzy.  Strength  and balance checks. Your health care provider may recommend certain tests to check your strength and balance while standing, walking, or changing positions.  Foot health exam. Foot pain and numbness, as well as not wearing proper footwear, can make you more likely to have a fall.  Depression screening. You may be more likely to have a fall if you have a fear of falling, feel emotionally low, or feel unable to do activities that you used to do.  Alcohol use screening. Using too much alcohol can affect your balance and may make you more likely to have a fall. What actions can I take to lower my risk of falls? General instructions  Talk with your health care provider about your risks for falling. Tell your health care provider if: ? You fall. Be sure to tell your health care provider about all falls, even ones that seem minor. ? You feel dizzy, sleepy, or off-balance.  Take over-the-counter and prescription medicines only as told by your health care provider. These include any supplements.  Eat a healthy diet and maintain a healthy weight. A healthy diet includes low-fat dairy products, low-fat (lean) meats, and fiber from whole grains, beans, and lots of fruits and vegetables. Home safety  Remove any tripping hazards, such as rugs, cords, and clutter.  Install safety equipment such as grab bars in bathrooms and safety rails on stairs.  Keep rooms and walkways well-lit. Activity  Follow a regular exercise program to stay fit. This will help you maintain your balance. Ask your  health care provider what types of exercise are appropriate for you.  If you need a cane or walker, use it as recommended by your health care provider.  Wear supportive shoes that have nonskid soles.   Lifestyle  Do not drink alcohol if your health care provider tells you not to drink.  If you drink alcohol, limit how much you have: ? 0-1 drink a day for women. ? 0-2 drinks a day for men.  Be aware of how  much alcohol is in your drink. In the [place] one drink equals one typical bottle of beer (12 oz), one-half glass of wine (5 oz), or one shot of hard liquor (1 oz).  Do not use any products that contain nicotine or tobacco, such as cigarettes and e-cigarettes. If you need help quitting, ask your health care provider. Summary  Having a healthy lifestyle and getting preventive care can help to protect your health and wellness after age 7.  Screening and testing are the best way to find a health problem early and help you avoid having a fall. Early diagnosis and treatment give you the best chance for managing medical conditions that are more common for people who are older than age 100.  Falls are a major cause of broken bones and head injuries in people who are older than age 57. Take precautions to prevent a fall at home.  Work with your health care provider to learn what changes you can make to improve your health and wellness and to prevent falls. This information is not intended to replace advice given to you by your health care provider. Make sure you discuss any questions you have with your health care provider. Document Revised: 10/23/2018 Document Reviewed: 05/15/2017 Elsevier Patient Education  2021 [employer].

## 2020-09-01 ENCOUNTER — Encounter: Payer: Self-pay | Admitting: Internal Medicine

## 2020-09-01 DIAGNOSIS — Z0000 Encounter for general adult medical examination without abnormal findings: Secondary | ICD-10-CM | POA: Insufficient documentation

## 2020-09-01 NOTE — Assessment & Plan Note (Signed)
Well controlled on current  Of amlodipine and losartan . Renal function stable, no changes today. ? ?Lab Results  ?Component Value Date  ? CREATININE 0.81 06/24/2020  ? ?Lab Results  ?Component Value Date  ? NA 138 06/24/2020  ? K 4.3 06/24/2020  ? CL 100 06/24/2020  ? CO2 27 06/24/2020  ? ? ?

## 2020-09-01 NOTE — Assessment & Plan Note (Signed)
Managed with atorvastatin . Fasting lipids are at goal and LFTs are normal.  No changes today  ? ? ?Lab Results  ?Component Value Date  ? CHOL 182 06/24/2020  ? HDL 84 06/24/2020  ? LDLCALC 81 06/24/2020  ? LDLDIRECT 135.1 09/03/2013  ? TRIG 88 06/24/2020  ? CHOLHDL 2.2 06/24/2020  ? ?Lab Results  ?Component Value Date  ? ALT 13 06/24/2020  ? AST 15 06/24/2020  ? ALKPHOS 82 11/17/2019  ? BILITOT 0.4 06/24/2020  ? ? ?

## 2020-09-01 NOTE — Assessment & Plan Note (Signed)

## 2020-09-12 ENCOUNTER — Other Ambulatory Visit: Payer: Self-pay

## 2020-09-12 MED ORDER — AMLODIPINE BESYLATE 2.5 MG PO TABS: ORAL_TABLET | ORAL | 1 refills | Status: DC

## 2020-09-21 ENCOUNTER — Other Ambulatory Visit: Payer: Self-pay

## 2020-09-21 ENCOUNTER — Ambulatory Visit: Payer: Medicare Other | Admitting: Dermatology

## 2020-09-21 DIAGNOSIS — L821 Other seborrheic keratosis: Secondary | ICD-10-CM | POA: Diagnosis not present

## 2020-09-21 DIAGNOSIS — D1800 Hemangioma unspecified site: Secondary | ICD-10-CM | POA: Diagnosis not present

## 2020-09-21 DIAGNOSIS — D2271 Melanocytic nevi of right lower limb, including hip: Secondary | ICD-10-CM

## 2020-09-21 DIAGNOSIS — L814 Other melanin hyperpigmentation: Secondary | ICD-10-CM

## 2020-09-21 DIAGNOSIS — D2262 Melanocytic nevi of left upper limb, including shoulder: Secondary | ICD-10-CM

## 2020-09-21 DIAGNOSIS — D2261 Melanocytic nevi of right upper limb, including shoulder: Secondary | ICD-10-CM | POA: Diagnosis not present

## 2020-09-21 DIAGNOSIS — Z808 Family history of malignant neoplasm of other organs or systems: Secondary | ICD-10-CM

## 2020-09-21 DIAGNOSIS — D2272 Melanocytic nevi of left lower limb, including hip: Secondary | ICD-10-CM | POA: Diagnosis not present

## 2020-09-21 DIAGNOSIS — L578 Other skin changes due to chronic exposure to nonionizing radiation: Secondary | ICD-10-CM | POA: Diagnosis not present

## 2020-09-21 DIAGNOSIS — Z1283 Encounter for screening for malignant neoplasm of skin: Secondary | ICD-10-CM | POA: Diagnosis not present

## 2020-09-21 NOTE — Patient Instructions (Addendum)
Melanoma ABCDEs ° °Melanoma is the most dangerous type of skin cancer, and is the leading cause of death from skin disease.  You are more likely to develop melanoma if you: °Have light-colored skin, light-colored eyes, or red or blond hair °Spend a lot of time in the sun °Tan regularly, either outdoors or in a tanning bed °Have had blistering sunburns, especially during childhood °Have a close family member who has had a melanoma °Have atypical moles or large birthmarks ° °Early detection of melanoma is key since treatment is typically straightforward and cure rates are extremely high if we catch it early.  ° °The first sign of melanoma is often a change in a mole or a new dark spot.  The ABCDE system is a way of remembering the signs of melanoma. ° °A for asymmetry:  The two halves do not match. °B for border:  The edges of the growth are irregular. °C for color:  A mixture of colors are present instead of an even brown color. °D for diameter:  Melanomas are usually (but not always) greater than 6mm - the size of a pencil eraser. °E for evolution:  The spot keeps changing in size, shape, and color. ° °Please check your skin once per month between visits. You can use a small mirror in front and a large mirror behind you to keep an eye on the back side or your body.  ° °If you see any new or changing lesions before your next follow-up, please call to schedule a visit. ° °Please continue daily skin protection including broad spectrum sunscreen SPF 30+ to sun-exposed areas, reapplying every 2 hours as needed when you're outdoors.  ° °Staying in the shade or wearing long sleeves, sun glasses (UVA+UVB protection) and wide brim hats (4-inch brim around the entire circumference of the hat) are also recommended for sun protection.   ° ° °

## 2020-09-21 NOTE — Progress Notes (Signed)
   New Patient Visit  Subjective  Kara Harvey is a 80 y.o. female who presents for the following: New Patient (Initial Visit) (Patient here today as a new initial visit. Patient states father had history of skin cancer. She denies history of skin cancers. Patient states about 6 - 8 years a mole or spot on back around bra line. Patient states the area has came back but doesn't bother her. Patient would like to discuss treatment for blackheads. ).  Patient here for full body skin exam and skin cancer screening.  Objective  Well appearing patient in no apparent distress; mood and affect are within normal limits.  A full examination was performed including scalp, head, eyes, ears, nose, lips, neck, chest, axillae, abdomen, back, buttocks, bilateral upper extremities, bilateral lower extremities, hands, feet, fingers, toes, fingernails, and toenails. All findings within normal limits unless otherwise noted below.   Assessment & Plan    Lentigines Bilateral arms  - Scattered tan macules - Due to sun exposure - Benign-appering, observe - Recommend daily broad spectrum sunscreen SPF 30+ to sun-exposed areas, reapply every 2 hours as needed. - Call for any changes  Seborrheic Keratoses Bilateral arms, bilateral legs, face, mid back - Stuck-on, waxy, tan-brown papules and plaques  - Discussed benign etiology and prognosis. - Observe - Call for any changes  Melanocytic Nevi Arms, bilateral legs   - Tan-brown and/or pink-flesh-colored symmetric macules and papules - Benign appearing on exam today - Observation - Call clinic for new or changing moles - Recommend daily use of broad spectrum spf 30+ sunscreen to sun-exposed areas.   Hemangiomas Arms, bilateral legs - Red papules - Discussed benign nature - Observe - Call for any changes  Actinic Damage - Chronic, secondary to cumulative UV/sun exposure - diffuse scaly erythematous macules with underlying dyspigmentation -  Recommend daily broad spectrum sunscreen SPF 30+ to sun-exposed areas, reapply every 2 hours as needed.  - Call for new or changing lesions.  Skin cancer screening performed today.  Return in about 1 year (around 09/21/2021) for tbse.  I, Ruthell Rummage, CMA, am acting as scribe for Forest Gleason, MD.   Documentation: I have reviewed the above documentation for accuracy and completeness, and I agree with the above.  Forest Gleason, MD

## 2020-09-26 ENCOUNTER — Other Ambulatory Visit: Payer: Self-pay

## 2020-09-26 ENCOUNTER — Encounter: Payer: Self-pay | Admitting: Dermatology

## 2020-09-26 ENCOUNTER — Ambulatory Visit
Admission: RE | Admit: 2020-09-26 | Discharge: 2020-09-26 | Disposition: A | Discharge status: Home / Self Care | Payer: Medicare Other | Source: Ambulatory Visit | Attending: Internal Medicine | Admitting: Internal Medicine

## 2020-09-26 DIAGNOSIS — Z1231 Encounter for screening mammogram for malignant neoplasm of breast: Secondary | ICD-10-CM

## 2020-10-20 ENCOUNTER — Ambulatory Visit (INDEPENDENT_AMBULATORY_CARE_PROVIDER_SITE_OTHER): Payer: Medicare Other

## 2020-10-20 VITALS — Ht 61.5 in | Wt 189.0 lb

## 2020-10-20 DIAGNOSIS — Z0000 Encounter for general adult medical examination without abnormal findings: Secondary | ICD-10-CM | POA: Diagnosis not present

## 2020-10-20 NOTE — Progress Notes (Signed)
Subjective:   TESSA SEABERRY is a 80 y.o. female who presents for Medicare Annual (Subsequent) preventive examination.  Review of Systems    No ROS.  Medicare Wellness Virtual Visit.   Cardiac Risk Factors include: advanced age (>42men, >59 women)     Objective:    Today's Vitals   10/20/20 1335  Weight: 189 lb (85.7 kg)  Height: 5' 1.5" (1.562 m)   Body mass index is 35.13 kg/m.  Advanced Directives 10/20/2020 10/20/2019 10/13/2019 07/13/2017 07/13/2017  Does Patient Have a Medical Advance Directive? Yes Yes Yes No No  Type of Paramedic of [place];Living will - [place];Living will - -  Does patient want to make changes to medical advance directive? No - Patient declined No - Patient declined No - Patient declined - -  Copy of [place] in Chart? No - copy requested No - copy requested No - copy requested - -  Would patient like information on creating a medical advance directive? - - No - Patient declined No - Patient declined No - Patient declined    Current Medications (verified) Outpatient Encounter Medications as of 10/20/2020  Medication Sig  . ALPRAZolam (XANAX) 0.25 MG tablet Take 1 tablet (0.25 mg total) by mouth daily as needed for anxiety.  Marland Kitchen amLODipine (NORVASC) 2.5 MG tablet TAKE 1 TABLET(2.5 MG) BY MOUTH DAILY  . atorvastatin (LIPITOR) 20 MG tablet TAKE 1 TABLET(20 MG) BY MOUTH DAILY  . buPROPion (WELLBUTRIN SR) 100 MG 12 hr tablet Take 1 tablet (100 mg total) by mouth 2 (two) times daily.  . calcium carbonate (TUMS - DOSED IN MG ELEMENTAL CALCIUM) 500 MG chewable tablet Chew 1 tablet (200 mg of elemental calcium total) by mouth 3 (three) times daily as needed for indigestion or heartburn.  . Cholecalciferol (VITAMIN D3) 1000 UNITS CAPS Take 1,000 Units by mouth daily.  . famotidine (PEPCID) 20 MG tablet Take 20 mg by mouth 2 (two) times daily.  Marland Kitchen losartan (COZAAR) 100 MG tablet TAKE 1 TABLET(100 MG)  BY MOUTH DAILY  . valACYclovir (VALTREX) 1000 MG tablet TAKE 2 TABLETS(2000 MG) BY MOUTH TWICE DAILY   Facility-Administered Encounter Medications as of 10/20/2020  Medication  . cyanocobalamin ((VITAMIN B-12)) injection 1,000 mcg    Allergies (verified) Sulfa antibiotics   History: Past Medical History:  Diagnosis Date  . Hyperlipidemia   . Hypertension    Past Surgical History:  Procedure Laterality Date  . BREAST EXCISIONAL BIOPSY Left 1988   benign  . BREAST SURGERY Left 1986   for false positive/biopsy  . EYE SURGERY Left 6wks ago   cataract  . FRACTURE SURGERY Left 01/14/2011   ankle  . TONSILECTOMY, ADENOIDECTOMY, BILATERAL MYRINGOTOMY AND TUBES Bilateral 1957   does not believe adenoids were removed  . TUBAL LIGATION  y-37   Family History  Problem Relation Age of Onset  . Hyperlipidemia Mother   . Cancer Mother   . Arthritis Father   . Alzheimer's disease Father   . Colon cancer Paternal Aunt    Social History   Socioeconomic History  . Marital status: Married    Spouse name: Not on file  . Number of children: Not on file  . Years of education: Not on file  . Highest education level: Not on file  Occupational History  . Not on file  Tobacco Use  . Smoking status: Former Smoker    Packs/day: 2.00    Types: Cigarettes  Quit date: 03/28/1979    Years since quitting: 41.5  . Smokeless tobacco: Never Used  Vaping Use  . Vaping Use: Never used  Substance and Sexual Activity  . Alcohol use: Yes    Alcohol/week: 5.0 standard drinks    Types: 5 Glasses of wine per week  . Drug use: No  . Sexual activity: Yes  Other Topics Concern  . Not on file  Social History Narrative  . Not on file   Social Determinants of Health   Financial Resource Strain: Low Risk   . Difficulty of Paying Living Expenses: Not hard at all  Food Insecurity: No Food Insecurity  . Worried About Charity fundraiser in the Last Year: Never true  . Ran Out of Food in the Last  Year: Never true  Transportation Needs: No Transportation Needs  . Lack of Transportation (Medical): No  . Lack of Transportation (Non-Medical): No  Physical Activity: Not on file  Stress: No Stress Concern Present  . Feeling of Stress : Not at all  Social Connections: Unknown  . Frequency of Communication with Friends and Family: More than three times a week  . Frequency of Social Gatherings with Friends and Family: Once a week  . Attends Religious Services: Not on file  . Active Member of Clubs or Organizations: Not on file  . Attends Archivist Meetings: Not on file  . Marital Status: Married    Tobacco Counseling Counseling given: Not Answered   Clinical Intake:  Pre-visit preparation completed: Yes        Diabetes: No  How often do you need to have someone help you when you read instructions, pamphlets, or other written materials from your doctor or pharmacy?: 1 - Never    Interpreter Needed?: No      Activities of Daily Living In your present state of health, do you have any difficulty performing the following activities: 10/20/2020  Hearing? N  Vision? N  Difficulty concentrating or making decisions? N  Walking or climbing stairs? N  Dressing or bathing? N  Doing errands, shopping? N  Preparing Food and eating ? N  Using the Toilet? N  In the past six months, have you accidently leaked urine? N  Do you have problems with loss of bowel control? N  Managing your Medications? N  Managing your Finances? N  Housekeeping or managing your Housekeeping? N  Some recent data might be hidden    Patient Care Team: Crecencio Mc, MD as PCP - General (Internal Medicine)  Indicate any recent Medical Services you may have received from other than [facility] providers in the past year (date may be approximate).     Assessment:   This is a routine wellness examination for Zillah.  I connected with Manisha today by telephone and verified that I am speaking  with the correct person using two identifiers. Location patient: home Location provider: work Persons participating in the virtual visit: patient, Marine scientist.    I discussed the limitations, risks, security and privacy concerns of performing an evaluation and management service by telephone and the availability of in person appointments. The patient expressed understanding and verbally consented to this telephonic visit.    Interactive audio and video telecommunications were attempted between this provider and patient, however failed, due to patient having technical difficulties OR patient did not have access to video capability.  We continued and completed visit with audio only.  Some vital signs may be absent or patient reported.  Hearing/Vision screen  Hearing Screening   125Hz  250Hz  500Hz  1000Hz  2000Hz  3000Hz  4000Hz  6000Hz  8000Hz   Right ear:           Left ear:           Comments: Patient is able to hear conversational tones without difficulty.  No issues reported.  Vision Screening Comments: Wears corrective lenses  Cataract extraction Visual acuity not assessed, virtual visit. They have seen their ophthalmologist in the last 12 months.   Dietary issues and exercise activities discussed: Current Exercise Habits: Home exercise routine, Intensity: Mild    Goals      Patient Stated   .  I need to walk daily, 30 minutes (pt-stated)      Depression Screen PHQ 2/9 Scores 10/20/2020 08/31/2020 08/31/2020 10/20/2019 09/30/2019 04/28/2018 10/24/2016  PHQ - 2 Score 0 0 0 0 0 0 0  PHQ- 9 Score 0 1 1 0 4 4 -    Fall Risk Fall Risk  10/20/2020 08/31/2020 10/20/2019 10/15/2019 09/30/2019  Falls in the past year? 0 0 0 0 0  Number falls in past yr: 0 - - - -  Injury with Fall? 0 - - - -  Follow up Falls evaluation completed Falls evaluation completed Falls evaluation completed;Falls prevention discussed Falls evaluation completed Falls evaluation completed    FALL RISK PREVENTION PERTAINING TO THE  HOME: Handrails in use when climbing stairs? Yes Home free of loose throw rugs in walkways, pet beds, electrical cords, etc? Yes  Adequate lighting in your home to reduce risk of falls? Yes   ASSISTIVE DEVICES UTILIZED TO PREVENT FALLS: Life alert? No  Use of a cane, walker or w/c? No   TIMED UP AND GO: Was the test performed? No . Virtual visit.   Cognitive Function:  Patient is alert and oriented x3.  MMSE/6CIT deferred. Normal by direct communication/observation.    6CIT Screen 10/20/2019  What Year? 0 points  What month? 0 points  What time? 0 points  Count back from 20 0 points  Months in reverse 0 points  Repeat phrase 0 points  Total Score 0   Immunizations Immunization History  Administered Date(s) Administered  . Fluad Quad(high Dose 65+) 04/08/2019, 04/18/2020  . Influenza Split 04/20/2014  . Influenza, High Dose Seasonal PF 04/07/2018  . Influenza,inj,Quad PF,6+ Mos 03/27/2013  . Influenza-Unspecified 04/18/2015, 04/23/2016, 04/23/2017, 04/24/2017  . PFIZER(Purple Top)SARS-COV-2 Vaccination 08/14/2019, 09/04/2019, 03/28/2020  . Pneumococcal Conjugate-13 09/03/2013  . Pneumococcal Polysaccharide-23 03/27/2010, 04/26/2017  . Tdap 09/14/2014  . Zoster 05/22/2013  . Zoster Recombinat (Shingrix) 02/14/2018, 04/25/2018   Health Maintenance Health Maintenance  Topic Date Due  . COVID-19 Vaccine (4 - Booster for Pfizer series) 09/25/2020  . COLONOSCOPY (Pts 45-40yrs Insurance coverage will need to be confirmed)  10/20/2021 (Originally 06/09/2018)  . INFLUENZA VACCINE  02/13/2021  . TETANUS/TDAP  09/13/2024  . DEXA SCAN  Completed  . PNA vac Low Risk Adult  Completed  . HPV VACCINES  Aged Out  . Hepatitis C Screening  Discontinued   Colonoscopy/Cologuard- declined.  Mammogram status: Completed 09/26/20. Repeat every year  Lung Cancer Screening: (Low Dose CT Chest recommended if Age 79-80 years, 30 pack-year currently smoking OR have quit w/in 15years.) does not  qualify.   Hepatitis C Screening: does not qualify.  Dental Screening: Recommended annual dental exams for proper oral hygiene.  Community Resource Referral / Chronic Care Management: CRR required this visit?  No   CCM required this visit?  No  Plan:   Keep all routine maintenance appointments.   I have personally reviewed and noted the following in the patient's chart:   . Medical and social history . Use of alcohol, tobacco or illicit drugs  . Current medications and supplements . Functional ability and status . Nutritional status . Physical activity . Advanced directives . List of other physicians . Hospitalizations, surgeries, and ER visits in previous 12 months . Vitals . Screenings to include cognitive, depression, and falls . Referrals and appointments  In addition, I have reviewed and discussed with patient certain preventive protocols, quality metrics, and best practice recommendations. A written personalized care plan for preventive services as well as general preventive health recommendations were provided to patient via mychart.     Varney Biles, LPN   08/22/621

## 2020-10-20 NOTE — Patient Instructions (Addendum)
Kara Harvey , Thank you for taking time to come for your Medicare Wellness Visit. I appreciate your ongoing commitment to your health goals. Please review the following plan we discussed and let me know if I can assist you in the future.   These are the goals we discussed: Goals      Patient Stated   .  I need to walk daily, 30 minutes (pt-stated)       This is a list of the screening recommended for you and due dates:  Health Maintenance  Topic Date Due  . COVID-19 Vaccine (4 - Booster for Pfizer series) 09/25/2020  . Colon Cancer Screening  10/20/2021*  . Flu Shot  02/13/2021  . Tetanus Vaccine  09/13/2024  . DEXA scan (bone density measurement)  Completed  . Pneumonia vaccines  Completed  . HPV Vaccine  Aged Out  .  Hepatitis C: One time screening is recommended by Center for Disease Control  (CDC) for  adults born from [birth year] through 1963.   Discontinued  *Topic was postponed. The date shown is not the original due date.    Immunizations Immunization History  Administered Date(s) Administered  . Fluad Quad(high Dose 65+) 04/08/2019, 04/18/2020  . Influenza Split 04/20/2014  . Influenza, High Dose Seasonal PF 04/07/2018  . Influenza,inj,Quad PF,6+ Mos 03/27/2013  . Influenza-Unspecified 04/18/2015, 04/23/2016, 04/23/2017, 04/24/2017  . PFIZER(Purple Top)SARS-COV-2 Vaccination 08/14/2019, 09/04/2019, 03/28/2020  . Pneumococcal Conjugate-13 09/03/2013  . Pneumococcal Polysaccharide-23 03/27/2010, 04/26/2017  . Tdap 09/14/2014  . Zoster 05/22/2013  . Zoster Recombinat (Shingrix) 02/14/2018, 04/25/2018     Advanced directives: End of life planning; Advance aging; Advanced directives discussed.  Copy of current HCPOA/Living Will requested.    Conditions/risks identified: none new.   Follow up in one year for your annual wellness visit.   Preventive Care 96 Years and Older, Female Preventive care refers to lifestyle choices and visits with your health care provider that  can promote health and wellness. What does preventive care include?  A yearly physical exam. This is also called an annual well check.  Dental exams once or twice a year.  Routine eye exams. Ask your health care provider how often you should have your eyes checked.  Personal lifestyle choices, including:  Daily care of your teeth and gums.  Regular physical activity.  Eating a healthy diet.  Avoiding tobacco and drug use.  Limiting alcohol use.  Practicing safe sex.  Taking low-dose aspirin every day.  Taking vitamin and mineral supplements as recommended by your health care provider. What happens during an annual well check? The services and screenings done by your health care provider during your annual well check will depend on your age, overall health, lifestyle risk factors, and family history of disease. Counseling  Your health care provider may ask you questions about your:  Alcohol use.  Tobacco use.  Drug use.  Emotional well-being.  Home and relationship well-being.  Sexual activity.  Eating habits.  History of falls.  Memory and ability to understand (cognition).  Work and work Statistician.  Reproductive health. Screening  You may have the following tests or measurements:  Height, weight, and BMI.  Blood pressure.  Lipid and cholesterol levels. These may be checked every 5 years, or more frequently if you are over 82 years old.  Skin check.  Lung cancer screening. You may have this screening every year starting at age 74 if you have a 30-pack-year history of smoking and currently smoke or have quit within  the past 15 years.  Fecal occult blood test (FOBT) of the stool. You may have this test every year starting at age 60.  Flexible sigmoidoscopy or colonoscopy. You may have a sigmoidoscopy every 5 years or a colonoscopy every 10 years starting at age 74.  Hepatitis C blood test.  Hepatitis B blood test.  Sexually transmitted disease  (STD) testing.  Diabetes screening. This is done by checking your blood sugar (glucose) after you have not eaten for a while (fasting). You may have this done every 1-3 years.  Bone density scan. This is done to screen for osteoporosis. You may have this done starting at age 42.  Mammogram. This may be done every 1-2 years. Talk to your health care provider about how often you should have regular mammograms. Talk with your health care provider about your test results, treatment options, and if necessary, the need for more tests. Vaccines  Your health care provider may recommend certain vaccines, such as:  Influenza vaccine. This is recommended every year.  Tetanus, diphtheria, and acellular pertussis (Tdap, Td) vaccine. You may need a Td booster every 10 years.  Zoster vaccine. You may need this after age 47.  Pneumococcal 13-valent conjugate (PCV13) vaccine. One dose is recommended after age 54.  Pneumococcal polysaccharide (PPSV23) vaccine. One dose is recommended after age 39. Talk to your health care provider about which screenings and vaccines you need and how often you need them. This information is not intended to replace advice given to you by your health care provider. Make sure you discuss any questions you have with your health care provider. Document Released: 07/29/2015 Document Revised: 03/21/2016 Document Reviewed: 05/03/2015 Elsevier Interactive Patient Education  2017 [place] Prevention in the Home Falls can cause injuries. They can happen to people of all ages. There are many things you can do to make your home safe and to help prevent falls. What can I do on the outside of my home?  Regularly fix the edges of walkways and driveways and fix any cracks.  Remove anything that might make you trip as you walk through a door, such as a raised step or threshold.  Trim any bushes or trees on the path to your home.  Use bright outdoor lighting.  Clear any  walking paths of anything that might make someone trip, such as rocks or tools.  Regularly check to see if handrails are loose or broken. Make sure that both sides of any steps have handrails.  Any raised decks and porches should have guardrails on the edges.  Have any leaves, snow, or ice cleared regularly.  Use sand or salt on walking paths during winter.  Clean up any spills in your garage right away. This includes oil or grease spills. What can I do in the bathroom?  Use night lights.  Install grab bars by the toilet and in the tub and shower. Do not use towel bars as grab bars.  Use non-skid mats or decals in the tub or shower.  If you need to sit down in the shower, use a plastic, non-slip stool.  Keep the floor dry. Clean up any water that spills on the floor as soon as it happens.  Remove soap buildup in the tub or shower regularly.  Attach bath mats securely with double-sided non-slip rug tape.  Do not have throw rugs and other things on the floor that can make you trip. What can I do in the bedroom?  Use night  lights.  Make sure that you have a light by your bed that is easy to reach.  Do not use any sheets or blankets that are too big for your bed. They should not hang down onto the floor.  Have a firm chair that has side arms. You can use this for support while you get dressed.  Do not have throw rugs and other things on the floor that can make you trip. What can I do in the kitchen?  Clean up any spills right away.  Avoid walking on wet floors.  Keep items that you use a lot in easy-to-reach places.  If you need to reach something above you, use a strong step stool that has a grab bar.  Keep electrical cords out of the way.  Do not use floor polish or wax that makes floors slippery. If you must use wax, use non-skid floor wax.  Do not have throw rugs and other things on the floor that can make you trip. What can I do with my stairs?  Do not leave  any items on the stairs.  Make sure that there are handrails on both sides of the stairs and use them. Fix handrails that are broken or loose. Make sure that handrails are as long as the stairways.  Check any carpeting to make sure that it is firmly attached to the stairs. Fix any carpet that is loose or worn.  Avoid having throw rugs at the top or bottom of the stairs. If you do have throw rugs, attach them to the floor with carpet tape.  Make sure that you have a light switch at the top of the stairs and the bottom of the stairs. If you do not have them, ask someone to add them for you. What else can I do to help prevent falls?  Wear shoes that:  Do not have high heels.  Have rubber bottoms.  Are comfortable and fit you well.  Are closed at the toe. Do not wear sandals.  If you use a stepladder:  Make sure that it is fully opened. Do not climb a closed stepladder.  Make sure that both sides of the stepladder are locked into place.  Ask someone to hold it for you, if possible.  Clearly mark and make sure that you can see:  Any grab bars or handrails.  First and last steps.  Where the edge of each step is.  Use tools that help you move around (mobility aids) if they are needed. These include:  Canes.  Walkers.  Scooters.  Crutches.  Turn on the lights when you go into a dark area. Replace any light bulbs as soon as they burn out.  Set up your furniture so you have a clear path. Avoid moving your furniture around.  If any of your floors are uneven, fix them.  If there are any pets around you, be aware of where they are.  Review your medicines with your doctor. Some medicines can make you feel dizzy. This can increase your chance of falling. Ask your doctor what other things that you can do to help prevent falls. This information is not intended to replace advice given to you by your health care provider. Make sure you discuss any questions you have with your  health care provider. Document Released: 04/28/2009 Document Revised: 12/08/2015 Document Reviewed: 08/06/2014 Elsevier Interactive Patient Education  2017 [employer].

## 2020-11-26 ENCOUNTER — Other Ambulatory Visit: Payer: Self-pay | Admitting: Internal Medicine

## 2020-12-13 ENCOUNTER — Other Ambulatory Visit: Payer: Self-pay | Admitting: Internal Medicine

## 2020-12-14 NOTE — Telephone Encounter (Signed)
Patient returned office phone call. She is going to call her pharmacy for refill. Patient will call office back if there is an issue with pharmacy.

## 2020-12-14 NOTE — Telephone Encounter (Signed)
Called patient to confirm necessity of [place] Refill. Patient should have one refill remaining at the pharmacy for pick up as patient was sent on 90 day supply with one refill on 09/12/2020.

## 2021-02-03 ENCOUNTER — Other Ambulatory Visit: Payer: Self-pay | Admitting: Internal Medicine

## 2021-02-13 ENCOUNTER — Other Ambulatory Visit: Payer: Self-pay | Admitting: Internal Medicine

## 2021-02-13 NOTE — Telephone Encounter (Signed)
RX Refill:xanax Last Seen:08-31-20 Last ordered:06-24-20

## 2021-02-22 ENCOUNTER — Other Ambulatory Visit: Payer: Self-pay | Admitting: Internal Medicine

## 2021-03-13 ENCOUNTER — Other Ambulatory Visit: Payer: Self-pay | Admitting: Internal Medicine

## 2021-03-27 IMAGING — MG MM DIGITAL SCREENING BILAT W/ TOMO AND CAD
8 series · 8 of 24 positions shown · non-contrast
Comparison: Previous exam(s).

CLINICAL DATA: Screening.

EXAM:
DIGITAL SCREENING BILATERAL MAMMOGRAM WITH TOMOSYNTHESIS AND CAD
TECHNIQUE: Bilateral screening digital craniocaudal and mediolateral oblique
mammograms were obtained. Bilateral screening digital breast
tomosynthesis was performed. The images were evaluated with
computer-aided detection.

[R MLO synth-2D]
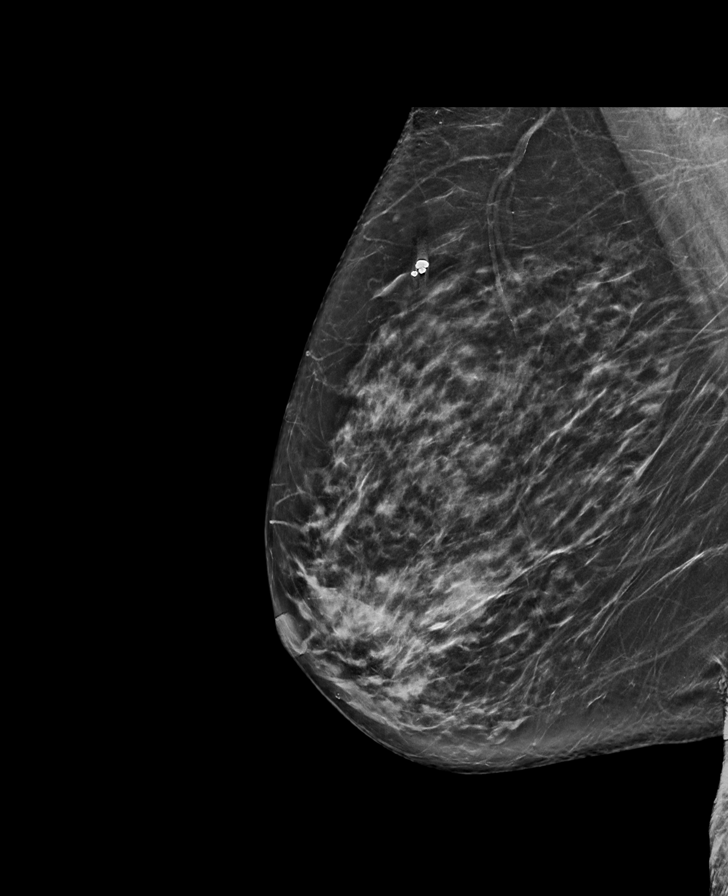

[L CC synth-2D]
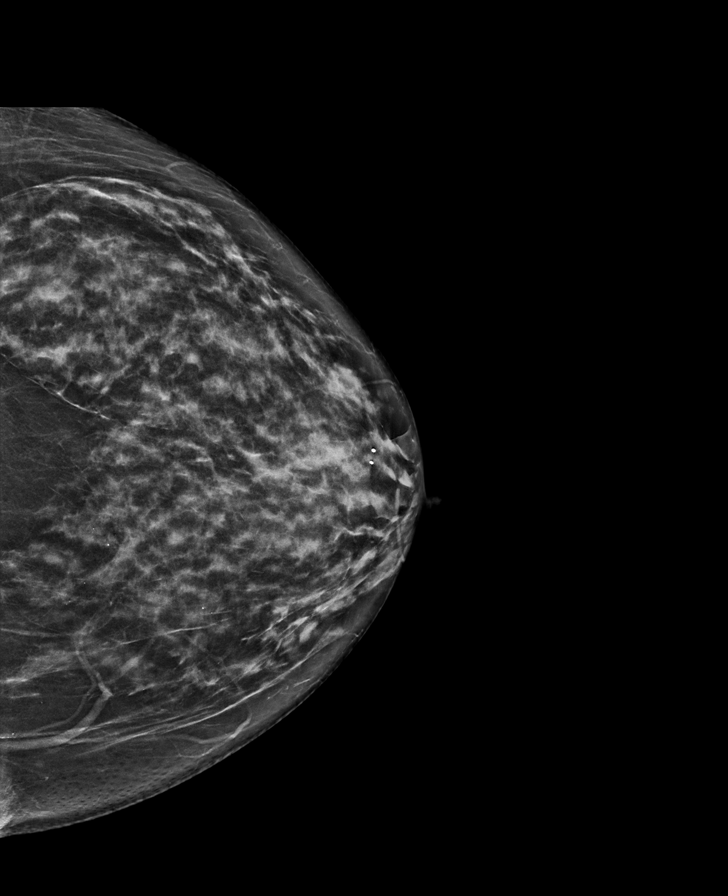

[L MLO synth-2D]
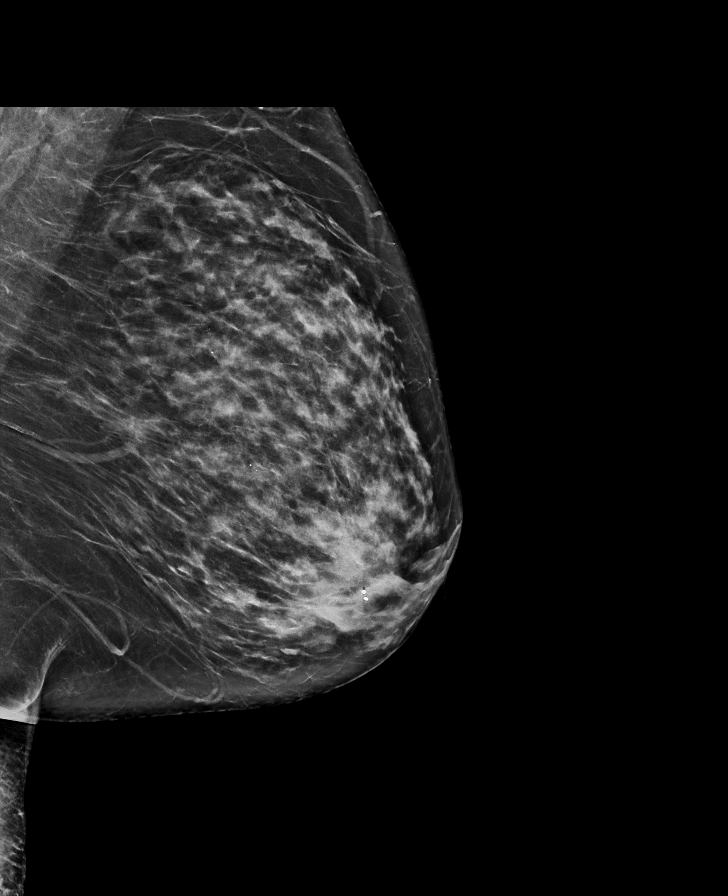

[R CC synth-2D]
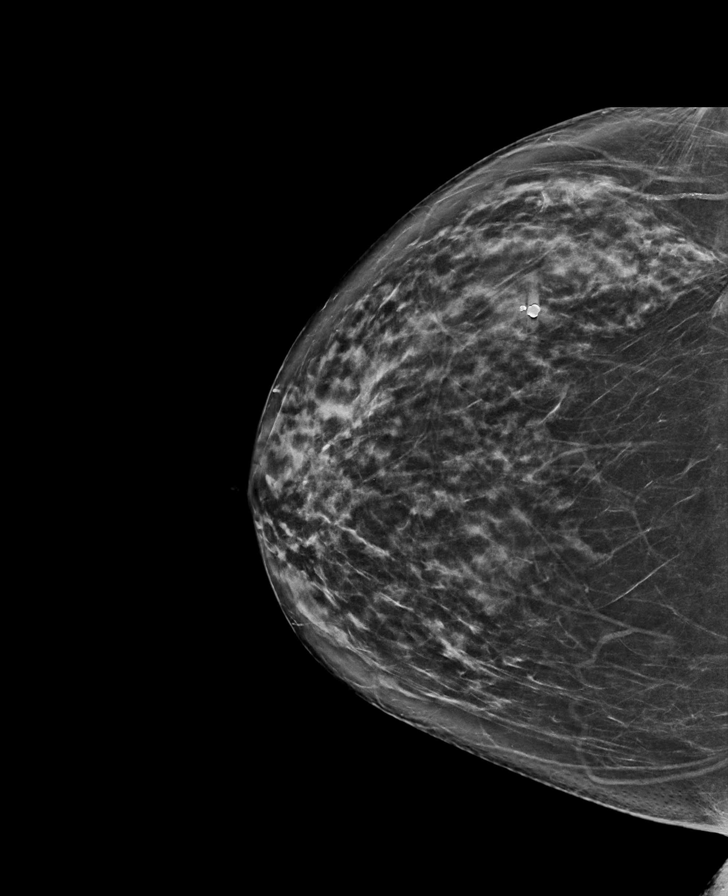

[L CC tomo · tomo slice 35/69.0]
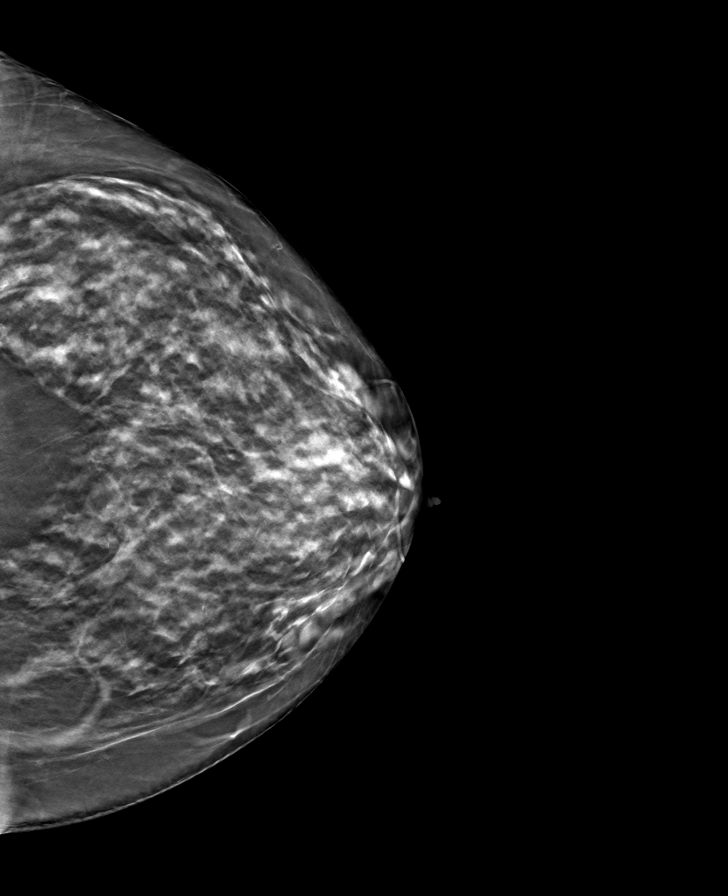

[R CC tomo · tomo slice 38/75.0]
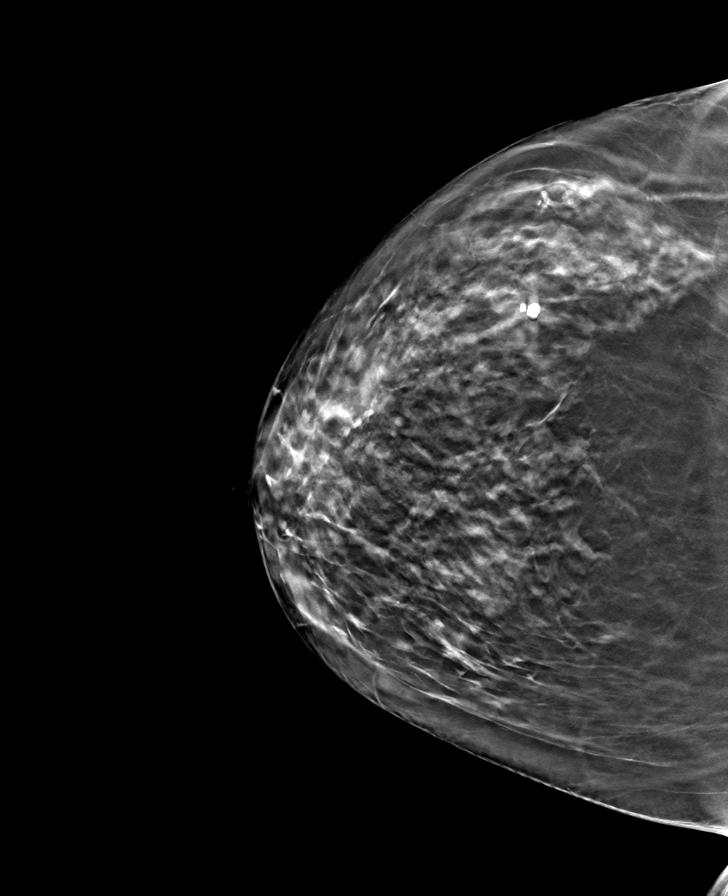

[R MLO tomo · tomo slice 39/78.0]
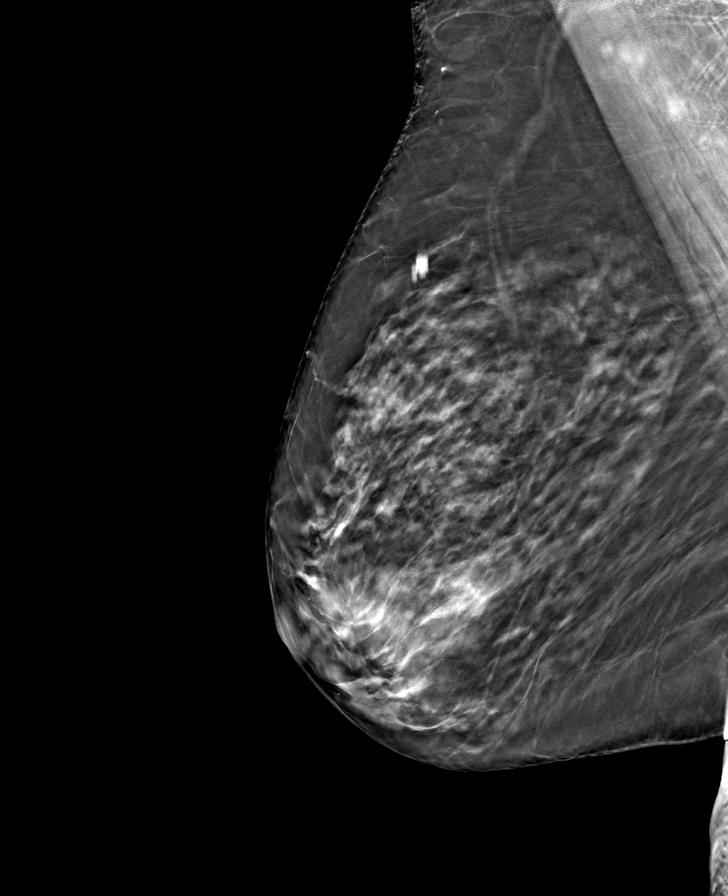

[L MLO tomo · tomo slice 40/79.0]
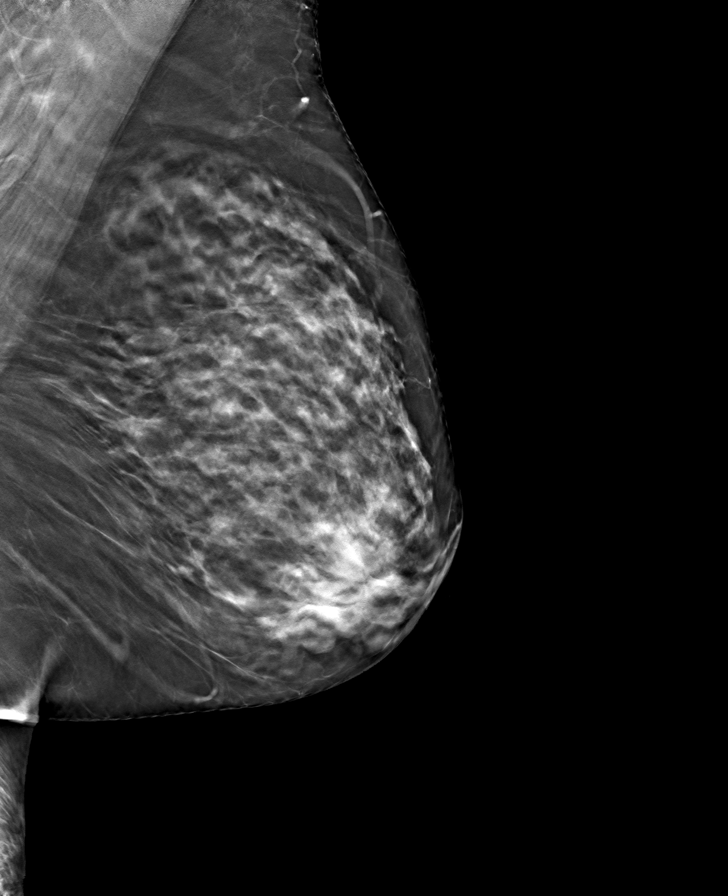

[8 of 24 positions shown; findings below may reference images not displayed]

ACR Breast Density Category c: The breast tissue is heterogeneously
dense, which may obscure small masses.
FINDINGS: There are no findings suspicious for malignancy. The images were
evaluated with computer-aided detection.
IMPRESSION: No mammographic evidence of malignancy. A result letter of this
screening mammogram will be mailed directly to the patient.

RECOMMENDATION:
Screening mammogram in one year. (Code:T4-5-GWO)

BI-RADS CATEGORY  1: Negative.

## 2021-04-05 ENCOUNTER — Other Ambulatory Visit: Payer: Self-pay | Admitting: Internal Medicine

## 2021-05-15 ENCOUNTER — Other Ambulatory Visit: Payer: Self-pay | Admitting: Internal Medicine

## 2021-05-19 ENCOUNTER — Other Ambulatory Visit: Payer: Self-pay | Admitting: Internal Medicine

## 2021-05-19 NOTE — Telephone Encounter (Signed)
RX Refill: xanax Last Seen: 08-31-20 Last Ordered: 02-13-21 Next Appt: NA

## 2021-07-03 ENCOUNTER — Other Ambulatory Visit: Payer: Self-pay | Admitting: Internal Medicine

## 2021-08-02 ENCOUNTER — Other Ambulatory Visit: Payer: Self-pay | Admitting: Internal Medicine

## 2021-09-21 ENCOUNTER — Other Ambulatory Visit: Payer: Self-pay | Admitting: Internal Medicine

## 2021-09-21 ENCOUNTER — Encounter: Payer: Medicare Other | Admitting: Dermatology

## 2021-10-23 ENCOUNTER — Ambulatory Visit (INDEPENDENT_AMBULATORY_CARE_PROVIDER_SITE_OTHER): Payer: Medicare Other

## 2021-10-23 VITALS — BP 135/68 | HR 92 | Ht 61.5 in | Wt 189.0 lb

## 2021-10-23 DIAGNOSIS — Z0000 Encounter for general adult medical examination without abnormal findings: Secondary | ICD-10-CM

## 2021-10-23 NOTE — Progress Notes (Addendum)
Subjective:   Kara Harvey is a 81 y.o. female who presents for Medicare Annual (Subsequent) preventive examination.  Review of Systems    No ROS.  Medicare Wellness Virtual Visit.  Visual/audio telehealth visit, UTA vital signs.   See social history for additional risk factors.   Cardiac Risk Factors include: advanced age (>74men, >42 women)     Objective:    Today's Vitals   10/23/21 1402  BP: 135/68  Pulse: 92  Weight: 189 lb (85.7 kg)  Height: 5' 1.5" (1.562 m)   Body mass index is 35.13 kg/m.     10/23/2021    2:09 PM 10/20/2020    1:42 PM 10/20/2019    1:56 PM 10/13/2019   11:49 AM 07/13/2017   11:15 PM 07/13/2017    5:30 PM  Advanced Directives  Does Patient Have a Medical Advance Directive? Yes Yes Yes Yes No No  Type of Estate agent of [place];Living will Healthcare Power of [place];Living will  Healthcare Power of [place];Living will    Does patient want to make changes to medical advance directive? No - Patient declined No - Patient declined No - Patient declined No - Patient declined    Copy of Healthcare Power of Attorney in Chart? No - copy requested No - copy requested No - copy requested No - copy requested    Would patient like information on creating a medical advance directive?    No - Patient declined No - Patient declined No - Patient declined    Current Medications (verified) Outpatient Encounter Medications as of 10/23/2021  Medication Sig   ALPRAZolam (XANAX) 0.25 MG tablet TAKE 1 TABLET(0.25 MG) BY MOUTH DAILY AS NEEDED FOR ANXIETY   amLODipine (NORVASC) 2.5 MG tablet TAKE 1 TABLET(2.5 MG) BY MOUTH DAILY   atorvastatin (LIPITOR) 20 MG tablet TAKE 1 TABLET(20 MG) BY MOUTH DAILY   buPROPion ER (WELLBUTRIN SR) 100 MG 12 hr tablet TAKE 1 TABLET(100 MG) BY MOUTH TWICE DAILY   calcium carbonate (TUMS - DOSED IN MG ELEMENTAL CALCIUM) 500 MG chewable tablet Chew 1 tablet (200 mg of elemental calcium total) by mouth 3 (three)  times daily as needed for indigestion or heartburn.   Cholecalciferol (VITAMIN D3) 1000 UNITS CAPS Take 1,000 Units by mouth daily.   famotidine (PEPCID) 20 MG tablet Take 20 mg by mouth 2 (two) times daily.   losartan (COZAAR) 100 MG tablet TAKE 1 TABLET(100 MG) BY MOUTH DAILY   valACYclovir (VALTREX) 1000 MG tablet TAKE 2 TABLETS(2000 MG) BY MOUTH TWICE DAILY   Facility-Administered Encounter Medications as of 10/23/2021  Medication   cyanocobalamin ((VITAMIN B-12)) injection 1,000 mcg    Allergies (verified) Sulfa antibiotics   History: Past Medical History:  Diagnosis Date   Hyperlipidemia    Hypertension    Past Surgical History:  Procedure Laterality Date   BREAST EXCISIONAL BIOPSY Left 1988   benign   BREAST SURGERY Left 1986   for false positive/biopsy   EYE SURGERY Left 6wks ago   cataract   FRACTURE SURGERY Left 01/14/2011   ankle   TONSILECTOMY, ADENOIDECTOMY, BILATERAL MYRINGOTOMY AND TUBES Bilateral 1957   does not believe adenoids were removed   TUBAL LIGATION  y-37   Family History  Problem Relation Age of Onset   Hyperlipidemia Mother    Cancer Mother    Arthritis Father    Alzheimer's disease Father    Colon cancer Paternal Aunt    Social History   Socioeconomic History  Marital status: Married    Spouse name: Not on file   Number of children: Not on file   Years of education: Not on file   Highest education level: Not on file  Occupational History   Not on file  Tobacco Use   Smoking status: Former    Packs/day: 2.00    Types: Cigarettes    Quit date: 03/28/1979    Years since quitting: 42.6   Smokeless tobacco: Never  Vaping Use   Vaping Use: Never used  Substance and Sexual Activity   Alcohol use: Yes    Alcohol/week: 5.0 standard drinks    Types: 5 Glasses of wine per week   Drug use: No   Sexual activity: Yes  Other Topics Concern   Not on file  Social History Narrative   Not on file   Social Determinants of Health    Financial Resource Strain: Low Risk    Difficulty of Paying Living Expenses: Not hard at all  Food Insecurity: No Food Insecurity   Worried About Programme researcher, broadcasting/film/video in the Last Year: Never true   Ran Out of Food in the Last Year: Never true  Transportation Needs: No Transportation Needs   Lack of Transportation (Medical): No   Lack of Transportation (Non-Medical): No  Physical Activity: Not on file  Stress: No Stress Concern Present   Feeling of Stress : Not at all  Social Connections: Unknown   Frequency of Communication with Friends and Family: More than three times a week   Frequency of Social Gatherings with Friends and Family: Once a week   Attends Religious Services: Not on Scientist, clinical (histocompatibility and immunogenetics) or Organizations: Not on file   Attends Banker Meetings: Not on file   Marital Status: Married    Tobacco Counseling Counseling given: Not Answered   Clinical Intake:  Pre-visit preparation completed: Yes        Diabetes: No  How often do you need to have someone help you when you read instructions, pamphlets, or other written materials from your doctor or pharmacy?: 1 - Never  Interpreter Needed?: No    Activities of Daily Living    10/23/2021    2:11 PM  In your present state of health, do you have any difficulty performing the following activities:  Hearing? 0  Vision? 0  Difficulty concentrating or making decisions? 0  Walking or climbing stairs? 0  Dressing or bathing? 0  Doing errands, shopping? 0  Preparing Food and eating ? N  Using the Toilet? N  In the past six months, have you accidently leaked urine? N  Do you have problems with loss of bowel control? N  Managing your Medications? N  Managing your Finances? N  Housekeeping or managing your Housekeeping? N   Patient Care Team: Sherlene Shams, MD as PCP - General (Internal Medicine)  Indicate any recent Medical Services you may have received from other than [facility] providers  in the past year (date may be approximate).     Assessment:   This is a routine wellness examination for Kara Harvey.  Virtual Visit via Telephone Note  I connected with  Kara Harvey on 10/23/21 at  2:00 PM EDT by telephone and verified that I am speaking with the correct person using two identifiers.  Persons participating in the virtual visit: patient/Nurse Health Advisor   I discussed the limitations of performing an evaluation and management service by telehealth. The patient expressed understanding and  agreed to proceed. We continued and completed visit with audio only. Some vital signs may be absent or patient reported.   Hearing/Vision screen Hearing Screening - Comments:: Patient is able to hear conversational tones without difficulty. No issues reported. Vision Screening - Comments:: Wears corrective lenses  Cataract extraction They have seen their ophthalmologist in the last 12 months.   Dietary issues and exercise activities discussed: Current Exercise Habits: Structured exercise class, Time (Minutes): 30, Frequency (Times/Week): 3, Weekly Exercise (Minutes/Week): 90, Intensity: Mild Healthy diet Good water intake   Goals Addressed               This Visit's Progress     Patient Stated     I need to walk daily, 30 minutes (pt-stated)        As tolerated.       Depression Screen    10/23/2021    2:06 PM 10/20/2020    1:41 PM 08/31/2020    3:05 PM 08/31/2020    2:24 PM 10/20/2019    2:14 PM 09/30/2019    2:30 PM 04/28/2018   11:05 AM  PHQ 2/9 Scores  PHQ - 2 Score 0 0 0 0 0 0 0  PHQ- 9 Score  0 1 1 0 4 4    Fall Risk    10/23/2021    2:09 PM 10/20/2020    1:44 PM 08/31/2020    2:16 PM 10/20/2019    1:57 PM 10/15/2019   12:40 PM  Fall Risk   Falls in the past year? 0 0 0 0 0  Number falls in past yr: 0 0     Injury with Fall?  0     Follow up Falls evaluation completed Falls evaluation completed Falls evaluation completed Falls evaluation completed;Falls  prevention discussed Falls evaluation completed   FALL RISK PREVENTION PERTAINING TO THE HOME: Home free of loose throw rugs in walkways, pet beds, electrical cords, etc? Yes  Adequate lighting in your home to reduce risk of falls? Yes   ASSISTIVE DEVICES UTILIZED TO PREVENT FALLS: Life alert? No  Use of a cane, walker or w/c? No  Grab bars in the bathroom? No  Shower chair or bench in shower? No  Elevated toilet seat or a handicapped toilet? No   TIMED UP AND GO: Was the test performed? No .   Cognitive Function:  Patient is alert and oriented x3.       10/23/2021    2:19 PM 10/20/2019    2:11 PM  6CIT Screen  What Year? 0 points 0 points  What month? 0 points 0 points  What time? 0 points 0 points  Count back from 20 0 points 0 points  Months in reverse 0 points 0 points  Repeat phrase 0 points 0 points  Total Score 0 points 0 points    Immunizations Immunization History  Administered Date(s) Administered   Fluad Quad(high Dose 65+) 04/08/2019, 04/18/2020   Influenza Split 04/20/2014   Influenza, High Dose Seasonal PF 04/07/2018   Influenza,inj,Quad PF,6+ Mos 03/27/2013   Influenza-Unspecified 04/18/2015, 04/23/2016, 04/23/2017, 04/24/2017   Moderna Covid-19 Vaccine Bivalent Booster 34yrs & up 03/26/2021   PFIZER(Purple Top)SARS-COV-2 Vaccination 08/14/2019, 09/04/2019, 03/28/2020, 11/01/2020   PNEUMOCOCCAL CONJUGATE-20 09/17/2021   Pneumococcal Conjugate-13 09/03/2013   Pneumococcal Polysaccharide-23 03/27/2010, 04/26/2017   Tdap 09/14/2014   Zoster Recombinat (Shingrix) 02/14/2018, 04/25/2018   Zoster, Live 05/22/2013   Screening Tests Health Maintenance  Topic Date Due   COLONOSCOPY (Pts 45-39yrs Insurance coverage will  need to be confirmed)  11/27/2021 (Originally 06/09/2018)   INFLUENZA VACCINE  02/13/2022   TETANUS/TDAP  09/13/2024   Pneumonia Vaccine 48+ Years old  Completed   DEXA SCAN  Completed   COVID-19 Vaccine  Completed   Zoster Vaccines-  Shingrix  Completed   HPV VACCINES  Aged Out   Hepatitis C Screening  Discontinued   Health Maintenance There are no preventive care reminders to display for this patient.  Colonoscopy- declined.   Lung Cancer Screening: (Low Dose CT Chest recommended if Age 11-80 years, 30 pack-year currently smoking OR have quit w/in 15years.) does not qualify.   Hepatitis C Screening: does not qualify  Vision Screening: Recommended annual ophthalmology exams for early detection of glaucoma and other disorders of the eye.  Dental Screening: Recommended annual dental exams for proper oral hygiene  Community Resource Referral / Chronic Care Management: CRR required this visit?  No   CCM required this visit?  No      Plan:   Keep all routine maintenance appointments.   I have personally reviewed and noted the following in the patient's chart:   Medical and social history Use of alcohol, tobacco or illicit drugs  Current medications and supplements including opioid prescriptions.  Functional ability and status Nutritional status Physical activity Advanced directives List of other physicians Hospitalizations, surgeries, and ER visits in previous 12 months Vitals Screenings to include cognitive, depression, and falls Referrals and appointments  In addition, I have reviewed and discussed with patient certain preventive protocols, quality metrics, and best practice recommendations. A written personalized care plan for preventive services as well as general preventive health recommendations were provided to patient.     OBrien-Blaney, Antolin Belsito L, LPN   [id number]/0960      I have reviewed the above information and agree with above.   Duncan Dull, MD

## 2021-10-23 NOTE — Patient Instructions (Addendum)
?  Kara Harvey , ?Thank you for taking time to come for your Medicare Wellness Visit. I appreciate your ongoing commitment to your health goals. Please review the following plan we discussed and let me know if I can assist you in the future.  ? ?These are the goals we discussed: ? Goals   ? ?  ? Patient Stated  ?   I need to walk daily, 30 minutes (pt-stated)   ?   As tolerated. ?  ? ?  ?  ?This is a list of the screening recommended for you and due dates:  ?Health Maintenance  ?Topic Date Due  ? Colon Cancer Screening  11/27/2021*  ? Flu Shot  02/13/2022  ? Tetanus Vaccine  09/13/2024  ? Pneumonia Vaccine  Completed  ? DEXA scan (bone density measurement)  Completed  ? COVID-19 Vaccine  Completed  ? Zoster (Shingles) Vaccine  Completed  ? HPV Vaccine  Aged Out  ? Hepatitis C Screening: USPSTF Recommendation to screen - Ages 50-79 yo.  Discontinued  ?*Topic was postponed. The date shown is not the original due date.  ?  ?

## 2021-11-11 ENCOUNTER — Other Ambulatory Visit: Payer: Self-pay | Admitting: Internal Medicine

## 2021-11-21 ENCOUNTER — Ambulatory Visit: Payer: Medicare Other | Admitting: Dermatology

## 2021-11-21 DIAGNOSIS — L578 Other skin changes due to chronic exposure to nonionizing radiation: Secondary | ICD-10-CM | POA: Diagnosis not present

## 2021-11-21 DIAGNOSIS — L821 Other seborrheic keratosis: Secondary | ICD-10-CM

## 2021-11-21 DIAGNOSIS — L820 Inflamed seborrheic keratosis: Secondary | ICD-10-CM | POA: Diagnosis not present

## 2021-11-21 DIAGNOSIS — L814 Other melanin hyperpigmentation: Secondary | ICD-10-CM | POA: Diagnosis not present

## 2021-11-21 DIAGNOSIS — D225 Melanocytic nevi of trunk: Secondary | ICD-10-CM

## 2021-11-21 DIAGNOSIS — L853 Xerosis cutis: Secondary | ICD-10-CM | POA: Diagnosis not present

## 2021-11-21 DIAGNOSIS — D692 Other nonthrombocytopenic purpura: Secondary | ICD-10-CM

## 2021-11-21 DIAGNOSIS — Z1283 Encounter for screening for malignant neoplasm of skin: Secondary | ICD-10-CM

## 2021-11-21 DIAGNOSIS — I8393 Asymptomatic varicose veins of bilateral lower extremities: Secondary | ICD-10-CM | POA: Diagnosis not present

## 2021-11-21 DIAGNOSIS — D229 Melanocytic nevi, unspecified: Secondary | ICD-10-CM | POA: Diagnosis not present

## 2021-11-21 DIAGNOSIS — D1800 Hemangioma unspecified site: Secondary | ICD-10-CM | POA: Diagnosis not present

## 2021-11-21 NOTE — Progress Notes (Signed)
? ?Follow-Up Visit ?  ?Subjective  ?Kara Harvey is a 81 y.o. female who presents for the following: Annual Exam (1 yr tbse. Patient reports a spot at back she would like checked. ). ? ? ?The patient presents for Total-Body Skin Exam (TBSE) for skin cancer screening and mole check.  The patient has spots, moles and lesions to be evaluated, some may be new or changing and the patient has concerns that these could be cancer. ? ? ?The following portions of the chart were reviewed this encounter and updated as appropriate:   ?  ? ?Review of Systems: No other skin or systemic complaints except as noted in HPI or Assessment and Plan. ? ? ?Objective  ?Well appearing patient in no apparent distress; mood and affect are within normal limits. ? ?A full examination was performed including scalp, head, eyes, ears, nose, lips, neck, chest, axillae, abdomen, back, buttocks, bilateral upper extremities, bilateral lower extremities, hands, feet, fingers, toes, fingernails, and toenails. All findings within normal limits unless otherwise noted below. ? ?right mid back and right lower leg ?Pink stuck-on, waxy macules lower leg, tan waxy papule r mid back ? ?left spinal upper back ?3 mm two toned brown macule  ? ? ?Assessment & Plan  ?Inflamed seborrheic keratosis ?right mid back and right lower leg ? ?Reassured benign age-related growth.  Recommend observation.  Discussed cryotherapy if spot(s) become irritated or inflamed.  ? ?Patient deferred treatment at this time. ?Can use Eucrisa ointment to aa's at right lower leg and right mid back  ? ? ?Nevus ?left spinal upper back ? ?Benign-appearing.  Observation.  Call clinic for new or changing lesions.  Recommend daily use of broad spectrum spf 30+ sunscreen to sun-exposed areas.  ? ? ?Lentigines ?- Scattered tan macules ?- Due to sun exposure ?- Benign-appearing, observe ?- Recommend daily broad spectrum sunscreen SPF 30+ to sun-exposed areas, reapply every 2 hours as needed. ?-  Call for any changes ? ?Purpura - Chronic; persistent and recurrent.  Treatable, but not curable. At b/l arms  ?- Violaceous macules and patches ?- Benign ?- Related to trauma, age, sun damage and/or use of blood thinners, chronic use of topical and/or oral steroids ?- Observe ?- Can use OTC arnica containing moisturizer such as Dermend Bruise Formula if desired ?- Call for worsening or other concerns ? ?Dermatofibroma ?- Firm pink/brown papulenodule with dimple sign at right medial lower thigh ?- Benign appearing ?- Call for any changes ? ?Xerosis ?- diffuse xerotic patches ?- recommend gentle, hydrating skin care ?- gentle skin care handout given ? ? ?Varicose Veins/Spider Veins ?- Dilated blue, purple or red veins at the lower extremities ?- Reassured ?- Smaller vessels can be treated by sclerotherapy (a procedure to inject a medicine into the veins to make them disappear) if desired, but the treatment is not covered by insurance. Larger vessels may be covered if symptomatic and we would refer to vascular surgeon if treatment desired. ? ?Seborrheic Keratoses ?- Stuck-on, waxy, tan-brown papules and/or plaques  ?- Benign-appearing ?- Discussed benign etiology and prognosis. ?- Observe ?- Call for any changes ? ?Melanocytic Nevi ?- Tan-brown and/or pink-flesh-colored symmetric macules and papules ?- Benign appearing on exam today ?- Observation ?- Call clinic for new or changing moles ?- Recommend daily use of broad spectrum spf 30+ sunscreen to sun-exposed areas.  ? ?Hemangiomas ?- Red papules ?- Discussed benign nature ?- Observe ?- Call for any changes ? ?Actinic Damage ?- Chronic condition, secondary to cumulative  UV/sun exposure ?- diffuse scaly erythematous macules with underlying dyspigmentation ?- Recommend daily broad spectrum sunscreen SPF 30+ to sun-exposed areas, reapply every 2 hours as needed.  ?- Staying in the shade or wearing long sleeves, sun glasses (UVA+UVB protection) and wide brim hats (4-inch  brim around the entire circumference of the hat) are also recommended for sun protection.  ?- Call for new or changing lesions. ? ?Skin cancer screening performed today. ?Return in about 1 year (around 11/22/2022) for TBSE. ?I, Ruthell Rummage, CMA, am acting as scribe for Brendolyn Patty, MD. ? ?Documentation: I have reviewed the above documentation for accuracy and completeness, and I agree with the above. ? ?Brendolyn Patty MD  ?

## 2021-11-21 NOTE — Patient Instructions (Addendum)
For fine lines and wrinkles ?Cerave Renewing Retinol serum - apply topically to face nightly ?Use a good sunscreen daily Cerave Sun screen samples given  ? ? ? ?Gentle Skin Care Guide ? ?1. Bathe no more than once a day. ? ?2. Avoid bathing in hot water ? ?3. Use a mild soap like Dove, Vanicream, Cetaphil, CeraVe. Can use Lever 2000 or Cetaphil antibacterial soap ? ?4. Use soap only where you need it. On most days, use it under your arms, between your legs, and on your feet. Let the water rinse other areas unless visibly dirty. ? ?5. When you get out of the bath/shower, use a towel to gently blot your skin dry, don't rub it. ? ?6. While your skin is still a little damp, apply a moisturizing cream such as Vanicream, CeraVe, Cetaphil, Eucerin, Sarna lotion or plain Vaseline Jelly. For hands apply Neutrogena [place] Hand Cream or Excipial Hand Cream. ? ?7. Reapply moisturizer any time you start to itch or feel dry. ? ?8. Sometimes using free and clear laundry detergents can be helpful. Fabric softener sheets should be avoided. Downy Free & Gentle liquid, or any liquid fabric softener that is free of dyes and perfumes, it acceptable to use ? ?9. If your doctor has given you prescription creams you may apply moisturizers over them  ? ? ? ? ?Renewing face  ? ? ?Cryotherapy Aftercare ? ?Wash gently with soap and water everyday.   ?Apply Vaseline and Band-Aid daily until healed.  ? ? ?Can use Eucrisa sample - apply to spots at legs as needed.  ? ?Seborrheic Keratosis ? ?What causes seborrheic keratoses? ?Seborrheic keratoses are harmless, common skin growths that first appear during adult life.  As time goes by, more growths appear.  Some people may develop a large number of them.  Seborrheic keratoses appear on both covered and uncovered body parts.  They are not caused by sunlight.  The tendency to develop seborrheic keratoses can be inherited.  They vary in color from skin-colored to gray, brown, or even black.  They  can be either smooth or have a rough, warty surface.   ?Seborrheic keratoses are superficial and look as if they were stuck on the skin.  Under the microscope this type of keratosis looks like layers upon layers of skin.  That is why at times the top layer may seem to fall off, but the rest of the growth remains and re-grows.   ? ?Treatment ?Seborrheic keratoses do not need to be treated, but can easily be removed in the office.  Seborrheic keratoses often cause symptoms when they rub on clothing or jewelry.  Lesions can be in the way of shaving.  If they become inflamed, they can cause itching, soreness, or burning.  Removal of a seborrheic keratosis can be accomplished by freezing, burning, or surgery. ?If any spot bleeds, scabs, or grows rapidly, please return to have it checked, as these can be an indication of a skin cancer. ? ? ? ? ? ? ?Melanoma ABCDEs ? ?Melanoma is the most dangerous type of skin cancer, and is the leading cause of death from skin disease.  You are more likely to develop melanoma if you: ?Have light-colored skin, light-colored eyes, or red or blond hair ?Spend a lot of time in the sun ?Tan regularly, either outdoors or in a tanning bed ?Have had blistering sunburns, especially during childhood ?Have a close family member who has had a melanoma ?Have atypical moles or large birthmarks ? ?  Early detection of melanoma is key since treatment is typically straightforward and cure rates are extremely high if we catch it early.  ? ?The first sign of melanoma is often a change in a mole or a new dark spot.  The ABCDE system is a way of remembering the signs of melanoma. ? ?A for asymmetry:  The two halves do not match. ?B for border:  The edges of the growth are irregular. ?C for color:  A mixture of colors are present instead of an even brown color. ?D for diameter:  Melanomas are usually (but not always) greater than 3m - the size of a pencil eraser. ?E for evolution:  The spot keeps changing in  size, shape, and color. ? ?Please check your skin once per month between visits. You can use a small mirror in front and a large mirror behind you to keep an eye on the back side or your body.  ? ?If you see any new or changing lesions before your next follow-up, please call to schedule a visit. ? ?Please continue daily skin protection including broad spectrum sunscreen SPF 30+ to sun-exposed areas, reapplying every 2 hours as needed when you're outdoors.  ? ?Staying in the shade or wearing long sleeves, sun glasses (UVA+UVB protection) and wide brim hats (4-inch brim around the entire circumference of the hat) are also recommended for sun protection.   ? ? ? ? ?If You Need Anything After Your Visit ? ?If you have any questions or concerns for your doctor, please call our main line at [phone] press option 4 to reach your doctor's medical assistant. If no one answers, please leave a voicemail as directed and we will return your call as soon as possible. Messages left after 4 pm will be answered the following business day.  ? ?You may also send [place] message via MyChart. We typically respond to MyChart messages within 1-2 business days. ? ?For prescription refills, please ask your pharmacy to contact our office. Our fax number is [phone] ? ?If you have an urgent issue when the clinic is closed that cannot wait until the next business day, you can page your doctor at the number below.   ? ?Please note that while we do our best to be available for urgent issues outside of office hours, we are not available 24/7.  ? ?If you have an urgent issue and are unable to reach [place] you may choose to seek medical care at your doctor's office, retail clinic, urgent care center, or emergency room. ? ?If you have a medical emergency, please immediately call 911 or go to the emergency department. ? ?Pager Numbers ? ?- Dr. KNehemiah Massed [phone]? ?- Dr. MLaurence Ferrari [phone]? ?- Dr. SNicole Kindred [phone]? ?In the event of  inclement weather, please call our main line at [phone] an update on the status of any delays or closures. ? ?Dermatology Medication Tips: ?Please keep the boxes that topical medications come in in order to help keep track of the instructions about where and how to use these. Pharmacies typically print the medication instructions only on the boxes and not directly on the medication tubes.  ? ?If your medication is too expensive, please contact our office at [phone] 4 or send [place] message through [facility]  ? ?We are unable to tell what your co-pay for medications will be in advance as this is different depending on your insurance coverage. However, we may be able to find a  substitute medication at lower cost or fill out paperwork to get insurance to cover a needed medication.  ? ?If a prior authorization is required to get your medication covered by your insurance company, please allow [place] 1-2 business days to complete this process. ? ?Drug prices often vary depending on where the prescription is filled and some pharmacies may offer cheaper prices. ? ?The website www.goodrx.com contains coupons for medications through different pharmacies. The prices here do not account for what the cost may be with help from insurance (it may be cheaper with your insurance), but the website can give you the price if you did not use any insurance.  ?- You can print the associated coupon and take it with your prescription to the pharmacy.  ?- You may also stop by our office during regular business hours and pick up a GoodRx coupon card.  ?- If you need your prescription sent electronically to a different pharmacy, notify our office through [facility] or by phone at [phone] option 4. ? ? ? ? ?Si Usted Necesita Algo Despu?s de Su Visita ? ?Tambi?n puede enviarnos un mensaje a trav?s de MyChart. Por lo general respondemos a los mensajes de MyChart en el transcurso de 1 a 2 d?as h?biles. ? ?Para renovar  recetas, por favor pida a su farmacia que se ponga en contacto con nuestra oficina. Nuestro n?mero de fax es el [phone]. ? ?Si tiene un asunto urgente cuando la cl?nica est? cerrada y que no puede e

## 2021-11-22 ENCOUNTER — Encounter: Payer: Self-pay | Admitting: Internal Medicine

## 2021-11-22 ENCOUNTER — Ambulatory Visit (INDEPENDENT_AMBULATORY_CARE_PROVIDER_SITE_OTHER): Payer: Medicare Other | Admitting: Internal Medicine

## 2021-11-22 VITALS — BP 132/76 | HR 83 | Temp 98.0°F | Ht 62.0 in | Wt 178.8 lb

## 2021-11-22 DIAGNOSIS — F430 Acute stress reaction: Secondary | ICD-10-CM

## 2021-11-22 DIAGNOSIS — Z0000 Encounter for general adult medical examination without abnormal findings: Secondary | ICD-10-CM | POA: Diagnosis not present

## 2021-11-22 DIAGNOSIS — E538 Deficiency of other specified B group vitamins: Secondary | ICD-10-CM

## 2021-11-22 DIAGNOSIS — F411 Generalized anxiety disorder: Secondary | ICD-10-CM

## 2021-11-22 DIAGNOSIS — E785 Hyperlipidemia, unspecified: Secondary | ICD-10-CM

## 2021-11-22 DIAGNOSIS — F32A Depression, unspecified: Secondary | ICD-10-CM

## 2021-11-22 DIAGNOSIS — I10 Essential (primary) hypertension: Secondary | ICD-10-CM | POA: Diagnosis not present

## 2021-11-22 DIAGNOSIS — Z1231 Encounter for screening mammogram for malignant neoplasm of breast: Secondary | ICD-10-CM

## 2021-11-22 MED ORDER — ALPRAZOLAM 0.25 MG PO TABS: ORAL_TABLET | ORAL | 5 refills | Status: DC

## 2021-11-22 MED ORDER — AMLODIPINE BESYLATE 5 MG PO TABS: 5.0000 mg | ORAL_TABLET | Freq: Every day | ORAL | 1 refills | Status: DC

## 2021-11-22 NOTE — Patient Instructions (Signed)
Increase your amlodipine dose to 5 mg daily.  Let me know if readings drop below 110/70 ? ?Congrats on the weight loss !  ? ? ?Your annual mammogram has been ordered.  You are encouraged (required) to call to make your appointment at [facility]  ? ? ?

## 2021-11-22 NOTE — Progress Notes (Signed)
Patient ID: Kara Harvey, female    DOB: 03-14-1941  Age: 81 y.o. MRN: [id number] ? ?The patient is here for annual preventive examination and management of other chronic and acute problems. ?  ?The risk factors are reflected in the social history. ? ?The roster of all physicians providing medical care to patient - is listed in the Snapshot section of the chart. ? ?Activities of daily living:  The patient is 100% independent in all ADLs: dressing, toileting, feeding as well as independent mobility ? ?Home safety : The patient has smoke detectors in the home. They wear seatbelts.  There are no firearms at home. There is no violence in the home.  ? ?There is no risks for hepatitis, STDs or HIV. There is no   history of blood transfusion. They have no travel history to infectious disease endemic areas of the world. ? ?The patient has seen their dentist in the last six month. They have seen their eye doctor in the last year. They admit to slight hearing difficulty with regard to whispered voices and some television programs.  They have deferred audiologic testing in the last year.  They do not  have excessive sun exposure. Discussed the need for sun protection: hats, long sleeves and use of sunscreen if there is significant sun exposure.  ? ?Diet: the importance of a healthy diet is discussed. They do have a healthy diet. ? ?The benefits of regular aerobic exercise were discussed. She walks infrequently .  ? ?Depression screen: there are no signs or vegative symptoms of depression- irritability, change in appetite, anhedonia, sadness/tearfullness. ? ?Cognitive assessment: the patient manages all their financial and personal affairs and is actively engaged. They could relate day,date,year and events; recalled 2/3 objects at 3 minutes; performed clock-face test normally. ? ?The following portions of the patient's history were reviewed and updated as appropriate: allergies, current medications, past family history, past  medical history,  past surgical history, past social history  and problem list. ? ?Visual acuity was not assessed per patient preference since she has regular follow up with her ophthalmologist. Hearing and body mass index were assessed and reviewed.  ? ?During the course of the visit the patient was educated and counseled about appropriate screening and preventive services including : fall prevention , diabetes screening, nutrition counseling, colorectal cancer screening, and recommended immunizations.   ? ?CC: The primary encounter diagnosis was Breast cancer screening by mammogram. Diagnoses of B12 deficiency, Essential hypertension, benign, Hyperlipidemia LDL goal <130, Encounter for preventive health examination, Anxiety as acute reaction to gross stress, and Depressive disorder were also pertinent to this visit. ? ? ?1) HTN:  range of readings at home have been 134 to 155/ 70 to 80. She has been taking an extra dose of 2.5 mg amlodipine ? ?2) HM:  Derm annual  exam .yesterday.  Overdue for eye exam (  6 yrs ago cataract surgery).  Hearing fine  ? ? ?History ?Kara Harvey has a past medical history of Closed malleolar fracture, unspecified laterality, with routine healing, subsequent encounter (03/04/2014), Fracture of finger, left, closed (04/28/2017), Humerus fracture (07/13/2017), Hyperlipidemia, and Hypertension.  ? ?She has a past surgical history that includes Fracture surgery (Left, 01/14/2011); Tonsilectomy, adenoidectomy, bilateral myringotomy and tubes (Bilateral, 1957); Tubal ligation (y-37); Breast surgery (Left, 1986); Eye surgery (Left, 6wks ago); and Breast excisional biopsy (Left, 1988).  ? ?Her family history includes Alzheimer's disease in her father; Arthritis in her father; Cancer in her mother; Colon cancer in her paternal  aunt; Hyperlipidemia in her mother.She reports that she quit smoking about 42 years ago. Her smoking use included cigarettes. She smoked an average of 2 packs per day. She has  never used smokeless tobacco. She reports current alcohol use of about 5.0 standard drinks per week. She reports that she does not use drugs. ? ?Outpatient Medications Prior to Visit  ?Medication Sig Dispense Refill  ? atorvastatin (LIPITOR) 20 MG tablet TAKE 1 TABLET(20 MG) BY MOUTH DAILY 90 tablet 1  ? buPROPion ER (WELLBUTRIN SR) 100 MG 12 hr tablet TAKE 1 TABLET(100 MG) BY MOUTH TWICE DAILY 60 tablet 5  ? calcium carbonate (TUMS - DOSED IN MG ELEMENTAL CALCIUM) 500 MG chewable tablet Chew 1 tablet (200 mg of elemental calcium total) by mouth 3 (three) times daily as needed for indigestion or heartburn. 30 tablet 0  ? Cholecalciferol (VITAMIN D3) 1000 UNITS CAPS Take 1,000 Units by mouth daily.    ? famotidine (PEPCID) 20 MG tablet Take 20 mg by mouth 2 (two) times daily.    ? losartan (COZAAR) 100 MG tablet TAKE 1 TABLET(100 MG) BY MOUTH DAILY 90 tablet 1  ? valACYclovir (VALTREX) 1000 MG tablet TAKE 2 TABLETS(2000 MG) BY MOUTH TWICE DAILY 20 tablet 0  ? ALPRAZolam (XANAX) 0.25 MG tablet TAKE 1 TABLET(0.25 MG) BY MOUTH DAILY AS NEEDED FOR ANXIETY 20 tablet 0  ? amLODipine (NORVASC) 2.5 MG tablet TAKE 1 TABLET(2.5 MG) BY MOUTH DAILY 90 tablet 1  ? ?Facility-Administered Medications Prior to Visit  ?Medication Dose Route Frequency Provider Last Rate Last Admin  ? cyanocobalamin ((VITAMIN B-12)) injection 1,000 mcg  1,000 mcg Intramuscular Once Crecencio Mc, MD      ? ? ?Review of Systems ? ?Patient denies headache, fevers, malaise, unintentional weight loss, skin rash, eye pain, sinus congestion and sinus pain, sore throat, dysphagia,  hemoptysis , cough, dyspnea, wheezing, chest pain, palpitations, orthopnea, edema, abdominal pain, nausea, melena, diarrhea, constipation, flank pain, dysuria, hematuria, urinary  Frequency, nocturia, numbness, tingling, seizures,  Focal weakness, Loss of consciousness,  Tremor, insomnia, depression, anxiety, and suicidal ideation.    ? ?Objective:  ?BP 132/76 (BP Location: Left  Arm, Patient Position: Sitting, Cuff Size: Normal)   Pulse 83   Temp 98 ?F (36.7 ?C) (Oral)   Ht '5\' 2"'$  (1.575 m)   Wt 178 lb 12.8 oz (81.1 kg)   SpO2 95%   BMI 32.70 kg/m?  ? ?Physical Exam ? ?General appearance: alert, cooperative and appears stated age ?Ears: normal TM's and external ear canals both ears ?Throat: lips, mucosa, and tongue normal; teeth and gums normal ?Neck: no adenopathy, no carotid bruit, supple, symmetrical, trachea midline and thyroid not enlarged, symmetric, no tenderness/mass/nodules ?Back: symmetric, no curvature. ROM normal. No CVA tenderness. ?Lungs: clear to auscultation bilaterally ?Heart: regular rate and rhythm, S1, S2 normal, no murmur, click, rub or gallop ?Abdomen: soft, non-tender; bowel sounds normal; no masses,  no organomegaly ?Pulses: 2+ and symmetric ?Skin: Skin color, texture, turgor normal. No rashes or lesions ?Lymph nodes: Cervical, supraclavicular, and axillary nodes normal.  ? ?Assessment & Plan:  ? ?Problem List Items Addressed This Visit   ? ? Encounter for preventive health examination  ?  age appropriate education and counseling updated, referrals for preventative services and immunizations addressed, dietary and smoking counseling addressed, most recent labs reviewed.  I have personally reviewed and have noted: ?  ?1) the patient's medical and social history ?2) The pt's use of alcohol, tobacco, and illicit drugs ?3) The  patient's current medications and supplements ?4) Functional ability including ADL's, fall risk, home safety risk, hearing and visual impairment ?5) Diet and physical activities ?6) Evidence for depression or mood disorder ?7) The patient's height, weight, and BMI have been recorded in the chart ?  ?I have made referrals, and provided counseling and education based on review of the above ?  ?  ? Anxiety as acute reaction to gross stress  ?   She has frequent insomnia managed with  prn alprazolam. The risks and benefits of benzodiazepine use  were reviewed with patient today including excessive sedation leading to respiratory depression,  impaired thinking/driving, and addiction.  Patient was advised to avoid concurrent use with alcohol, to use medic

## 2021-11-22 NOTE — Assessment & Plan Note (Signed)

## 2021-11-23 LAB — LIPID PANEL
Cholesterol: 162 mg/dL (ref 0–200)
HDL: 79.7 mg/dL (ref >39.00)
LDL Cholesterol: 69 mg/dL (ref 0–99)
NonHDL: 82.35
Total CHOL/HDL Ratio: 2
Triglycerides: 66 mg/dL (ref 0.0–149.0)
VLDL: 13.2 mg/dL (ref 0.0–40.0)

## 2021-11-23 LAB — COMPREHENSIVE METABOLIC PANEL
ALT: 13 U/L (ref 0–35)
AST: 13 U/L (ref 0–37)
Albumin: 4.3 g/dL (ref 3.5–5.2)
Alkaline Phosphatase: 77 U/L (ref 39–117)
BUN: 17 mg/dL (ref 6–23)
CO2: 25 mEq/L (ref 19–32)
Calcium: 9.4 mg/dL (ref 8.4–10.5)
Chloride: 102 mEq/L (ref 96–112)
Creatinine, Ser: 0.77 mg/dL (ref 0.40–1.20)
GFR: 73.4 mL/min (ref >60.00)
Glucose, Bld: 91 mg/dL (ref 70–99)
Potassium: 4 mEq/L (ref 3.5–5.1)
Sodium: 137 mEq/L (ref 135–145)
Total Bilirubin: 0.5 mg/dL (ref 0.2–1.2)
Total Protein: 6.5 g/dL (ref 6.0–8.3)

## 2021-11-23 LAB — VITAMIN B12: Vitamin B-12: 399 pg/mL (ref 211–911)

## 2021-11-23 NOTE — Assessment & Plan Note (Signed)
Well controlled on current  Of amlodipine and losartan . Renal function stable, no changes today. ? ?Lab Results  ?Component Value Date  ? CREATININE 0.81 06/24/2020  ? ?Lab Results  ?Component Value Date  ? NA 138 06/24/2020  ? K 4.3 06/24/2020  ? CL 100 06/24/2020  ? CO2 27 06/24/2020  ? ? ?

## 2021-11-23 NOTE — Assessment & Plan Note (Signed)
She has frequent insomnia managed with  prn alprazolam. The risks and benefits of benzodiazepine use were reviewed with patient today including excessive sedation leading to respiratory depression,  impaired thinking/driving, and addiction.  Patient was advised to avoid concurrent use with alcohol, to use medication only as needed and not to share with others  .  ?

## 2021-11-23 NOTE — Assessment & Plan Note (Signed)
Managed with atorvastatin . Fasting lipids are at goal and LFTs are normal.  No changes today  ? ? ?Lab Results  ?Component Value Date  ? CHOL 182 06/24/2020  ? HDL 84 06/24/2020  ? Randall 81 06/24/2020  ? LDLDIRECT 135.1 09/03/2013  ? TRIG 88 06/24/2020  ? CHOLHDL 2.2 06/24/2020  ? ?Lab Results  ?Component Value Date  ? ALT 13 06/24/2020  ? AST 15 06/24/2020  ? ALKPHOS 82 11/17/2019  ? BILITOT 0.4 06/24/2020  ? ? ?

## 2021-11-23 NOTE — Assessment & Plan Note (Signed)
Her symptoms remain well  controlled on wellbutrin , and she is using alprazolam sparingly for insomnia .  ?

## 2021-11-25 LAB — METHYLMALONIC ACID, SERUM: Methylmalonic Acid, Quant: 245 nmol/L (ref 87–318)

## 2022-01-31 ENCOUNTER — Other Ambulatory Visit: Payer: Self-pay

## 2022-01-31 MED ORDER — ATORVASTATIN CALCIUM 20 MG PO TABS: ORAL_TABLET | ORAL | 3 refills | Status: DC

## 2022-03-06 ENCOUNTER — Other Ambulatory Visit: Payer: Self-pay | Admitting: Internal Medicine

## 2022-03-06 ENCOUNTER — Ambulatory Visit: Payer: Medicare Other

## 2022-03-06 DIAGNOSIS — Z1231 Encounter for screening mammogram for malignant neoplasm of breast: Secondary | ICD-10-CM

## 2022-03-15 ENCOUNTER — Other Ambulatory Visit: Payer: Self-pay | Admitting: Internal Medicine

## 2022-03-27 ENCOUNTER — Ambulatory Visit
Admission: RE | Admit: 2022-03-27 | Discharge: 2022-03-27 | Disposition: A | Discharge status: Home / Self Care | Payer: Medicare Other | Source: Ambulatory Visit | Attending: Internal Medicine | Admitting: Internal Medicine

## 2022-03-27 DIAGNOSIS — Z1231 Encounter for screening mammogram for malignant neoplasm of breast: Secondary | ICD-10-CM

## 2022-03-29 ENCOUNTER — Other Ambulatory Visit: Payer: Self-pay | Admitting: Internal Medicine

## 2022-03-29 DIAGNOSIS — R928 Other abnormal and inconclusive findings on diagnostic imaging of breast: Secondary | ICD-10-CM

## 2022-04-05 ENCOUNTER — Ambulatory Visit
Admission: RE | Admit: 2022-04-05 | Discharge: 2022-04-05 | Disposition: A | Discharge status: Home / Self Care | Payer: Medicare Other | Source: Ambulatory Visit | Attending: Internal Medicine | Admitting: Internal Medicine

## 2022-04-05 ENCOUNTER — Other Ambulatory Visit: Payer: Self-pay | Admitting: Internal Medicine

## 2022-04-05 DIAGNOSIS — N6320 Unspecified lump in the left breast, unspecified quadrant: Secondary | ICD-10-CM

## 2022-04-05 DIAGNOSIS — R928 Other abnormal and inconclusive findings on diagnostic imaging of breast: Secondary | ICD-10-CM

## 2022-04-05 DIAGNOSIS — R922 Inconclusive mammogram: Secondary | ICD-10-CM | POA: Diagnosis not present

## 2022-04-05 DIAGNOSIS — N6322 Unspecified lump in the left breast, upper inner quadrant: Secondary | ICD-10-CM | POA: Diagnosis not present

## 2022-04-05 DIAGNOSIS — N6489 Other specified disorders of breast: Secondary | ICD-10-CM

## 2022-04-08 DIAGNOSIS — N6320 Unspecified lump in the left breast, unspecified quadrant: Secondary | ICD-10-CM | POA: Insufficient documentation

## 2022-04-11 ENCOUNTER — Ambulatory Visit
Admission: RE | Admit: 2022-04-11 | Discharge: 2022-04-11 | Disposition: A | Discharge status: Home / Self Care | Payer: Medicare Other | Source: Ambulatory Visit | Attending: Internal Medicine | Admitting: Internal Medicine

## 2022-04-11 DIAGNOSIS — N6489 Other specified disorders of breast: Secondary | ICD-10-CM

## 2022-04-11 DIAGNOSIS — N6322 Unspecified lump in the left breast, upper inner quadrant: Secondary | ICD-10-CM | POA: Diagnosis not present

## 2022-04-11 DIAGNOSIS — N6321 Unspecified lump in the left breast, upper outer quadrant: Secondary | ICD-10-CM | POA: Diagnosis not present

## 2022-04-11 DIAGNOSIS — N6320 Unspecified lump in the left breast, unspecified quadrant: Secondary | ICD-10-CM

## 2022-04-11 DIAGNOSIS — C50212 Malignant neoplasm of upper-inner quadrant of left female breast: Secondary | ICD-10-CM | POA: Diagnosis not present

## 2022-04-11 DIAGNOSIS — N6011 Diffuse cystic mastopathy of right breast: Secondary | ICD-10-CM | POA: Diagnosis not present

## 2022-04-11 HISTORY — PX: BREAST BIOPSY: SHX20

## 2022-04-12 ENCOUNTER — Telehealth: Payer: Self-pay

## 2022-04-12 NOTE — Telephone Encounter (Signed)
Patient states that she would like for Dr. Deborra Medina to please call her regarding breast cancer.

## 2022-04-12 NOTE — Telephone Encounter (Signed)
Pt is scheduled to see you tomorrow morning

## 2022-04-13 ENCOUNTER — Encounter: Payer: Self-pay | Admitting: Internal Medicine

## 2022-04-13 ENCOUNTER — Ambulatory Visit (INDEPENDENT_AMBULATORY_CARE_PROVIDER_SITE_OTHER): Payer: Medicare Other | Admitting: Internal Medicine

## 2022-04-13 VITALS — BP 152/84 | HR 95 | Temp 98.1°F | Ht 62.0 in | Wt 180.4 lb

## 2022-04-13 DIAGNOSIS — I10 Essential (primary) hypertension: Secondary | ICD-10-CM | POA: Diagnosis not present

## 2022-04-13 DIAGNOSIS — Z23 Encounter for immunization: Secondary | ICD-10-CM

## 2022-04-13 DIAGNOSIS — C50412 Malignant neoplasm of upper-outer quadrant of left female breast: Secondary | ICD-10-CM | POA: Diagnosis not present

## 2022-04-13 MED ORDER — ALPRAZOLAM 0.5 MG PO TABS: ORAL_TABLET | ORAL | 1 refills | Status: DC

## 2022-04-13 MED ORDER — ONDANSETRON HCL 4 MG PO TABS: 4.0000 mg | ORAL_TABLET | Freq: Three times a day (TID) | ORAL | 0 refills | Status: DC | PRN

## 2022-04-13 NOTE — Assessment & Plan Note (Signed)
Invasive lobular carcinoma Grade 2.  Surgical consult has been arranged by [facility] for Oct 2.  urgent heme onc referral made

## 2022-04-13 NOTE — Patient Instructions (Addendum)
I have increased the alprazolam dose to 0.5 mg and increased the quantity to two daily if needed   Your appointment is on Monday with an excellent surgeon at [facility]    I have made an urgent  referral to the local hematology/oncology group at [facility] . They will coordinate with Dr Brantley Stage

## 2022-04-13 NOTE — Assessment & Plan Note (Signed)
ELEVATED TODAY due to receiving the news yesterday that she has  breast cancer .  No changes to meds today  increase alprazolam to use prn

## 2022-04-13 NOTE — Progress Notes (Signed)
Subjective:  Patient ID: Kara Harvey, female    DOB: [birth year]  Age: 81 y.o. MRN: [id number]  CC: The primary encounter diagnosis was Malignant neoplasm of upper-outer quadrant of left female breast, unspecified estrogen receptor status ([place]). A diagnosis of Essential hypertension, benign was also pertinent to this visit.   HPI SEFORA TIETJE presents for follow up on left breast biopsy done Sept 27 which was positive for invasive lobular carcinoma Grade 2 . . The biopsy was done after having an abnormal mammogram on Sept 21.  She  was notified on Sept 28 by the [facility] with the results,  and has been referred to a general surgeon and has an appt on Monday with surgeon Tobe Sos at [facility] [place]  There is no FH of breast CA .  (Mother had Lung CA,  paternal aunt colon CA etc but no breast)  She is understandably anxious and bp has been elevated . Using alprazolam prn. Her husband and son rely non her tremendously .   She has concerns about transportation to treatments  since her husband cannot drive since his stroke . Her support system is somewhat strained .  Son Liliane Channel has bipolar disorder, but he has frequent manic episodes . She feels she cannot rely on him for transportation , but feels she can leave him home with Liliane Channel.       Outpatient Medications Prior to Visit  Medication Sig Dispense Refill   amLODipine (NORVASC) 5 MG tablet Take 1 tablet (5 mg total) by mouth daily. 90 tablet 1   atorvastatin (LIPITOR) 20 MG tablet TAKE 1 TABLET(20 MG) BY MOUTH DAILY 90 tablet 3   buPROPion ER (WELLBUTRIN SR) 100 MG 12 hr tablet TAKE 1 TABLET(100 MG) BY MOUTH TWICE DAILY 60 tablet 5   calcium carbonate (TUMS - DOSED IN MG ELEMENTAL CALCIUM) 500 MG chewable tablet Chew 1 tablet (200 mg of elemental calcium total) by mouth 3 (three) times daily as needed for indigestion or heartburn. 30 tablet 0   Cholecalciferol (VITAMIN D3) 1000 UNITS CAPS Take 1,000 Units by  mouth daily.     famotidine (PEPCID) 20 MG tablet Take 20 mg by mouth 2 (two) times daily.     losartan (COZAAR) 100 MG tablet TAKE 1 TABLET(100 MG) BY MOUTH DAILY 90 tablet 1   valACYclovir (VALTREX) 1000 MG tablet TAKE 2 TABLETS(2000 MG) BY MOUTH TWICE DAILY 20 tablet 0   ALPRAZolam (XANAX) 0.25 MG tablet TAKE 1 TABLET(0.25 MG) BY MOUTH DAILY AS NEEDED FOR ANXIETY 20 tablet 5   Facility-Administered Medications Prior to Visit  Medication Dose Route Frequency Provider Last Rate Last Admin   cyanocobalamin ((VITAMIN B-12)) injection 1,000 mcg  1,000 mcg Intramuscular Once Crecencio Mc, MD        Review of Systems;  Patient denies headache, fevers, malaise, unintentional weight loss, skin rash, eye pain, sinus congestion and sinus pain, sore throat, dysphagia,  hemoptysis , cough, dyspnea, wheezing, chest pain, palpitations, orthopnea, edema, abdominal pain, nausea, melena, diarrhea, constipation, flank pain, dysuria, hematuria, urinary  Frequency, nocturia, numbness, tingling, seizures,  Focal weakness, Loss of consciousness,  Tremor, insomnia, depression, anxiety, and suicidal ideation.      Objective:  BP (!) 152/84 (BP Location: Left Arm, Patient Position: Sitting, Cuff Size: Normal)   Pulse 95   Temp 98.1 F (36.7 C) (Oral)   Ht 5' 2"  (1.575 m)   Wt 180 lb 6.4 oz (81.8 kg)   SpO2 96%  BMI 33.00 kg/m   BP Readings from Last 3 Encounters:  04/13/22 (!) 152/84  11/22/21 132/76  10/23/21 135/68    Wt Readings from Last 3 Encounters:  04/13/22 180 lb 6.4 oz (81.8 kg)  11/22/21 178 lb 12.8 oz (81.1 kg)  10/23/21 189 lb (85.7 kg)    General appearance: alert, cooperative and appears stated age Ears: normal TM's and external ear canals both ears Throat: lips, mucosa, and tongue normal; teeth and gums normal Neck: no adenopathy, no carotid bruit, supple, symmetrical, trachea midline and thyroid not enlarged, symmetric, no tenderness/mass/nodules Back: symmetric, no  curvature. ROM normal. No CVA tenderness. Lungs: clear to auscultation bilaterally Heart: regular rate and rhythm, S1, S2 normal, no murmur, click, rub or gallop Breast:  left breast with 2 biopsy sites covered with steri strips  no signs of cellulitis  Abdomen: soft, non-tender; bowel sounds normal; no masses,  no organomegaly Pulses: 2+ and symmetric Skin: Skin color, texture, turgor normal. No rashes or lesions Lymph nodes: Cervical, supraclavicular, and axillary nodes normal. Neuro:  awake and interactive with normal mood and affect. Higher cortical functions are normal. Speech is clear without word-finding difficulty or dysarthria. Extraocular movements are intact. Visual fields of both eyes are grossly intact. Sensation to light touch is grossly intact bilaterally of upper and lower extremities. Motor examination shows 4+/5 symmetric hand grip and upper extremity and 5/5 lower extremity strength. There is no pronation or drift. Gait is non-ataxic   No results found for: "HGBA1C"  Lab Results  Component Value Date   CREATININE 0.77 11/22/2021   CREATININE 0.81 06/24/2020   CREATININE 0.74 11/17/2019    Lab Results  Component Value Date   WBC 8.7 10/13/2019   HGB 14.1 10/13/2019   HCT 43.0 10/13/2019   PLT 278 10/13/2019   GLUCOSE 91 11/22/2021   CHOL 162 11/22/2021   TRIG 66.0 11/22/2021   HDL 79.70 11/22/2021   LDLDIRECT 135.1 09/03/2013   LDLCALC 69 11/22/2021   ALT 13 11/22/2021   AST 13 11/22/2021   NA 137 11/22/2021   K 4.0 11/22/2021   CL 102 11/22/2021   CREATININE 0.77 11/22/2021   BUN 17 11/22/2021   CO2 25 11/22/2021   TSH 2.29 06/24/2020   MICROALBUR 0.1 09/03/2013    MM CLIP PLACEMENT LEFT  Result Date: 04/11/2022 CLINICAL DATA:  Status post ultrasound-guided core needle biopsy of a 1.7 cm mass in the 11:30 o'clock position of the left breast, marked with a ribbon shaped biopsy marker clip and a 1.4 cm mass in the 1 o'clock position of the left breast,  marked with a coil shaped biopsy marker clip. EXAM: 3D DIAGNOSTIC LEFT MAMMOGRAM POST ULTRASOUND BIOPSY X 2 COMPARISON:  Previous exam(s). FINDINGS: 3D Mammographic images were obtained following ultrasound guided biopsy of a 1.7 cm mass in the 11:30 o'clock position of the left breast and a 1.4 cm mass in the 1 o'clock position of the left breast. The biopsy marking clips are in expected positions at the sites of biopsy. IMPRESSION: 1. Appropriate positioning of the ribbon shaped biopsy marking clip at the site of biopsy in the 11:30 o'clock position of the left breast at the location of the 1.7 cm biopsied mass. This corresponds to the distortion with an associated small number of punctate rounded calcifications seen mammographically. 2. Appropriate positioning of the coil shaped biopsy marker clip at the location of the mass biopsied in the 1 o'clock position of the left breast with no mammographic correlate. Final  Assessment: Post Procedure Mammograms for Marker Placement Electronically Signed   By: Claudie Revering M.D.   On: 04/11/2022 13:33  [place] LT BREAST BX W LOC DEV 1ST LESION IMG BX SPEC US GUIDE  Result Date: 04/11/2022 CLINICAL DATA:  1.7 cm mass in the 11:30 o'clock position of the left breast and 1.4 cm mass in the 1 o'clock position of the left breast with recent imaging features suspicious for malignancy. EXAM: ULTRASOUND GUIDED LEFT BREAST CORE NEEDLE BIOPSY X 2 COMPARISON:  Previous exam(s). PROCEDURE: I met with the patient and we discussed the procedure of ultrasound-guided biopsy, including benefits and alternatives. We discussed the high likelihood of successful procedures. We discussed the risks of the procedures, including infection, bleeding, tissue injury, clip migration, and inadequate sampling. Informed written consent was given. The usual time-out protocol was performed immediately prior to the procedures. SITE 1: 1.7 CM MASS IN THE 11:30 O'CLOCK POSITION OF THE LEFT BREAST Lesion  quadrant: Upper inner quadrant Using sterile technique and 1% Lidocaine as local anesthetic, under direct ultrasound visualization, a 12 gauge spring-loaded device was used to perform biopsy of the recently demonstrated 1.7 cm mass in the 11:30 o'clock position of the left breast, 8 cm from the nipple, using an inferolateral approach. At the conclusion of the procedure a ribbon shaped tissue marker clip was deployed into the biopsy cavity. SITE 2: 1.4 CM MASS IN THE 1 O'CLOCK POSITION OF THE LEFT BREAST Lesion quadrant: Upper outer quadrant Using sterile technique and 1% Lidocaine as local anesthetic, under direct ultrasound visualization, a 12 gauge spring-loaded device was used to perform biopsy of the recently demonstrated 1.4 cm mass in the 1 o'clock position of the left breast, 10 cm from the nipple, using an inferolateral approach. At the conclusion of the procedure a coil shaped tissue marker clip was deployed into the biopsy cavity. Follow up 2 view mammogram was performed and dictated separately. IMPRESSION: Ultrasound guided biopsy of a 1.7 cm mass in the 11:30 o'clock position of the left breast and a 1.4 cm mass in the 1 o'clock position of the left breast. No apparent complications. Electronically Signed   By: Claudie Revering M.D.   On: 04/11/2022 12:38  [place] LT BREAST BX W LOC DEV EA ADD LESION IMG BX SPEC US GUIDE  Result Date: 04/11/2022 CLINICAL DATA:  1.7 cm mass in the 11:30 o'clock position of the left breast and 1.4 cm mass in the 1 o'clock position of the left breast with recent imaging features suspicious for malignancy. EXAM: ULTRASOUND GUIDED LEFT BREAST CORE NEEDLE BIOPSY X 2 COMPARISON:  Previous exam(s). PROCEDURE: I met with the patient and we discussed the procedure of ultrasound-guided biopsy, including benefits and alternatives. We discussed the high likelihood of successful procedures. We discussed the risks of the procedures, including infection, bleeding, tissue injury, clip  migration, and inadequate sampling. Informed written consent was given. The usual time-out protocol was performed immediately prior to the procedures. SITE 1: 1.7 CM MASS IN THE 11:30 O'CLOCK POSITION OF THE LEFT BREAST Lesion quadrant: Upper inner quadrant Using sterile technique and 1% Lidocaine as local anesthetic, under direct ultrasound visualization, a 12 gauge spring-loaded device was used to perform biopsy of the recently demonstrated 1.7 cm mass in the 11:30 o'clock position of the left breast, 8 cm from the nipple, using an inferolateral approach. At the conclusion of the procedure a ribbon shaped tissue marker clip was deployed into the biopsy cavity. SITE 2: 1.4 CM MASS IN THE 1  O'CLOCK POSITION OF THE LEFT BREAST Lesion quadrant: Upper outer quadrant Using sterile technique and 1% Lidocaine as local anesthetic, under direct ultrasound visualization, a 12 gauge spring-loaded device was used to perform biopsy of the recently demonstrated 1.4 cm mass in the 1 o'clock position of the left breast, 10 cm from the nipple, using an inferolateral approach. At the conclusion of the procedure a coil shaped tissue marker clip was deployed into the biopsy cavity. Follow up 2 view mammogram was performed and dictated separately. IMPRESSION: Ultrasound guided biopsy of a 1.7 cm mass in the 11:30 o'clock position of the left breast and a 1.4 cm mass in the 1 o'clock position of the left breast. No apparent complications. Electronically Signed   By: Claudie Revering M.D.   On: 04/11/2022 12:38   Assessment & Plan:   Problem List Items Addressed This Visit     Essential hypertension, benign    ELEVATED TODAY due to receiving the news yesterday that she has  breast cancer .  No changes to meds today  increase alprazolam to use prn       Malignant neoplasm of upper-outer quadrant of left female breast ([place]) - Primary    Invasive lobular carcinoma Grade 2.  Surgical consult has been arranged by [facility] for Oct 2.   urgent heme onc referral made       Relevant Medications   ALPRAZolam (XANAX) 0.5 MG tablet   ondansetron (ZOFRAN) 4 MG tablet   Other Relevant Orders   Ambulatory referral to Hematology / Oncology    I spent a total of  40  minutes with this patient in a face to face visit on the date of this encounter reviewing her mammogram and biopsy results,  counselling on  the diagnosis and treatment , reviewing  home blood pressure /blod sugar readings,  and post visit ordering of testing and therapeutics.    Follow-up: Return in about 4 weeks (around 05/11/2022).   Crecencio Mc, MD

## 2022-04-16 ENCOUNTER — Ambulatory Visit: Payer: Self-pay | Admitting: Surgery

## 2022-04-16 ENCOUNTER — Other Ambulatory Visit: Payer: Self-pay | Admitting: *Deleted

## 2022-04-16 DIAGNOSIS — Z170 Estrogen receptor positive status [ER+]: Secondary | ICD-10-CM | POA: Diagnosis not present

## 2022-04-16 DIAGNOSIS — C50412 Malignant neoplasm of upper-outer quadrant of left female breast: Secondary | ICD-10-CM | POA: Insufficient documentation

## 2022-04-17 ENCOUNTER — Telehealth: Payer: Self-pay | Admitting: Hematology and Oncology

## 2022-04-17 ENCOUNTER — Other Ambulatory Visit: Payer: Self-pay | Admitting: Surgery

## 2022-04-17 DIAGNOSIS — H2511 Age-related nuclear cataract, right eye: Secondary | ICD-10-CM | POA: Diagnosis not present

## 2022-04-17 DIAGNOSIS — Z171 Estrogen receptor negative status [ER-]: Secondary | ICD-10-CM

## 2022-04-17 DIAGNOSIS — C50412 Malignant neoplasm of upper-outer quadrant of left female breast: Secondary | ICD-10-CM

## 2022-04-17 NOTE — Telephone Encounter (Signed)
Scheduled appointment per 10/02. Patient is aware of appointment date and time. Patient is aware to arrive 15 mins prior to appointment time and to bring updated insurance cards. Patient is aware of location.   

## 2022-04-18 ENCOUNTER — Inpatient Hospital Stay: Payer: Medicare Other

## 2022-04-18 ENCOUNTER — Inpatient Hospital Stay: Payer: Medicare Other | Admitting: Oncology

## 2022-04-19 ENCOUNTER — Telehealth (INDEPENDENT_AMBULATORY_CARE_PROVIDER_SITE_OTHER): Payer: Medicare Other | Admitting: Family Medicine

## 2022-04-19 ENCOUNTER — Encounter: Payer: Self-pay | Admitting: Family Medicine

## 2022-04-19 VITALS — Ht 62.0 in | Wt 180.0 lb

## 2022-04-19 DIAGNOSIS — U071 COVID-19: Secondary | ICD-10-CM

## 2022-04-19 MED ORDER — MOLNUPIRAVIR EUA 200MG CAPSULE: 4.0000 | ORAL_CAPSULE | Freq: Two times a day (BID) | ORAL | 0 refills | Status: AC

## 2022-04-19 NOTE — Progress Notes (Signed)
VIRTUAL VISIT A virtual visit is felt to be most appropriate for this patient at this time.   I connected with the patient on 04/19/22 at  8:20 AM EDT by virtual telehealth platform and verified that I am speaking with the correct person using two identifiers.   I discussed the limitations, risks, security and privacy concerns of performing an evaluation and management service by  virtual telehealth platform and the availability of in person appointments. I also discussed with the patient that there may be a patient responsible charge related to this service. The patient expressed understanding and agreed to proceed.  Patient location: Home Provider Location: [facility] Participants: Eliezer Lofts and [name]   Chief Complaint  Patient presents with   Covid Positive    Home test positive yesterday. Symptoms started 10/3.     History of Present Illness: 81 year old female patient of Dr Derrel Nip with history of  HTN and current breast cancer presents with COVID 19  Date of onset: 04/17/22  She took a COVID test at home 10/4.Marland Kitchen positive   She reports initial symptoms of nasal congestion, ST.  Started with Flonase and Claritin D.. symptoms continue to include cough, dry and productive. Possible subjective fever, but not sure thermometer not working well, no chilll. No ear pain, no face pain.  No SOB, no wheeze.   Had flu shot and COVID vaccine last week.   Has not started chemo. No chronic lung disease such as asthma and COPD.   Former remote smoker   She is using tylenol , ASA and saline.   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   GFR 73  Review of Systems  Constitutional:  Negative for chills and fever.  HENT:  Negative for congestion and ear pain.   Eyes:  Negative for pain and redness.  Respiratory:  Negative for cough and shortness of breath.   Cardiovascular:  Negative for chest pain, palpitations and leg swelling.  Gastrointestinal:  Negative for abdominal  pain, blood in stool, constipation, diarrhea, nausea and vomiting.  Genitourinary:  Negative for dysuria.  Musculoskeletal:  Negative for falls and myalgias.  Skin:  Negative for rash.  Neurological:  Negative for dizziness.  Psychiatric/Behavioral:  Negative for depression. The patient is not nervous/anxious.       Past Medical History:  Diagnosis Date   Closed malleolar fracture, unspecified laterality, with routine healing, subsequent encounter 03/04/2014   occured in  July 2013   Fracture of finger, left, closed 04/28/2017   Humerus fracture 07/13/2017   Hyperlipidemia    Hypertension     reports that she quit smoking about 43 years ago. Her smoking use included cigarettes. She smoked an average of 2 packs per day. She has never used smokeless tobacco. She reports current alcohol use of about 5.0 standard drinks of alcohol per week. She reports that she does not use drugs.   Current Outpatient Medications:    ALPRAZolam (XANAX) 0.5 MG tablet, TAKE 1 TABLET(0.25 MG) BY MOUTH DAILY AS NEEDED FOR ANXIETY, Disp: 60 tablet, Rfl: 1   amLODipine (NORVASC) 5 MG tablet, Take 1 tablet (5 mg total) by mouth daily., Disp: 90 tablet, Rfl: 1   atorvastatin (LIPITOR) 20 MG tablet, TAKE 1 TABLET(20 MG) BY MOUTH DAILY, Disp: 90 tablet, Rfl: 3   buPROPion ER (WELLBUTRIN SR) 100 MG 12 hr tablet, TAKE 1 TABLET(100 MG) BY MOUTH TWICE DAILY, Disp: 60 tablet, Rfl: 5   calcium carbonate (TUMS - DOSED IN MG  ELEMENTAL CALCIUM) 500 MG chewable tablet, Chew 1 tablet (200 mg of elemental calcium total) by mouth 3 (three) times daily as needed for indigestion or heartburn., Disp: 30 tablet, Rfl: 0   Cholecalciferol (VITAMIN D3) 1000 UNITS CAPS, Take 1,000 Units by mouth daily., Disp: , Rfl:    famotidine (PEPCID) 20 MG tablet, Take 20 mg by mouth 2 (two) times daily., Disp: , Rfl:    losartan (COZAAR) 100 MG tablet, TAKE 1 TABLET(100 MG) BY MOUTH DAILY, Disp: 90 tablet, Rfl: 1   ondansetron (ZOFRAN) 4 MG tablet,  Take 1 tablet (4 mg total) by mouth every 8 (eight) hours as needed for nausea or vomiting., Disp: 30 tablet, Rfl: 0   valACYclovir (VALTREX) 1000 MG tablet, TAKE 2 TABLETS(2000 MG) BY MOUTH TWICE DAILY, Disp: 20 tablet, Rfl: 0  Current Facility-Administered Medications:    cyanocobalamin ((VITAMIN B-12)) injection 1,000 mcg, 1,000 mcg, Intramuscular, Once, [name], [name], MD   Observations/Objective: Height '5\' 2"'$  (1.575 m), weight 180 lb (81.6 kg).  Physical Exam Constitutional:      General: The patient is not in acute distress. Pulmonary:     Effort: Pulmonary effort is normal. No respiratory distress.  Neurological:     Mental Status: The patient is alert and oriented to person, place, and time.  Psychiatric:        Mood and Affect: Mood normal.        Behavior: Behavior normal.     Assessment and Plan    Problem List Items Addressed This Visit     COVID-19 - Primary    COVID19  Infection < 5 days from onset of symptoms in  vaccinated overweight individual with history of  HTN and breast cancer.  No clear sign of bacterial infection at this time.   No SOB.  No red flags/need for ER visit or in-person exam at respiratory clinic at this time..    Pt higher risk for COVID complications given  HTN and breast cancer. GFR  71 but there are multiple medication contraindications. t opaxlovid  Start molnupiravir 5 day course. Reviewed course of medication and side effect profile with patient in detail.   Symptomatic care with mucinex, flonase, saline and cough suppressant at nigh as needed.t. If SOB begins symptoms worsening.. have low threshold for in-person exam, if severe shortness of breath ER visit recommended.  Can monitor Oxygen saturation at home with home monitor if able to obtain.  Go to ER if O2 sat < 90% on room air.   Reviewed home care and provided information. Recommended quarantine 5 days isolation recommended. Return to work day 6 and wear mask for 4 more days  to complete 10 days. Provided info about prevention of spread of COVID 19.      Relevant Medications   molnupiravir EUA (LAGEVRIO) 200 mg CAPS capsule     I discussed the assessment and treatment plan with the patient. The patient was provided an opportunity to ask questions and all were answered. The patient agreed with the plan and demonstrated an understanding of the instructions.   The patient was advised to call back or seek an in-person evaluation if the symptoms worsen or if the condition fails to improve as anticipated.     Eliezer Lofts, MD

## 2022-04-19 NOTE — Assessment & Plan Note (Signed)
COVID19  Infection < 5 days from onset of symptoms in  vaccinated overweight individual with history of  HTN and breast cancer.  No clear sign of bacterial infection at this time.   No SOB.  No red flags/need for ER visit or in-person exam at respiratory clinic at this time..    Pt higher risk for COVID complications given  HTN and breast cancer. GFR  71 but there are multiple medication contraindications. t opaxlovid  Start molnupiravir 5 day course. Reviewed course of medication and side effect profile with patient in detail.   Symptomatic care with mucinex, flonase, saline and cough suppressant at nigh as needed.t. If SOB begins symptoms worsening.. have low threshold for in-person exam, if severe shortness of breath ER visit recommended.  Can monitor Oxygen saturation at home with home monitor if able to obtain.  Go to ER if O2 sat < 90% on room air.   Reviewed home care and provided information. Recommended quarantine 5 days isolation recommended. Return to work day 6 and wear mask for 4 more days to complete 10 days. Provided info about prevention of spread of COVID 19.

## 2022-04-30 ENCOUNTER — Inpatient Hospital Stay: Payer: Medicare Other

## 2022-04-30 ENCOUNTER — Inpatient Hospital Stay: Payer: Medicare Other | Admitting: Hematology and Oncology

## 2022-05-06 ENCOUNTER — Other Ambulatory Visit: Payer: Self-pay | Admitting: Internal Medicine

## 2022-05-07 ENCOUNTER — Ambulatory Visit
Admission: RE | Admit: 2022-05-07 | Discharge: 2022-05-07 | Disposition: A | Discharge status: Home / Self Care | Payer: Medicare Other | Source: Ambulatory Visit | Attending: Surgery | Admitting: Surgery

## 2022-05-07 DIAGNOSIS — Z171 Estrogen receptor negative status [ER-]: Secondary | ICD-10-CM

## 2022-05-07 DIAGNOSIS — N6322 Unspecified lump in the left breast, upper inner quadrant: Secondary | ICD-10-CM | POA: Diagnosis not present

## 2022-05-07 MED ORDER — GADOPICLENOL 0.5 MMOL/ML IV SOLN
8.5000 mL | Freq: Once | INTRAVENOUS | Status: AC | PRN
  Administered 2022-05-07: 8.5 mL via INTRAVENOUS

## 2022-05-10 ENCOUNTER — Other Ambulatory Visit: Payer: Self-pay | Admitting: Pediatrics

## 2022-05-10 ENCOUNTER — Inpatient Hospital Stay: Payer: Medicare Other | Attending: Hematology and Oncology | Admitting: Hematology and Oncology

## 2022-05-10 ENCOUNTER — Inpatient Hospital Stay: Payer: Medicare Other

## 2022-05-10 ENCOUNTER — Other Ambulatory Visit: Payer: Self-pay | Admitting: Surgery

## 2022-05-10 ENCOUNTER — Other Ambulatory Visit: Payer: Self-pay

## 2022-05-10 VITALS — BP 152/78 | HR 96 | Temp 97.5°F | Resp 18 | Ht 62.0 in | Wt 177.0 lb

## 2022-05-10 DIAGNOSIS — I10 Essential (primary) hypertension: Secondary | ICD-10-CM

## 2022-05-10 DIAGNOSIS — Z87891 Personal history of nicotine dependence: Secondary | ICD-10-CM | POA: Diagnosis not present

## 2022-05-10 DIAGNOSIS — N6322 Unspecified lump in the left breast, upper inner quadrant: Secondary | ICD-10-CM

## 2022-05-10 DIAGNOSIS — Z170 Estrogen receptor positive status [ER+]: Secondary | ICD-10-CM | POA: Insufficient documentation

## 2022-05-10 DIAGNOSIS — C50412 Malignant neoplasm of upper-outer quadrant of left female breast: Secondary | ICD-10-CM | POA: Diagnosis not present

## 2022-05-10 DIAGNOSIS — Z780 Asymptomatic menopausal state: Secondary | ICD-10-CM

## 2022-05-10 NOTE — Assessment & Plan Note (Addendum)
04/11/2022: Remote history left lumpectomy, screening mammogram detected left breast distortion by ultrasound measured 1.7 cm 11:30 position and irregular hypoechoic mass 1.4 cm, axilla negative, biopsy revealed grade 2 invasive lobular cancer at the 11:30 position ER 60%, PR 0%, Ki-67 15%, HER2 2+ by IHC, FISH negative ratio 1.55.  Second mass 1:00: Benign  Pathology and radiology counseling:Discussed with the patient, the details of pathology including the type of breast cancer,the clinical staging, the significance of ER, PR and HER-2/neu receptors and the implications for treatment. After reviewing the pathology in detail, we proceeded to discuss the different treatment options between surgery, radiation, chemotherapy, antiestrogen therapies.  Recommendations: 1. Breast conserving surgery followed by 2. Oncotype DX testing to determine if chemotherapy would be of any benefit followed by 3. Adjuvant radiation therapy followed by 4. Adjuvant antiestrogen therapy  Oncotype counseling: I discussed Oncotype DX test. I explained to the patient that this is a 21 gene panel to evaluate patient tumors DNA to calculate recurrence score. This would help determine whether patient has high risk or low risk breast cancer. She understands that if her tumor was found to be high risk, she would benefit from systemic chemotherapy. If low risk, no need of chemotherapy.  Return to clinic after surgery to discuss final pathology report and then determine if Oncotype DX testing will need to be sent.

## 2022-05-11 NOTE — Progress Notes (Signed)
[facility] CONSULT NOTE  Patient Care Team: Crecencio Mc, MD as PCP - General (Internal Medicine)  CHIEF COMPLAINTS/PURPOSE OF CONSULTATION:  Newly diagnosed breast cancer  HISTORY OF PRESENTING ILLNESS:  Kara Harvey 81 y.o. female is here because of recent diagnosis of left breast cancer.  Patient had a benign left lumpectomy in 1980s.  This was a routine screening mammogram that detected a left breast distortion measuring 1.7 cm.  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  There was a second mass at 1 o'clock position which was benign.  But the breast MRI picked up a satellite lesion which is still being biopsy. She presented to was accompanied by her daughters and daughter-in-law to discuss her treatment plan.  Her daughter-in-law is a Orthoptist.  I reviewed her records extensively and collaborated the history with the patient.  SUMMARY OF ONCOLOGIC HISTORY: Oncology History  Malignant neoplasm of upper-outer quadrant of left breast in female, estrogen receptor positive ([place])  04/11/2022 Initial Diagnosis   Remote history left lumpectomy, screening mammogram detected left breast distortion by ultrasound measured 1.7 cm 11:30 position and irregular hypoechoic mass 1.4 cm, axilla negative, biopsy revealed grade 2 invasive lobular cancer at the 11:30 position ER 60%, PR 0%, Ki-67 15%, HER2 2+ by IHC, FISH negative ratio 1.55.  Second mass 1:00: Benign      MEDICAL HISTORY:  Past Medical History:  Diagnosis Date   Closed malleolar fracture, unspecified laterality, with routine healing, subsequent encounter 03/04/2014   occured in  July 2013   Fracture of finger, left, closed 04/28/2017   Humerus fracture 07/13/2017   Hyperlipidemia    Hypertension     SURGICAL HISTORY: Past Surgical History:  Procedure Laterality Date   BREAST EXCISIONAL BIOPSY Left 1988   benign   BREAST  SURGERY Left 1986   for false positive/biopsy   EYE SURGERY Left 6wks ago   cataract   FRACTURE SURGERY Left 01/14/2011   ankle   TONSILECTOMY, ADENOIDECTOMY, BILATERAL MYRINGOTOMY AND TUBES Bilateral 1957   does not believe adenoids were removed   TUBAL LIGATION  y-37    SOCIAL HISTORY: Social History   Socioeconomic History   Marital status: Married    Spouse name: Not on file   Number of children: Not on file   Years of education: Not on file   Highest education level: Not on file  Occupational History   Not on file  Tobacco Use   Smoking status: Former    Packs/day: 2.00    Types: Cigarettes    Quit date: 03/28/1979    Years since quitting: 43.1   Smokeless tobacco: Never  Vaping Use   Vaping Use: Never used  Substance and Sexual Activity   Alcohol use: Yes    Alcohol/week: 5.0 standard drinks of alcohol    Types: 5 Glasses of wine per week   Drug use: No   Sexual activity: Yes  Other Topics Concern   Not on file  Social History Narrative   Not on file   Social Determinants of Health   Financial Resource Strain: Low Risk  (10/23/2021)   Overall Financial Resource Strain (CARDIA)    Difficulty of Paying Living Expenses: Not hard at all  Food Insecurity: No Food Insecurity (10/23/2021)   Hunger Vital Sign    Worried About Running Out of Food in the Last Year: Never true    Ran  Out of Food in the Last Year: Never true  Transportation Needs: No Transportation Needs (10/23/2021)   PRAPARE - Hydrologist (Medical): No    Lack of Transportation (Non-Medical): No  Physical Activity: Not on file  Stress: No Stress Concern Present (10/23/2021)   [place]    Feeling of Stress : Not at all  Social Connections: Unknown (10/23/2021)   Social Connection and Isolation Panel [NHANES]    Frequency of Communication with Friends and Family: More than three times a week    Frequency of  Social Gatherings with Friends and Family: Once a week    Attends Religious Services: Not on Advertising copywriter or Organizations: Not on file    Attends Archivist Meetings: Not on file    Marital Status: Married  Intimate Partner Violence: Not At Risk (10/23/2021)   Humiliation, Afraid, Rape, and Kick questionnaire    Fear of Current or Ex-Partner: No    Emotionally Abused: No    Physically Abused: No    Sexually Abused: No    FAMILY HISTORY: Family History  Problem Relation Age of Onset   Cancer Mother        lung Cancer   Hyperlipidemia Mother    Arthritis Father    Alzheimer's disease Father    Colon cancer Paternal Aunt     ALLERGIES:  is allergic to sulfa antibiotics.  MEDICATIONS:  Current Outpatient Medications  Medication Sig Dispense Refill   ALPRAZolam (XANAX) 0.5 MG tablet TAKE 1 TABLET(0.25 MG) BY MOUTH DAILY AS NEEDED FOR ANXIETY 60 tablet 1   amLODipine (NORVASC) 5 MG tablet Take 1 tablet (5 mg total) by mouth daily. 90 tablet 1   atorvastatin (LIPITOR) 20 MG tablet TAKE 1 TABLET(20 MG) BY MOUTH DAILY 90 tablet 3   buPROPion ER (WELLBUTRIN SR) 100 MG 12 hr tablet TAKE 1 TABLET(100 MG) BY MOUTH TWICE DAILY 60 tablet 5   calcium carbonate (TUMS - DOSED IN MG ELEMENTAL CALCIUM) 500 MG chewable tablet Chew 1 tablet (200 mg of elemental calcium total) by mouth 3 (three) times daily as needed for indigestion or heartburn. 30 tablet 0   Cholecalciferol (VITAMIN D3) 1000 UNITS CAPS Take 1,000 Units by mouth daily.     famotidine (PEPCID) 20 MG tablet Take 20 mg by mouth 2 (two) times daily.     losartan (COZAAR) 100 MG tablet TAKE 1 TABLET(100 MG) BY MOUTH DAILY 90 tablet 1   ondansetron (ZOFRAN) 4 MG tablet Take 1 tablet (4 mg total) by mouth every 8 (eight) hours as needed for nausea or vomiting. 30 tablet 0   valACYclovir (VALTREX) 1000 MG tablet TAKE 2 TABLETS(2000 MG) BY MOUTH TWICE DAILY 20 tablet 0   Current Facility-Administered Medications   Medication Dose Route Frequency Provider Last Rate Last Admin   cyanocobalamin ((VITAMIN B-12)) injection 1,000 mcg  1,000 mcg Intramuscular Once Crecencio Mc, MD        REVIEW OF SYSTEMS:   Constitutional: Denies fevers, chills or abnormal night sweats Breast:  Denies any palpable lumps or discharge All other systems were reviewed with the patient and are negative.  PHYSICAL EXAMINATION: ECOG PERFORMANCE STATUS: 1 - Symptomatic but completely ambulatory  Vitals:   05/10/22 1551  BP: (!) 152/78  Pulse: 96  Resp: 18  Temp: (!) 97.5 F (36.4 C)  SpO2: 98%   Filed Weights   05/10/22 1551  Weight: 177 lb (80.3 kg)    GENERAL:alert, no distress and comfortable    LABORATORY DATA:  I have reviewed the data as listed Lab Results  Component Value Date   WBC 8.7 10/13/2019   HGB 14.1 10/13/2019   HCT 43.0 10/13/2019   MCV 88.1 10/13/2019   PLT 278 10/13/2019   Lab Results  Component Value Date   NA 137 11/22/2021   K 4.0 11/22/2021   CL 102 11/22/2021   CO2 25 11/22/2021    RADIOGRAPHIC STUDIES: I have personally reviewed the radiological reports and agreed with the findings in the report.  ASSESSMENT AND PLAN:  Malignant neoplasm of upper-outer quadrant of left breast in female, estrogen receptor positive ([place]) 04/11/2022: Remote history left lumpectomy, screening mammogram detected left breast distortion by ultrasound measured 1.7 cm 11:30 position and irregular hypoechoic mass 1.4 cm, axilla negative, biopsy revealed grade 2 invasive lobular cancer at the 11:30 position ER 60%, PR 0%, Ki-67 15%, HER2 2+ by IHC, FISH negative ratio 1.55.  Second mass 1:00: Benign  Pathology and radiology counseling:Discussed with the patient, the details of pathology including the type of breast cancer,the clinical staging, the significance of ER, PR and HER-2/neu receptors and the implications for treatment. After reviewing the pathology in detail, we proceeded to discuss the  different treatment options between surgery, radiation, chemotherapy, antiestrogen therapies.  Recommendations: 1. Breast conserving surgery followed by 2. did not recommend Oncotype testing because of her age. 3. +/- Adjuvant radiation therapy followed by 4. Adjuvant antiestrogen therapy  Return to clinic after surgery to discuss final pathology report   All questions were answered. The patient knows to call the clinic with any problems, questions or concerns.    Harriette Ohara, MD 05/11/22

## 2022-05-15 ENCOUNTER — Encounter: Payer: Self-pay | Admitting: *Deleted

## 2022-05-15 ENCOUNTER — Telehealth: Payer: Self-pay | Admitting: *Deleted

## 2022-05-15 NOTE — Telephone Encounter (Signed)
Left vm with navigation resources and contact information. Request return call with questions or needs.

## 2022-05-16 ENCOUNTER — Other Ambulatory Visit: Payer: Self-pay | Admitting: Internal Medicine

## 2022-05-16 ENCOUNTER — Inpatient Hospital Stay: Payer: Medicare Other | Attending: Hematology and Oncology | Admitting: Licensed Clinical Social Worker

## 2022-05-16 NOTE — Progress Notes (Signed)
[place] Work  Clinical Social Work was referred by new patient protocol for assessment of psychosocial needs.  Clinical Social Worker contacted patient by phone  to offer support and assess for needs.    Patient reports no needs currently. She has support from daughters and daughter in law as well as friends and neighbors. She is unsure if she will have any needs but agreed to contact CSW if any should arise.     [place], [place] Worker [employer]

## 2022-05-23 ENCOUNTER — Ambulatory Visit
Admission: RE | Admit: 2022-05-23 | Discharge: 2022-05-23 | Disposition: A | Discharge status: Home / Self Care | Payer: Medicare Other | Source: Ambulatory Visit | Attending: Surgery | Admitting: Surgery

## 2022-05-23 DIAGNOSIS — C50212 Malignant neoplasm of upper-inner quadrant of left female breast: Secondary | ICD-10-CM | POA: Diagnosis not present

## 2022-05-23 DIAGNOSIS — N6012 Diffuse cystic mastopathy of left breast: Secondary | ICD-10-CM | POA: Diagnosis not present

## 2022-05-23 DIAGNOSIS — N6322 Unspecified lump in the left breast, upper inner quadrant: Secondary | ICD-10-CM

## 2022-05-23 MED ORDER — GADOPICLENOL 0.5 MMOL/ML IV SOLN: 8.0000 mL | Freq: Once | INTRAVENOUS | Status: DC | PRN

## 2022-05-25 ENCOUNTER — Encounter: Payer: Self-pay | Admitting: *Deleted

## 2022-05-29 ENCOUNTER — Ambulatory Visit: Payer: Self-pay | Admitting: Surgery

## 2022-05-29 ENCOUNTER — Ambulatory Visit (INDEPENDENT_AMBULATORY_CARE_PROVIDER_SITE_OTHER): Payer: Medicare Other | Admitting: Internal Medicine

## 2022-05-29 ENCOUNTER — Encounter: Payer: Self-pay | Admitting: Internal Medicine

## 2022-05-29 VITALS — BP 128/72 | HR 84 | Temp 97.4°F | Ht 62.0 in | Wt 177.2 lb

## 2022-05-29 DIAGNOSIS — I10 Essential (primary) hypertension: Secondary | ICD-10-CM

## 2022-05-29 DIAGNOSIS — Z1211 Encounter for screening for malignant neoplasm of colon: Secondary | ICD-10-CM

## 2022-05-29 DIAGNOSIS — C50912 Malignant neoplasm of unspecified site of left female breast: Secondary | ICD-10-CM

## 2022-05-29 DIAGNOSIS — Z170 Estrogen receptor positive status [ER+]: Secondary | ICD-10-CM

## 2022-05-29 DIAGNOSIS — Z86010 Personal history of colonic polyps: Secondary | ICD-10-CM | POA: Diagnosis not present

## 2022-05-29 DIAGNOSIS — C50412 Malignant neoplasm of upper-outer quadrant of left female breast: Secondary | ICD-10-CM

## 2022-05-29 DIAGNOSIS — F430 Acute stress reaction: Secondary | ICD-10-CM | POA: Diagnosis not present

## 2022-05-29 DIAGNOSIS — F411 Generalized anxiety disorder: Secondary | ICD-10-CM | POA: Diagnosis not present

## 2022-05-29 MED ORDER — ALPRAZOLAM 0.5 MG PO TABS: 0.2500 mg | ORAL_TABLET | Freq: Two times a day (BID) | ORAL | 5 refills | Status: DC | PRN

## 2022-05-29 MED ORDER — AMLODIPINE BESYLATE 5 MG PO TABS: 7.5000 mg | ORAL_TABLET | Freq: Every day | ORAL | 0 refills | Status: DC

## 2022-05-29 NOTE — Assessment & Plan Note (Signed)
Readings at home have been somewhat elevated .Marland Kitchen Advised to increase dose of amlodipine to 7. '5mg'$  daily and continue 100 mg losartan daily.

## 2022-05-29 NOTE — Patient Instructions (Addendum)
Add Kara Harvey to your list of transportation people !  [phone]    If you ever need a pork shoulder barbecued,  just ask him    Increase amlodipine to 1.5 tablets  daily,  In once week start checking daily  BP at home and let me know if readings are over 130/80

## 2022-05-29 NOTE — Assessment & Plan Note (Addendum)
2014.  Incomplete converted to virtual by [employer].  She has decided to continue to defer  in light of her age and  current diagnosis of breast cancer

## 2022-05-29 NOTE — Progress Notes (Signed)
Subjective:  Patient ID: Kara Harvey, female    DOB: Mar 16, 1941  Age: 81 y.o. MRN: [id number]  CC: The primary encounter diagnosis was Screening for colon cancer. Diagnoses of Personal history of colonic polyps, Essential hypertension, benign, Anxiety as acute reaction to gross stress, and Malignant neoplasm of upper-outer quadrant of left breast in female, estrogen receptor positive ([place]) were also pertinent to this visit.   HPI Kara Harvey presents for  Chief Complaint  Patient presents with   Follow-up   1) BRCA:  diagnosed with  left breast CA by biopsy by Dr. Brantley Stage in September:   invasive lobular cancer , ER positive PR negative HER2 negative with a Ki-67 of 15%.  There was a second mass at 1 o'clock position which was benign.  a third mass the breast MRI picked up a satellite lesion was biopsied and is negative  (see below) . She met with her oncologist Dr Lindi Adie on oct 27 who recommended  Breast conserving surgery  with or without adjuvant radiation therapy followed by  Adjuvant antiestrogen therapy..   the satellite lesion was biopsied on Nov 8: and was benign:   Pathology revealed [place].- NEGATIVE FOR MALIGNANCY of the LEFT breast, upper inner quadrant (barbell clip). This was found to be concordant by Dr. Claudie Revering.   Pathology results were discussed with the patient by telephone.   She is scheduled for a DEXA tomorrow.  She has been advised by [place] to have her XRT at [facility]. Transportation by neighbors.   She has not told her husband of the diagnosis      Outpatient Medications Prior to Visit  Medication Sig Dispense Refill   atorvastatin (LIPITOR) 20 MG tablet TAKE 1 TABLET(20 MG) BY MOUTH DAILY 90 tablet 3   buPROPion ER (WELLBUTRIN SR) 100 MG 12 hr tablet TAKE 1 TABLET(100 MG) BY MOUTH TWICE DAILY 60 tablet 5   calcium carbonate (TUMS - DOSED IN MG ELEMENTAL CALCIUM) 500 MG chewable tablet Chew 1 tablet (200 mg of  elemental calcium total) by mouth 3 (three) times daily as needed for indigestion or heartburn. 30 tablet 0   Cholecalciferol (VITAMIN D3) 1000 UNITS CAPS Take 1,000 Units by mouth daily.     famotidine (PEPCID) 20 MG tablet Take 20 mg by mouth 2 (two) times daily.     losartan (COZAAR) 100 MG tablet TAKE 1 TABLET(100 MG) BY MOUTH DAILY 90 tablet 1   ondansetron (ZOFRAN) 4 MG tablet Take 1 tablet (4 mg total) by mouth every 8 (eight) hours as needed for nausea or vomiting. 30 tablet 0   valACYclovir (VALTREX) 1000 MG tablet TAKE 2 TABLETS(2000 MG) BY MOUTH TWICE DAILY 20 tablet 0   ALPRAZolam (XANAX) 0.5 MG tablet TAKE 1 TABLET(0.25 MG) BY MOUTH DAILY AS NEEDED FOR ANXIETY 60 tablet 1   amLODipine (NORVASC) 5 MG tablet Take 1 tablet (5 mg total) by mouth daily. 90 tablet 1   Facility-Administered Medications Prior to Visit  Medication Dose Route Frequency Provider Last Rate Last Admin   cyanocobalamin ((VITAMIN B-12)) injection 1,000 mcg  1,000 mcg Intramuscular Once Crecencio Mc, MD        Review of Systems;  Patient denies headache, fevers, malaise, unintentional weight loss, skin rash, eye pain, sinus congestion and sinus pain, sore throat, dysphagia,  hemoptysis , cough, dyspnea, wheezing, chest pain, palpitations, orthopnea, edema, abdominal pain, nausea, melena, diarrhea, constipation, flank pain, dysuria, hematuria, urinary  Frequency, nocturia, numbness, tingling,  seizures,  Focal weakness, Loss of consciousness,  Tremor, insomnia, depression, anxiety, and suicidal ideation.      Objective:  BP 128/72   Pulse 84   Temp (!) 97.4 F (36.3 C) (Oral)   Ht _0  (1.575 m)   Wt 177 lb 3.2 oz (80.4 kg)   SpO2 97%   BMI 32.41 kg/m   BP Readings from Last 3 Encounters:  05/29/22 128/72  05/10/22 (!) 152/78  04/13/22 (!) 152/84    Wt Readings from Last 3 Encounters:  05/29/22 177 lb 3.2 oz (80.4 kg)  05/10/22 177 lb (80.3 kg)  04/19/22 180 lb (81.6 kg)    General  appearance: alert, cooperative and appears stated age Ears: normal TM's and external ear canals both ears Throat: lips, mucosa, and tongue normal; teeth and gums normal Neck: no adenopathy, no carotid bruit, supple, symmetrical, trachea midline and thyroid not enlarged, symmetric, no tenderness/mass/nodules Back: symmetric, no curvature. ROM normal. No CVA tenderness. Lungs: clear to auscultation bilaterally Heart: regular rate and rhythm, S1, S2 normal, no murmur, click, rub or gallop Abdomen: soft, non-tender; bowel sounds normal; no masses,  no organomegaly Pulses: 2+ and symmetric Skin: Skin color, texture, turgor normal. No rashes or lesions Lymph nodes: Cervical, supraclavicular, and axillary nodes normal. Neuro:  awake and interactive with normal mood and affect. Higher cortical functions are normal. Speech is clear without word-finding difficulty or dysarthria. Extraocular movements are intact. Visual fields of both eyes are grossly intact. Sensation to light touch is grossly intact bilaterally of upper and lower extremities. Motor examination shows 4+/5 symmetric hand grip and upper extremity and 5/5 lower extremity strength. There is no pronation or drift. Gait is non-ataxic   No results found for: "HGBA1C"  Lab Results  Component Value Date   CREATININE 0.77 11/22/2021   CREATININE 0.81 06/24/2020   CREATININE 0.74 11/17/2019    Lab Results  Component Value Date   WBC 8.7 10/13/2019   HGB 14.1 10/13/2019   HCT 43.0 10/13/2019   PLT 278 10/13/2019   GLUCOSE 91 11/22/2021   CHOL 162 11/22/2021   TRIG 66.0 11/22/2021   HDL 79.70 11/22/2021   LDLDIRECT 135.1 09/03/2013   LDLCALC 69 11/22/2021   ALT 13 11/22/2021   AST 13 11/22/2021   NA 137 11/22/2021   K 4.0 11/22/2021   CL 102 11/22/2021   CREATININE 0.77 11/22/2021   BUN 17 11/22/2021   CO2 25 11/22/2021   TSH 2.29 06/24/2020   MICROALBUR 0.1 09/03/2013    MR LT BREAST BX W LOC DEV 1ST LESION IMAGE BX SPEC MR  GUIDE  Addendum Date: 05/25/2022   ADDENDUM REPORT: 05/25/2022 14:49 ADDENDUM: Pathology revealed FIBROCYSTIC CHANGES WITH FOCAL APOCRINE METAPLASIA.- NEGATIVE FOR MALIGNANCY of the LEFT breast, upper inner quadrant (barbell clip). This was found to be concordant by Dr. Claudie Revering. Pathology results were discussed with the patient by telephone. The patient reported doing well after the biopsy with tenderness at the site. Post biopsy instructions and care were reviewed and questions were answered. The patient was encouraged to call [facility] for any additional concerns. The patient has a recent diagnosis of left breast cancer and should follow her outlined treatment plan. Pathology results reported by Stacie Acres RN on 05/25/2022. Electronically Signed   By: Claudie Revering M.D.   On: 05/25/2022 14:49   Result Date: 05/25/2022 CLINICAL DATA:  6 mm dominant satellite nodule in the upper inner quadrant of the left breast on  a recent staging MRI for recently diagnosed left breast invasive lobular carcinoma. EXAM: MRI GUIDED CORE NEEDLE BIOPSY OF THE LEFT BREAST TECHNIQUE: Multiplanar, multisequence MR imaging of the left breast was performed both before and after administration of intravenous contrast. CONTRAST:  8 cc VUEWAY COMPARISON:  Previous exam(s). FINDINGS: I met with the patient, and we discussed the procedure of MRI guided biopsy, including risks, benefits, and alternatives. Specifically, we discussed the risks of infection, bleeding, tissue injury, clip migration, and inadequate sampling. Informed, written consent was given. The usual time out protocol was performed immediately prior to the procedure. Using sterile technique, 1% Lidocaine, MRI guidance, and a 9 gauge vacuum assisted device, biopsy was performed of the recently demonstrated 6 mm dominant satellite nodule in the upper inner quadrant of the left breast using a lateral approach. At the conclusion of the  procedure, a barbell shaped tissue marker clip was deployed into the biopsy cavity. Follow-up 2-view mammogram was performed and dictated separately. IMPRESSION: MRI guided biopsy of a 6 mm dominant satellite nodule in the upper inner quadrant of the left breast. No apparent complications. Electronically Signed: By: Claudie Revering M.D. On: 05/23/2022 10:16  MM CLIP PLACEMENT LEFT  Result Date: 05/23/2022 CLINICAL DATA:  Status post MR guided core needle biopsy of a 6 mm dominant satellite nodule in the upper inner quadrant of the left breast seen on a recent staging MRI for recently diagnosed left breast invasive lobular carcinoma. EXAM: 3D DIAGNOSTIC LEFT MAMMOGRAM POST MRI BIOPSY COMPARISON:  Previous exam(s). FINDINGS: 3D Mammographic images were obtained following MR guided biopsy of the recently demonstrated 6 mm dominant satellite nodule in the upper inner quadrant of the left breast. The biopsy marking clip is in expected position at the site of biopsy. IMPRESSION: Appropriate positioning of the barbell shaped biopsy marking clip at the site of biopsy in the upper inner quadrant of the left breast. This is located 4.8 cm from the ribbon shaped clip at the location of the recently biopsied invasive lobular carcinoma. Final Assessment: Post Procedure Mammograms for Marker Placement Electronically Signed   By: Claudie Revering M.D.   On: 05/23/2022 10:36   Assessment & Plan:   Problem List Items Addressed This Visit     Anxiety as acute reaction to gross stress     She has frequent insomnia managed with  prn alprazolam aggravated by her recent diagnosis of breast cancer and her husband's history of disabling stroke. . The risks and benefits of benzodiazepine use were reviewed with patient today including excessive sedation leading to respiratory depression,  impaired thinking/driving, and addiction.  Patient was advised to avoid concurrent use with alcohol, to use medication only as needed and not to share  with others  .       Relevant Medications   ALPRAZolam (XANAX) 0.5 MG tablet   Essential hypertension, benign    Readings at home have been somewhat elevated .Marland Kitchen Advised to increase dose of amlodipine to 7. 28m daily and continue 100 mg losartan daily.        Relevant Medications   amLODipine (NORVASC) 5 MG tablet   Malignant ne[place] of upper[place]outer quadrant of left [place] breast [place]    Invasive lobular CA of left breast.  Awaiting appt for [facility], referral to [facility] for XRT and adjuvant therapy .  She has declined to share information with husband due to concern for his state of mind.       Relevant Medications   ALPRAZolam (XANAX) 0.5 MG  tablet   Personal history of colonic polyps    2014.  Incomplete converted to virtual by [employer].  She has decided to continue to defer  in light of her age and  current diagnosis of breast cancer       Other Visit Diagnoses     Screening for colon cancer    -  Primary       I spent a total of 84mnutes with this patient in a face to face visit on the date of this encounter reviewing the last office visit with me in   September,  most recent visit with cDr Gudenea her oncologist.  home blood pressure readings,prior colonoscopy , recent biopsies ,   and post visit ordering of testing and therapeutics.    Follow-up: No follow-ups on file.   TCrecencio Mc MD

## 2022-05-29 NOTE — Assessment & Plan Note (Signed)
She has frequent insomnia managed with  prn alprazolam aggravated by her recent diagnosis of breast cancer and her husband's history of disabling stroke. . The risks and benefits of benzodiazepine use were reviewed with patient today including excessive sedation leading to respiratory depression,  impaired thinking/driving, and addiction.  Patient was advised to avoid concurrent use with alcohol, to use medication only as needed and not to share with others  .

## 2022-05-29 NOTE — Assessment & Plan Note (Signed)
Invasive lobular CA of left breast.  Awaiting appt for BCS, referral to [facility] for XRT and adjuvant therapy .  She has declined to share information with husband due to concern for his state of mind.

## 2022-05-30 ENCOUNTER — Ambulatory Visit (HOSPITAL_BASED_OUTPATIENT_CLINIC_OR_DEPARTMENT_OTHER)
Admission: RE | Admit: 2022-05-30 | Discharge: 2022-05-30 | Disposition: A | Discharge status: Home / Self Care | Payer: Medicare Other | Source: Ambulatory Visit | Attending: Hematology and Oncology | Admitting: Hematology and Oncology

## 2022-05-30 ENCOUNTER — Other Ambulatory Visit: Payer: Self-pay | Admitting: Surgery

## 2022-05-30 ENCOUNTER — Encounter: Payer: Self-pay | Admitting: *Deleted

## 2022-05-30 DIAGNOSIS — Z780 Asymptomatic menopausal state: Secondary | ICD-10-CM | POA: Insufficient documentation

## 2022-05-30 DIAGNOSIS — C50912 Malignant neoplasm of unspecified site of left female breast: Secondary | ICD-10-CM

## 2022-05-30 DIAGNOSIS — Z170 Estrogen receptor positive status [ER+]: Secondary | ICD-10-CM

## 2022-05-30 DIAGNOSIS — M8589 Other specified disorders of bone density and structure, multiple sites: Secondary | ICD-10-CM | POA: Diagnosis not present

## 2022-06-03 NOTE — Therapy (Signed)
OUTPATIENT PHYSICAL THERAPY BREAST CANCER BASELINE EVALUATION   Patient Name: Kara Harvey MRN: [id number] DOB:05-06-1941, 81 y.o., female Today's Date: 06/04/2022  END OF SESSION:  PT End of Session - 06/04/22 1106     Visit Number 1    Number of Visits 2    Date for PT Re-Evaluation 07/16/22    PT Start Time 1108    PT Stop Time 1201    PT Time Calculation (min) 53 min    Activity Tolerance Patient tolerated treatment well;Treatment limited secondary to medical complications (Comment)             Past Medical History:  Diagnosis Date   Closed malleolar fracture, unspecified laterality, with routine healing, subsequent encounter 03/04/2014   occured in  July 2013   Fracture of finger, left, closed 04/28/2017   Humerus fracture 07/13/2017   Hyperlipidemia    Hypertension    Past Surgical History:  Procedure Laterality Date   BREAST BIOPSY Left 04/11/2022   x'2   BREAST EXCISIONAL BIOPSY Left 1988   benign   BREAST SURGERY Left 1986   for false positive/biopsy   EYE SURGERY Left 6wks ago   cataract   FRACTURE SURGERY Left 01/14/2011   ankle   TONSILECTOMY, ADENOIDECTOMY, BILATERAL MYRINGOTOMY AND TUBES Bilateral 1957   does not believe adenoids were removed   [facility]   Patient Active Problem List   Diagnosis Date Noted   COVID-19 04/19/2022   Malignant neoplasm of upper-outer quadrant of left breast in female, estrogen receptor positive ([facility]) 04/16/2022   Malignant neoplasm of upper-outer quadrant of left female breast ([place]) 04/13/2022   Encounter for preventive health examination 09/01/2020   Personal history of colonic polyps 06/24/2020   Anxiety as acute reaction to gross stress 12/20/2018   Vitamin D deficiency 04/29/2016   Osteopenia 04/26/2016   Long-term use of high-risk medication 01/25/2016   Depressive disorder 01/25/2016   Cataract extraction status of left eye 03/04/2014   Overweight (BMI 25.0-29.9) 09/05/2013   Urge  incontinence 03/27/2013   Essential hypertension, benign 03/27/2013   Hyperlipidemia LDL goal <130 03/27/2013    PCP: Deborra Medina ,MD  REFERRING PROVIDER: Nicholas Lose, MD  REFERRING DIAG: Left Breast Cancer  THERAPY DIAG:  Malignant neoplasm of upper-outer quadrant of left breast in female, estrogen receptor positive ([place])  Abnormal posture  Rationale for Evaluation and Treatment: Rehabilitation  ONSET DATE: 04/11/2022  SUBJECTIVE:  SUBJECTIVE STATEMENT: Patient reports she is here today to be seen by her medical team for her newly diagnosed left breast cancer.   PERTINENT HISTORY:  Patient was diagnosed on 04/11/2022 with left grade 2 Invasive Lobular Carcinoma. It measures .85 cm and is located in the left upper outer quadrant. It is ER +, PR negative, and Her 2 negative with a Ki67 of 15%. She is scheduled for left lumpectomy with SLNB on 06/13/2022  PATIENT GOALS:   reduce lymphedema risk and learn post op HEP.   PAIN:  Are you having pain? No  PRECAUTIONS: Active CA Other: hypertension, anxiety , prior right proximal humerus fx  HAND DOMINANCE: right  WEIGHT BEARING RESTRICTIONS: No  FALLS:  Has patient fallen in last 6 months? No  LIVING ENVIRONMENT: Patient lives with: husband, she is his primary caregiver since he had a CVA, son with schizo affective disorder lives with them. 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,   [place] FUNCTION: Independent   OBJECTIVE:  COGNITION: Overall cognitive status: Within functional limits for tasks assessed    POSTURE:  Forward head and  rounded shoulders posture  UPPER EXTREMITY AROM/PROM:  A/PROM RIGHT   eval   Shoulder extension 58  Shoulder flexion 138  Shoulder abduction 135  Shoulder internal rotation   Shoulder external rotation     (Blank rows = not tested)  A/PROM LEFT   eval  Shoulder extension 61  Shoulder flexion 155  Shoulder abduction 173  Shoulder internal rotation 73  Shoulder external rotation 90    (Blank rows = not tested)  CERVICAL AROM: All within functional limits:   UPPER EXTREMITY STRENGTH: WFL  LYMPHEDEMA ASSESSMENTS:   LANDMARK RIGHT   eval  10 cm proximal to olecranon process 29.4  Olecranon process 25.4  10 cm proximal to ulnar styloid process 21.7  Just proximal to ulnar styloid process 15.9  Across hand at thumb web space 19.3  At base of 2nd digit 6.6  (Blank rows = not tested)  LANDMARK LEFT   eval  10 cm proximal to olecranon process 27.7  Olecranon process 24.3  10 cm proximal to ulnar styloid process 20.4  Just proximal to ulnar styloid process 15.8  Across hand at thumb web space 18.9  At base of 2nd digit 6.15  (Blank rows = not tested)  L-DEX LYMPHEDEMA SCREENING:  The patient was assessed using the L-Dex machine today to produce a lymphedema index baseline score. The patient will be reassessed on a regular basis (typically every 3 months) to obtain new L-Dex scores. If the score is > 6.5 points away from his/her baseline score indicating onset of subclinical lymphedema, it will be recommended to wear a compression garment for 4 weeks, 12 hours per day and then be reassessed. If the score continues to be > 6.5 points from baseline at reassessment, we will initiate lymphedema treatment. Assessing in this manner has a 95% rate of preventing clinically significant lymphedema.   L-DEX FLOWSHEETS - 06/04/22 1100       L-DEX LYMPHEDEMA SCREENING   Measurement Type Unilateral    L-DEX MEASUREMENT EXTREMITY Upper Extremity    POSITION  Standing    DOMINANT  SIDE Right    At Risk Side Left    BASELINE SCORE (UNILATERAL) -0.4  QUICK DASH SURVEY: 9%  PATIENT EDUCATION:  Education details: Lymphedema risk reduction and post op shoulder/posture HEP, ABC class, SOZO screens, compression bra Person educated: Patient Education method: Explanation, Demonstration, Handout Education comprehension: Patient verbalized understanding and returned demonstration  HOME EXERCISE PROGRAM: Patient was instructed today in a home exercise program today for post op shoulder range of motion. These included active assist shoulder flexion in sitting,supine, scapular retraction, wall walking with shoulder abduction, and hands behind head external rotation sitting or supine.  She was encouraged to do these twice a day, holding 3 seconds and repeating 5 times when permitted by her physician.   ASSESSMENT:  CLINICAL IMPRESSION: Her multidisciplinary medical team met prior to her assessments to determine a recommended treatment plan. She is planning to have a left lumpectomy with SLNB on 06/13/2022. She will benefit from a post op PT reassessment to determine needs and from L-Dex screens every 3 months for 2 years to detect subclinical lymphedema.  Pt will benefit from skilled therapeutic intervention to improve on the following deficits: Decreased knowledge of precautions, impaired UE functional use, pain, decreased ROM, postural dysfunction.   PT treatment/interventions: ADL/self-care home management, pt/family education, therapeutic exercise  REHAB POTENTIAL: Excellent  CLINICAL DECISION MAKING: Stable/uncomplicated  EVALUATION COMPLEXITY: Low   GOALS: Goals reviewed with patient? YES  LONG TERM GOALS: (STG=LTG)    Name Target Date Goal status  1 Pt will be able to verbalize understanding of pertinent lymphedema risk reduction practices relevant to her dx specifically related to skin care.  Baseline:  No knowledge 06/04/2022 Achieved at eval   2 Pt will be able to return demo and/or verbalize understanding of the post op HEP related to regaining shoulder ROM. Baseline:  No knowledge 06/04/2022 Achieved at eval  3 Pt will be able to verbalize understanding of the importance of attending the post op After Breast CA Class for further lymphedema risk reduction education and therapeutic exercise.  Baseline:  No knowledge 06/04/2022 Achieved at eval  4 Pt will demo she has regained full shoulder ROM and function post operatively compared to baselines.  Baseline: See objective measurements taken today. 07/16/2022 INITIAL    PLAN:  PT FREQUENCY/DURATION: EVAL and 1 follow up appointment.   PLAN FOR NEXT SESSION: will reassess 3-4 weeks post op to determine needs.   Patient will follow up at outpatient cancer rehab 3-4 weeks following surgery.  If the patient requires physical therapy at that time, a specific plan will be dictated and sent to the referring physician for approval. The patient was educated today on appropriate basic range of motion exercises to begin post operatively and the importance of attending the After Breast Cancer class following surgery.  Patient was educated today on lymphedema risk reduction practices as it pertains to recommendations that will benefit the patient immediately following surgery.  She verbalized good understanding.    Physical Therapy Information for After Breast Cancer Surgery/Treatment:  Lymphedema is a swelling condition that you may be at risk for in your arm if you have lymph nodes removed from the armpit area.  After a sentinel node biopsy, the risk is approximately 5-9% and is higher after an axillary node dissection.  There is treatment available for this condition and it is not life-threatening.  Contact your physician or physical therapist with concerns. You may begin the 4 shoulder/posture exercises (see additional sheet) when permitted by your physician (typically a week after surgery).  If  you have drains, you may need to wait until  those are removed before beginning range of motion exercises.  A general recommendation is to not lift your arms above shoulder height until drains are removed.  These exercises should be done to your tolerance and gently.  This is not a "no pain/no gain" type of recovery so listen to your body and stretch into the range of motion that you can tolerate, stopping if you have pain.  If you are having immediate reconstruction, ask your plastic surgeon about doing exercises as he or she may want you to wait. We encourage you to attend the free one time ABC (After Breast Cancer) class offered by [facility].  You will learn information related to lymphedema risk, prevention and treatment and additional exercises to regain mobility following surgery.  You can call [phone] for more information.  This is offered the 1st and 3rd Monday of each month.  You only attend the class one time. While undergoing any medical procedure or treatment, try to avoid blood pressure being taken or needle sticks from occurring on the arm on the side of cancer.   This recommendation begins after surgery and continues for the rest of your life.  This may help reduce your risk of getting lymphedema (swelling in your arm). An excellent resource for those seeking information on lymphedema is the National Lymphedema Network's web site. It can be accessed at [place].org If you notice swelling in your hand, arm or breast at any time following surgery (even if it is many years from now), please contact your doctor or physical therapist to discuss this.  Lymphedema can be treated at any time but it is easier for you if it is treated early on.  If you feel like your shoulder motion is not returning to normal in a reasonable amount of time, please contact your surgeon or physical therapist.  [facility] [phone]. [address], [address], [place] [address]  ABC CLASS After Breast Cancer Class  After Breast Cancer Class is a specially designed exercise class to assist you in a safe recover after having breast cancer surgery.  In this class you will learn how to get back to full function whether your drains were just removed or if you had surgery a month ago.  This one-time class is held the 1st and 3rd Monday of every month from 11:00 a.m. until 12:00 noon virtually.  This class is FREE and space is limited. For more information or to register for the next available class, call [phone].  Class Goals  Understand specific stretches to improve the flexibility of you chest and shoulder. Learn ways to safely strengthen your upper body and improve your posture. Understand the warning signs of infection and why you may be at risk for an arm infection. Learn about Lymphedema and prevention.  ** You do not attend this class until after surgery.  Drains must be removed to participate  Patient was instructed today in a home exercise program today for post op shoulder range of motion. These included active assist shoulder flexion in sitting, scapular retraction, wall walking with shoulder abduction, and hands behind head external rotation.  She was encouraged to do these twice a day, holding 3 seconds and repeating 5 times when permitted by her physician.    Claris Pong, PT 06/04/2022, 12:02 PM

## 2022-06-04 ENCOUNTER — Ambulatory Visit: Payer: Medicare Other | Attending: Hematology and Oncology

## 2022-06-04 ENCOUNTER — Other Ambulatory Visit: Payer: Self-pay

## 2022-06-04 DIAGNOSIS — R293 Abnormal posture: Secondary | ICD-10-CM | POA: Diagnosis not present

## 2022-06-04 DIAGNOSIS — C50412 Malignant neoplasm of upper-outer quadrant of left female breast: Secondary | ICD-10-CM | POA: Insufficient documentation

## 2022-06-04 DIAGNOSIS — Z170 Estrogen receptor positive status [ER+]: Secondary | ICD-10-CM | POA: Diagnosis not present

## 2022-06-05 ENCOUNTER — Other Ambulatory Visit: Payer: Self-pay

## 2022-06-05 ENCOUNTER — Encounter (HOSPITAL_BASED_OUTPATIENT_CLINIC_OR_DEPARTMENT_OTHER): Payer: Self-pay | Admitting: Surgery

## 2022-06-06 ENCOUNTER — Encounter (HOSPITAL_BASED_OUTPATIENT_CLINIC_OR_DEPARTMENT_OTHER)
Admission: RE | Admit: 2022-06-06 | Discharge: 2022-06-06 | Disposition: A | Discharge status: Home / Self Care | Payer: Medicare Other | Source: Ambulatory Visit | Attending: Surgery | Admitting: Surgery

## 2022-06-06 DIAGNOSIS — Z87891 Personal history of nicotine dependence: Secondary | ICD-10-CM | POA: Diagnosis not present

## 2022-06-06 DIAGNOSIS — I10 Essential (primary) hypertension: Secondary | ICD-10-CM | POA: Diagnosis not present

## 2022-06-06 DIAGNOSIS — Z170 Estrogen receptor positive status [ER+]: Secondary | ICD-10-CM | POA: Insufficient documentation

## 2022-06-06 DIAGNOSIS — C50412 Malignant neoplasm of upper-outer quadrant of left female breast: Secondary | ICD-10-CM | POA: Insufficient documentation

## 2022-06-12 ENCOUNTER — Ambulatory Visit
Admission: RE | Admit: 2022-06-12 | Discharge: 2022-06-12 | Disposition: A | Discharge status: Home / Self Care | Payer: Medicare Other | Source: Ambulatory Visit | Attending: Surgery | Admitting: Surgery

## 2022-06-12 DIAGNOSIS — C50912 Malignant neoplasm of unspecified site of left female breast: Secondary | ICD-10-CM | POA: Diagnosis not present

## 2022-06-12 HISTORY — PX: BREAST BIOPSY: SHX20

## 2022-06-12 NOTE — Anesthesia Preprocedure Evaluation (Signed)
Anesthesia Evaluation  Patient identified by MRN, date of birth, ID band Patient awake    Reviewed: Allergy & Precautions, NPO status , Patient's Chart, lab work & pertinent test results  Airway Mallampati: II  TM Distance: >3 FB Neck ROM: Full    Dental no notable dental hx. (+) Teeth Intact, Dental Advisory Given   Pulmonary former smoker   Pulmonary exam normal breath sounds clear to auscultation       Cardiovascular hypertension, Normal cardiovascular exam Rhythm:Regular Rate:Normal     Neuro/Psych  PSYCHIATRIC DISORDERS         GI/Hepatic negative GI ROS, Neg liver ROS,,,  Endo/Other  negative endocrine ROS    Renal/GU negative Renal ROS     Musculoskeletal   Abdominal   Peds  Hematology negative hematology ROS (+)   Anesthesia Other Findings All: sulfa  Reproductive/Obstetrics                             Anesthesia Physical Anesthesia Plan  ASA: 3  Anesthesia Plan: General and Regional   Post-op Pain Management: Regional block*, Precedex and Ofirmev IV (intra-op)*   Induction: Intravenous  PONV Risk Score and Plan: 4 or greater and Treatment may vary due to age or medical condition, Midazolam, Ondansetron, Propofol infusion and TIVA  Airway Management Planned: LMA  Additional Equipment: None  Intra-op Plan:   Post-operative Plan:   Informed Consent: I have reviewed the patients History and Physical, chart, labs and discussed the procedure including the risks, benefits and alternatives for the proposed anesthesia with the patient or authorized representative who has indicated his/her understanding and acceptance.     Dental advisory given  Plan Discussed with:   Anesthesia Plan Comments: (TIVA LMA w L PEC block)       Anesthesia Quick Evaluation

## 2022-06-13 ENCOUNTER — Encounter (HOSPITAL_BASED_OUTPATIENT_CLINIC_OR_DEPARTMENT_OTHER)
Admission: RE | Disposition: A | Discharge status: Home / Self Care | Payer: Self-pay | Source: Ambulatory Visit | Attending: Surgery

## 2022-06-13 ENCOUNTER — Other Ambulatory Visit: Payer: Self-pay

## 2022-06-13 ENCOUNTER — Ambulatory Visit (HOSPITAL_BASED_OUTPATIENT_CLINIC_OR_DEPARTMENT_OTHER)
Admission: RE | Admit: 2022-06-13 | Discharge: 2022-06-13 | Disposition: A | Discharge status: Home / Self Care | Payer: Medicare Other | Source: Ambulatory Visit | Attending: Surgery | Admitting: Surgery

## 2022-06-13 ENCOUNTER — Ambulatory Visit
Admission: RE | Admit: 2022-06-13 | Discharge: 2022-06-13 | Disposition: A | Discharge status: Home / Self Care | Payer: Medicare Other | Source: Ambulatory Visit | Attending: Surgery | Admitting: Surgery

## 2022-06-13 ENCOUNTER — Ambulatory Visit (HOSPITAL_BASED_OUTPATIENT_CLINIC_OR_DEPARTMENT_OTHER): Payer: Medicare Other | Admitting: Anesthesiology

## 2022-06-13 ENCOUNTER — Encounter (HOSPITAL_BASED_OUTPATIENT_CLINIC_OR_DEPARTMENT_OTHER): Payer: Self-pay | Admitting: Surgery

## 2022-06-13 DIAGNOSIS — G8918 Other acute postprocedural pain: Secondary | ICD-10-CM | POA: Diagnosis not present

## 2022-06-13 DIAGNOSIS — R928 Other abnormal and inconclusive findings on diagnostic imaging of breast: Secondary | ICD-10-CM | POA: Diagnosis not present

## 2022-06-13 DIAGNOSIS — Z87891 Personal history of nicotine dependence: Secondary | ICD-10-CM | POA: Diagnosis not present

## 2022-06-13 DIAGNOSIS — C50412 Malignant neoplasm of upper-outer quadrant of left female breast: Secondary | ICD-10-CM

## 2022-06-13 DIAGNOSIS — C50912 Malignant neoplasm of unspecified site of left female breast: Secondary | ICD-10-CM

## 2022-06-13 DIAGNOSIS — Z01818 Encounter for other preprocedural examination: Secondary | ICD-10-CM

## 2022-06-13 DIAGNOSIS — Z170 Estrogen receptor positive status [ER+]: Secondary | ICD-10-CM

## 2022-06-13 DIAGNOSIS — I10 Essential (primary) hypertension: Secondary | ICD-10-CM | POA: Diagnosis not present

## 2022-06-13 DIAGNOSIS — E785 Hyperlipidemia, unspecified: Secondary | ICD-10-CM

## 2022-06-13 HISTORY — PX: BREAST LUMPECTOMY WITH RADIOACTIVE SEED AND SENTINEL LYMPH NODE BIOPSY: SHX6550

## 2022-06-13 SURGERY — BREAST LUMPECTOMY WITH RADIOACTIVE SEED AND SENTINEL LYMPH NODE BIOPSY
Anesthesia: Regional | Site: Breast | Laterality: Left

## 2022-06-13 MED ORDER — 0.9 % SODIUM CHLORIDE (POUR BTL) OPTIME
TOPICAL | Status: DC | PRN
  Administered 2022-06-13: 500 mL

## 2022-06-13 MED ORDER — CEFAZOLIN SODIUM-DEXTROSE 2-4 GM/100ML-% IV SOLN
INTRAVENOUS | Status: AC
  Filled 2022-06-13: qty 100

## 2022-06-13 MED ORDER — CHLORHEXIDINE GLUCONATE CLOTH 2 % EX PADS: 6.0000 | MEDICATED_PAD | Freq: Once | CUTANEOUS | Status: DC

## 2022-06-13 MED ORDER — ACETAMINOPHEN 10 MG/ML IV SOLN
INTRAVENOUS | Status: DC | PRN
  Administered 2022-06-13: 1000 mg via INTRAVENOUS

## 2022-06-13 MED ORDER — LACTATED RINGERS IV SOLN: INTRAVENOUS | Status: DC

## 2022-06-13 MED ORDER — DEXAMETHASONE SODIUM PHOSPHATE 4 MG/ML IJ SOLN
INTRAMUSCULAR | Status: DC | PRN
  Administered 2022-06-13: 5 mg via INTRAVENOUS

## 2022-06-13 MED ORDER — ACETAMINOPHEN 10 MG/ML IV SOLN: 1000.0000 mg | Freq: Once | INTRAVENOUS | Status: DC | PRN

## 2022-06-13 MED ORDER — MIDAZOLAM HCL 2 MG/2ML IJ SOLN
INTRAMUSCULAR | Status: AC
  Filled 2022-06-13: qty 2

## 2022-06-13 MED ORDER — PHENYLEPHRINE HCL-NACL 20-0.9 MG/250ML-% IV SOLN
INTRAVENOUS | Status: DC | PRN
  Administered 2022-06-13: 40 ug/min via INTRAVENOUS

## 2022-06-13 MED ORDER — LIDOCAINE 2% (20 MG/ML) 5 ML SYRINGE
INTRAMUSCULAR | Status: AC
  Filled 2022-06-13: qty 5

## 2022-06-13 MED ORDER — PROPOFOL 500 MG/50ML IV EMUL
INTRAVENOUS | Status: DC | PRN
  Administered 2022-06-13: 200 ug/kg/min via INTRAVENOUS

## 2022-06-13 MED ORDER — BUPIVACAINE LIPOSOME 1.3 % IJ SUSP
INTRAMUSCULAR | Status: DC | PRN
  Administered 2022-06-13: 10 mL

## 2022-06-13 MED ORDER — FENTANYL CITRATE (PF) 100 MCG/2ML IJ SOLN
INTRAMUSCULAR | Status: DC | PRN
  Administered 2022-06-13: 50 ug via INTRAVENOUS

## 2022-06-13 MED ORDER — FENTANYL CITRATE (PF) 100 MCG/2ML IJ SOLN
INTRAMUSCULAR | Status: AC
  Filled 2022-06-13: qty 2

## 2022-06-13 MED ORDER — FENTANYL CITRATE (PF) 100 MCG/2ML IJ SOLN
50.0000 ug | Freq: Once | INTRAMUSCULAR | Status: AC
  Administered 2022-06-13: 50 ug via INTRAVENOUS

## 2022-06-13 MED ORDER — PHENYLEPHRINE HCL (PRESSORS) 10 MG/ML IV SOLN
INTRAVENOUS | Status: DC | PRN
  Administered 2022-06-13: 80 ug via INTRAVENOUS

## 2022-06-13 MED ORDER — SODIUM CHLORIDE 0.9 % IV SOLN
INTRAVENOUS | Status: AC
  Filled 2022-06-13: qty 10

## 2022-06-13 MED ORDER — DEXMEDETOMIDINE HCL IN NACL 80 MCG/20ML IV SOLN
INTRAVENOUS | Status: DC | PRN
  Administered 2022-06-13: 8 ug via BUCCAL

## 2022-06-13 MED ORDER — ONDANSETRON HCL 4 MG/2ML IJ SOLN: 4.0000 mg | Freq: Once | INTRAMUSCULAR | Status: DC | PRN

## 2022-06-13 MED ORDER — OXYCODONE HCL 5 MG PO TABS: 5.0000 mg | ORAL_TABLET | Freq: Four times a day (QID) | ORAL | 0 refills | Status: DC | PRN

## 2022-06-13 MED ORDER — SODIUM CHLORIDE 0.9 % IV SOLN
INTRAVENOUS | Status: DC | PRN
  Administered 2022-06-13: 500 mL

## 2022-06-13 MED ORDER — ACETAMINOPHEN 10 MG/ML IV SOLN
INTRAVENOUS | Status: AC
  Filled 2022-06-13: qty 100

## 2022-06-13 MED ORDER — MAGTRACE LYMPHATIC TRACER
INTRAMUSCULAR | Status: DC | PRN
  Administered 2022-06-13: 2 mL via INTRAMUSCULAR

## 2022-06-13 MED ORDER — FENTANYL CITRATE (PF) 100 MCG/2ML IJ SOLN: 25.0000 ug | INTRAMUSCULAR | Status: DC | PRN

## 2022-06-13 MED ORDER — SUCCINYLCHOLINE CHLORIDE 200 MG/10ML IV SOSY
PREFILLED_SYRINGE | INTRAVENOUS | Status: AC
  Filled 2022-06-13: qty 10

## 2022-06-13 MED ORDER — ONDANSETRON HCL 4 MG/2ML IJ SOLN
INTRAMUSCULAR | Status: AC
  Filled 2022-06-13: qty 2

## 2022-06-13 MED ORDER — PHENYLEPHRINE HCL (PRESSORS) 10 MG/ML IV SOLN
INTRAVENOUS | Status: AC
  Filled 2022-06-13: qty 1

## 2022-06-13 MED ORDER — EPHEDRINE 5 MG/ML INJ
INTRAVENOUS | Status: AC
  Filled 2022-06-13: qty 5

## 2022-06-13 MED ORDER — LIDOCAINE HCL (CARDIAC) PF 100 MG/5ML IV SOSY
PREFILLED_SYRINGE | INTRAVENOUS | Status: DC | PRN
  Administered 2022-06-13: 40 mg via INTRAVENOUS

## 2022-06-13 MED ORDER — DEXAMETHASONE SODIUM PHOSPHATE 10 MG/ML IJ SOLN
INTRAMUSCULAR | Status: AC
  Filled 2022-06-13: qty 1

## 2022-06-13 MED ORDER — BUPIVACAINE HCL (PF) 0.25 % IJ SOLN
INTRAMUSCULAR | Status: DC | PRN
  Administered 2022-06-13: 20 mL via PERINEURAL

## 2022-06-13 MED ORDER — BUPIVACAINE-EPINEPHRINE (PF) 0.25% -1:200000 IJ SOLN
INTRAMUSCULAR | Status: DC | PRN
  Administered 2022-06-13: 21 mL

## 2022-06-13 MED ORDER — CEFAZOLIN SODIUM-DEXTROSE 2-4 GM/100ML-% IV SOLN
2.0000 g | INTRAVENOUS | Status: AC
  Administered 2022-06-13: 2 g via INTRAVENOUS

## 2022-06-13 MED ORDER — EPHEDRINE SULFATE (PRESSORS) 50 MG/ML IJ SOLN
INTRAMUSCULAR | Status: DC | PRN
  Administered 2022-06-13: 10 mg via INTRAVENOUS

## 2022-06-13 MED ORDER — PHENYLEPHRINE 80 MCG/ML (10ML) SYRINGE FOR IV PUSH (FOR BLOOD PRESSURE SUPPORT)
PREFILLED_SYRINGE | INTRAVENOUS | Status: AC
  Filled 2022-06-13: qty 10

## 2022-06-13 MED ORDER — ONDANSETRON HCL 4 MG/2ML IJ SOLN
INTRAMUSCULAR | Status: DC | PRN
  Administered 2022-06-13: 4 mg via INTRAVENOUS

## 2022-06-13 SURGICAL SUPPLY — 49 items
ADH SKN CLS APL DERMABOND .7 (GAUZE/BANDAGES/DRESSINGS) ×1
APL PRP STRL LF DISP 70% ISPRP (MISCELLANEOUS) ×1
APPLIER CLIP 9.375 MED OPEN (MISCELLANEOUS) ×1
APR CLP MED 9.3 20 MLT OPN (MISCELLANEOUS) ×1
BINDER BREAST LRG (GAUZE/BANDAGES/DRESSINGS) IMPLANT
BINDER BREAST MEDIUM (GAUZE/BANDAGES/DRESSINGS) IMPLANT
BINDER BREAST XLRG (GAUZE/BANDAGES/DRESSINGS) IMPLANT
BINDER BREAST XXLRG (GAUZE/BANDAGES/DRESSINGS) IMPLANT
BLADE SURG 15 STRL LF DISP TIS (BLADE) ×1 IMPLANT
BLADE SURG 15 STRL SS (BLADE) ×1
CANISTER SUC SOCK COL 7IN (MISCELLANEOUS) IMPLANT
CANISTER SUCT 1200ML W/VALVE (MISCELLANEOUS) ×1 IMPLANT
CHLORAPREP W/TINT 26 (MISCELLANEOUS) ×1 IMPLANT
CLIP APPLIE 9.375 MED OPEN (MISCELLANEOUS) ×1 IMPLANT
COVER BACK TABLE 60X90IN (DRAPES) ×1 IMPLANT
COVER MAYO STAND STRL (DRAPES) ×1 IMPLANT
COVER PROBE CYLINDRICAL 5X96 (MISCELLANEOUS) ×1 IMPLANT
DERMABOND ADVANCED .7 DNX12 (GAUZE/BANDAGES/DRESSINGS) ×1 IMPLANT
DRAPE LAPAROSCOPIC ABDOMINAL (DRAPES) ×1 IMPLANT
DRAPE UTILITY XL STRL (DRAPES) ×1 IMPLANT
ELECT COATED BLADE 2.86 ST (ELECTRODE) ×1 IMPLANT
ELECT REM PT RETURN 9FT ADLT (ELECTROSURGICAL) ×1
ELECTRODE REM PT RTRN 9FT ADLT (ELECTROSURGICAL) ×1 IMPLANT
GLOVE BIOGEL PI IND STRL 8 (GLOVE) ×1 IMPLANT
GLOVE ECLIPSE 8.0 STRL XLNG CF (GLOVE) ×1 IMPLANT
GOWN STRL REUS W/ TWL LRG LVL3 (GOWN DISPOSABLE) ×2 IMPLANT
GOWN STRL REUS W/ TWL XL LVL3 (GOWN DISPOSABLE) ×1 IMPLANT
GOWN STRL REUS W/TWL LRG LVL3 (GOWN DISPOSABLE) ×2
GOWN STRL REUS W/TWL XL LVL3 (GOWN DISPOSABLE) ×1
HEMOSTAT ARISTA ABSORB 3G PWDR (HEMOSTASIS) IMPLANT
HEMOSTAT SNOW SURGICEL 2X4 (HEMOSTASIS) IMPLANT
KIT MARKER MARGIN INK (KITS) ×1 IMPLANT
NDL HYPO 25X1 1.5 SAFETY (NEEDLE) ×1 IMPLANT
NDL SAFETY ECLIP 18X1.5 (MISCELLANEOUS) IMPLANT
NEEDLE HYPO 25X1 1.5 SAFETY (NEEDLE) ×1 IMPLANT
NS IRRIG 1000ML POUR BTL (IV SOLUTION) ×1 IMPLANT
PACK BASIN DAY SURGERY FS (CUSTOM PROCEDURE TRAY) ×1 IMPLANT
PENCIL SMOKE EVACUATOR (MISCELLANEOUS) ×1 IMPLANT
SLEEVE SCD COMPRESS KNEE MED (STOCKING) ×1 IMPLANT
SPIKE FLUID TRANSFER (MISCELLANEOUS) IMPLANT
SPONGE T-LAP 4X18 ~~LOC~~+RFID (SPONGE) ×1 IMPLANT
SUT MNCRL AB 4-0 PS2 18 (SUTURE) ×1 IMPLANT
SUT VICRYL 3-0 CR8 SH (SUTURE) ×1 IMPLANT
SYR CONTROL 10ML LL (SYRINGE) ×1 IMPLANT
TOWEL GREEN STERILE FF (TOWEL DISPOSABLE) ×1 IMPLANT
TRACER MAGTRACE VIAL (MISCELLANEOUS) IMPLANT
TRAY FAXITRON CT DISP (TRAY / TRAY PROCEDURE) ×1 IMPLANT
TUBE CONNECTING 20X1/4 (TUBING) ×1 IMPLANT
YANKAUER SUCT BULB TIP NO VENT (SUCTIONS) ×1 IMPLANT

## 2022-06-13 NOTE — Anesthesia Postprocedure Evaluation (Signed)
Anesthesia Post Note  Patient: SENIA EVEN  Procedure(s) Performed: LEFT BREAST LUMPECTOMY WITH RADIOACTIVE SEED AND SENTINEL LYMPH NODE BIOPSY (Left: Breast)     Patient location during evaluation: PACU Anesthesia Type: Regional and General Level of consciousness: awake and alert Pain management: pain level controlled Vital Signs Assessment: post-procedure vital signs reviewed and stable Respiratory status: spontaneous breathing, nonlabored ventilation, respiratory function stable and patient connected to nasal cannula oxygen Cardiovascular status: blood pressure returned to baseline and stable Postop Assessment: no apparent nausea or vomiting Anesthetic complications: no  No notable events documented.  Last Vitals:  Vitals:   06/13/22 1530 06/13/22 1544  BP: 117/81 (!) 142/77  Pulse: 86 93  Resp: 17 19  Temp:  36.5 C  SpO2: 96% 94%    Last Pain:  Vitals:   06/13/22 1544  TempSrc: Oral  PainSc: 0-No pain                 Barnet [place]

## 2022-06-13 NOTE — Anesthesia Procedure Notes (Signed)
Procedure Name: LMA Insertion Date/Time: 06/13/2022 1:27 PM  Performed by: Willa Frater, CRNAPre-anesthesia Checklist: Patient identified, Emergency Drugs available, Suction available and Patient being monitored Patient Re-evaluated:Patient Re-evaluated prior to induction Oxygen Delivery Method: Circle system utilized Preoxygenation: Pre-oxygenation with 100% oxygen Induction Type: IV induction Ventilation: Mask ventilation without difficulty LMA: LMA inserted LMA Size: 4.0 Number of attempts: 1 Airway Equipment and Method: Bite block Placement Confirmation: positive ETCO2 Tube secured with: Tape Dental Injury: Teeth and Oropharynx as per pre-operative assessment

## 2022-06-13 NOTE — Op Note (Signed)
History of Present Illness: Kara Harvey is a 81 y.o. female who is seen today as an office consultation for evaluation of New Consultation and Breast Cancer .   Patient presents for evaluation of left breast cancer. A density was noted multiple in the left breast upper outer quadrant. 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 A second biopsy adjacent to it showed fibrocystic change. No family history of breast cancer. No history of breast pain, breast mass or nipple discharge bilaterally.  Review of Systems: A complete review of systems was obtained from the patient. I have reviewed this information and discussed as appropriate with the patient. See HPI as well for other ROS.    Medical History: Past Medical History:  Diagnosis Date  Anxiety  HTN, goal below 140/90 07/17/2017  Right supracondylar humerus fracture, closed, initial encounter 07/17/2017   Patient Active Problem List  Diagnosis  Right supracondylar humerus fracture, closed, initial encounter  HTN, goal below 140/90  Syncope   Past Surgical History:  Procedure Laterality Date  broken ankle 2014  TONSILLECTOMY  TUBAL LIGATION    Allergies  Allergen Reactions  Sulfa (Sulfonamide Antibiotics) Rash   Current Outpatient Medications on File Prior to Visit  Medication Sig Dispense Refill  ALPRAZolam (XANAX) 0.5 MG tablet TAKE 1 TABLET(0.25 MG) BY MOUTH DAILY AS NEEDED FOR ANXIETY  amLODIPine (NORVASC) 5 MG tablet Take 5 mg by mouth once daily  atorvastatin (LIPITOR) 20 MG tablet  buPROPion (WELLBUTRIN SR) 100 MG SR tablet Take 100 mg by mouth 2 (two) times daily  losartan (COZAAR) 100 MG tablet Take 100 mg by mouth once daily  ondansetron (ZOFRAN) 4 MG tablet  valACYclovir (VALTREX) 1000 MG tablet  cholecalciferol (VITAMIN D3) 1000 unit capsule Take by mouth  famotidine (PEPCID) 20 MG tablet Take 20 mg by mouth 2 (two) times daily   No current  facility-administered medications on file prior to visit.   Family History  Problem Relation Age of Onset  Hyperlipidemia (Elevated cholesterol) Mother    Social History   Tobacco Use  Smoking Status Former  Types: Cigarettes  Quit date: 1980  Years since quitting: 43.7  Smokeless Tobacco Never    Social History   Socioeconomic History  Marital status: Married  Tobacco Use  Smoking status: Former  Types: Cigarettes  Quit date: 1980  Years since quitting: 43.7  Smokeless tobacco: Never  Substance and Sexual Activity  Alcohol use: Not Currently  Drug use: Never   Objective:   Vitals:  04/16/22 1443  BP: (!) 160/80  Pulse: 87  Temp: 36.7 C (98 F)  SpO2: 96%  Weight: 81.2 kg (179 lb)  Height: 157.5 cm (_0 )   Body mass index is 32.74 kg/m.  Physical Exam HENT:  Head: Normocephalic.  Eyes:  Pupils: Pupils are equal, round, and reactive to light.  Cardiovascular:  Rate and Rhythm: Normal rate.  Pulmonary:  Effort: Pulmonary effort is normal.  Chest:  Breasts: Right: Normal. No tenderness.  Left: No tenderness.  Comments: Bruising noted left breast mild Musculoskeletal:  General: Normal range of motion.  Cervical back: Normal range of motion.  Skin: General: Skin is warm.  Neurological:  General: No focal deficit present.  Mental Status: She is alert.    Labs, Imaging and Diagnostic Testing:  CLINICAL DATA: Possible distortion in the posterior upper left breast on a recent screening mammogram. The patient has a faint scar in the periareolar region of  the left breast from excisional biopsy approximately 40 years ago.  EXAM: DIGITAL DIAGNOSTIC UNILATERAL LEFT MAMMOGRAM WITH TOMOSYNTHESIS; ULTRASOUND LEFT BREAST LIMITED  TECHNIQUE: Left digital diagnostic mammography and breast tomosynthesis was performed.; Targeted ultrasound examination of the left breast was performed.  COMPARISON: Previous exam(s).  ACR Breast Density Category c:  The breast tissue is heterogeneously dense, which may obscure small masses.  FINDINGS: 3D tomographic and 2D generated true lateral and spot compression images of the left breast confirm an area of distortion in the 12 o'clock position mid to posterior depth. The distortion is less pronounced on these images than on the recent screening mammogram. There is also a small number of punctate rounded calcifications within that area.  On physical exam, no mass is palpable in the upper left breast or left axilla.  Targeted ultrasound is performed, showing a 1.7 x 1.4 x 0.9 cm irregular, hypoechoic mass with mild surrounding ill-defined increased echogenicity in the 11 o'clock position of the left breast, 10 cm from the nipple. On repeat scanning, this was in the 11:30 o'clock position, 8 cm from the nipple.  There is also an irregular hypoechoic mass with ill-defined peripheral increased echogenicity and posterior acoustical shadowing in the 1 o'clock position of the left breast, 10 cm from the nipple. This measures 1.4 x 1.2 x 0.7 cm.  Ultrasound of the left axilla demonstrated normal appearing left axillary lymph nodes.  IMPRESSION: 1. Architectural distortion with a small number of associated punctate rounded calcifications in the 12 o'clock position of the left breast at mammography, suspicious for malignancy. 2. 1.7 cm irregular mass in the 11:30 o'clock position of the left breast, 8 cm from the nipple with ultrasound features suspicious for malignancy, most likely corresponding to the area of mammographic distortion and calcifications. 3. Additional 1.4 cm irregular mass in the 1 o'clock position of the left breast, 10 cm from the nipple, suspicious for malignancy. 4. No left axillary adenopathy.  RECOMMENDATION: Ultrasound-guided core needle biopsy of the 1.7 cm mass in the 11:30 o'clock position of the left breast and ultrasound-guided core needle biopsy of the 1.4 cm  mass in the 1 o'clock position of the left breast. This has been discussed with the patient and the biopsies have been scheduled at 11:30 a.m. on 04/11/2022.  I have discussed the findings and recommendations with the patient. If applicable, a reminder letter will be sent to the patient regarding the next appointment.  BI-RADS CATEGORY 4: Suspicious.   Electronically Signed By: Claudie Revering M.D. On: 04/05/2022 15:43  ADDITIONAL INFORMATION: 1. PROGNOSTIC INDICATORS Results: IMMUNOHISTOCHEMICAL AND MORPHOMETRIC ANALYSIS PERFORMED MANUALLY The tumor cells are equivocal for Her2 (2+). Her2 by FISH will be performed and the results reported separately. Estrogen Receptor: 60%, POSITIVE, STRONG STAINING INTENSITY Progesterone Receptor: 0%, NEGATIVE Proliferation Marker Ki67: 15% COMMENT: The negative hormone receptor study(ies) in this case has an internal positive control. REFERENCE RANGE ESTROGEN RECEPTOR NEGATIVE 0% POSITIVE =>1% REFERENCE RANGE PROGESTERONE RECEPTOR NEGATIVE 0% POSITIVE =>1% All controls stained appropriately Casimer Lanius MD Pathologist, Electronic Signature ( Signed 04/13/2022) FINAL DIAGNOSIS Diagnosis 1. Breast, left, needle core biopsy, 1.7 cm mass 11:30 o'clock, ribbon clip INVASIVE LOBULAR CARCINOMA, GRADE 2 (3+2+1) 1 of 3 FINAL for Dunshee, Kaysha S ([id number]) Diagnosis(continued) NEGATIVE FOR MICROCALCIFICATIONS NEGATIVE FOR LYMPHOVASCULAR INVASION TUMOR MEASURES 8.5 MM IN GREATEST LINEAR EXTENT 2. Breast, left, needle core biopsy, 1.4 cm mass 1 o'clock left breast, coil clip BENIGN BREAST WITH FIBROCYSTIC CHANGES INCLUDING CYSTIC DILATATION OF DUCTS, STROMAL FIBROSIS AND FOCAL  ADENOSIS NEGATIVE FOR MICROCALCIFICATIONS NEGATIVE FOR CARCINOMA Microscopic Comment 1. An E-cadherin immunohistochemical stain performed with adequate control is negative within the tumor supporting a lobular differentiation.  Assessment and Plan:   Diagnoses and  all orders for this visit:  Malignant neoplasm of upper-outer quadrant of left breast in female, estrogen receptor positive     Plan MRI  Discussed breast conserving surgery versus mastectomy with reconstruction. Reviewed long-term survival, local regional recurrence and the need for sentinel lymph node mapping. Discussed seed use with lumpectomy. Neck step obtain MRI and then determine surgical approach. She has ample size breast so a large lumpectomy is possible as well. Consider radiation therapy and chemotherapy. Make referral to specialist once more information to obtain.The procedure has been discussed with the patient. Alternatives to surgery have been discussed with the patient. Risks of surgery include bleeding, Infection, Seroma formation, death, and the need for further surgery. Discussed lymphedema, shoulder stiffness, and complications of injury to structures in the axilla with some lymph node mapping. The patient understands and wishes to proceed.   No follow-ups on file.  Kennieth Francois, MD

## 2022-06-13 NOTE — Progress Notes (Signed)
Assisted Dr. Valma Cava with left, pectoralis, ultrasound guided block. Side rails up, monitors on throughout procedure. See vital signs in flow sheet. Tolerated Procedure well.

## 2022-06-13 NOTE — H&P (Signed)
History of Present Illness: Kara Harvey is a 81 y.o. female who is seen today as an office consultation for evaluation of New Consultation and Breast Cancer .   Patient presents for evaluation of left breast cancer. A density was noted multiple in the left breast upper outer quadrant. 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 A second biopsy adjacent to it showed fibrocystic change. No family history of breast cancer. No history of breast pain, breast mass or nipple discharge bilaterally.  Review of Systems: A complete review of systems was obtained from the patient. I have reviewed this information and discussed as appropriate with the patient. See HPI as well for other ROS.    Medical History: Past Medical History:  Diagnosis Date  Anxiety  HTN, goal below 140/90 07/17/2017  Right supracondylar humerus fracture, closed, initial encounter 07/17/2017   Patient Active Problem List  Diagnosis  Right supracondylar humerus fracture, closed, initial encounter  HTN, goal below 140/90  Syncope   Past Surgical History:  Procedure Laterality Date  broken ankle 2014  TONSILLECTOMY  TUBAL LIGATION    Allergies  Allergen Reactions  Sulfa (Sulfonamide Antibiotics) Rash   Current Outpatient Medications on File Prior to Visit  Medication Sig Dispense Refill  ALPRAZolam (XANAX) 0.5 MG tablet TAKE 1 TABLET(0.25 MG) BY MOUTH DAILY AS NEEDED FOR ANXIETY  amLODIPine (NORVASC) 5 MG tablet Take 5 mg by mouth once daily  atorvastatin (LIPITOR) 20 MG tablet  buPROPion (WELLBUTRIN SR) 100 MG SR tablet Take 100 mg by mouth 2 (two) times daily  losartan (COZAAR) 100 MG tablet Take 100 mg by mouth once daily  ondansetron (ZOFRAN) 4 MG tablet  valACYclovir (VALTREX) 1000 MG tablet  cholecalciferol (VITAMIN D3) 1000 unit capsule Take by mouth  famotidine (PEPCID) 20 MG tablet Take 20 mg by mouth 2 (two) times daily   No current  facility-administered medications on file prior to visit.   Family History  Problem Relation Age of Onset  Hyperlipidemia (Elevated cholesterol) Mother    Social History   Tobacco Use  Smoking Status Former  Types: Cigarettes  Quit date: 1980  Years since quitting: 43.7  Smokeless Tobacco Never    Social History   Socioeconomic History  Marital status: Married  Tobacco Use  Smoking status: Former  Types: Cigarettes  Quit date: 1980  Years since quitting: 43.7  Smokeless tobacco: Never  Substance and Sexual Activity  Alcohol use: Not Currently  Drug use: Never   Objective:   Vitals:  04/16/22 1443  BP: (!) 160/80  Pulse: 87  Temp: 36.7 C (98 F)  SpO2: 96%  Weight: 81.2 kg (179 lb)  Height: 157.5 cm (_0 )   Body mass index is 32.74 kg/m.  Physical Exam HENT:  Head: Normocephalic.  Eyes:  Pupils: Pupils are equal, round, and reactive to light.  Cardiovascular:  Rate and Rhythm: Normal rate.  Pulmonary:  Effort: Pulmonary effort is normal.  Chest:  Breasts: Right: Normal. No tenderness.  Left: No tenderness.  Comments: Bruising noted left breast mild Musculoskeletal:  General: Normal range of motion.  Cervical back: Normal range of motion.  Skin: General: Skin is warm.  Neurological:  General: No focal deficit present.  Mental Status: She is alert.    Labs, Imaging and Diagnostic Testing:  CLINICAL DATA: Possible distortion in the posterior upper left breast on a recent screening mammogram. The patient has a faint scar in the periareolar region of  the left breast from excisional biopsy approximately 40 years ago.  EXAM: DIGITAL DIAGNOSTIC UNILATERAL LEFT MAMMOGRAM WITH TOMOSYNTHESIS; ULTRASOUND LEFT BREAST LIMITED  TECHNIQUE: Left digital diagnostic mammography and breast tomosynthesis was performed.; Targeted ultrasound examination of the left breast was performed.  COMPARISON: Previous exam(s).  ACR Breast Density Category c:  The breast tissue is heterogeneously dense, which may obscure small masses.  FINDINGS: 3D tomographic and 2D generated true lateral and spot compression images of the left breast confirm an area of distortion in the 12 o'clock position mid to posterior depth. The distortion is less pronounced on these images than on the recent screening mammogram. There is also a small number of punctate rounded calcifications within that area.  On physical exam, no mass is palpable in the upper left breast or left axilla.  Targeted ultrasound is performed, showing a 1.7 x 1.4 x 0.9 cm irregular, hypoechoic mass with mild surrounding ill-defined increased echogenicity in the 11 o'clock position of the left breast, 10 cm from the nipple. On repeat scanning, this was in the 11:30 o'clock position, 8 cm from the nipple.  There is also an irregular hypoechoic mass with ill-defined peripheral increased echogenicity and posterior acoustical shadowing in the 1 o'clock position of the left breast, 10 cm from the nipple. This measures 1.4 x 1.2 x 0.7 cm.  Ultrasound of the left axilla demonstrated normal appearing left axillary lymph nodes.  IMPRESSION: 1. Architectural distortion with a small number of associated punctate rounded calcifications in the 12 o'clock position of the left breast at mammography, suspicious for malignancy. 2. 1.7 cm irregular mass in the 11:30 o'clock position of the left breast, 8 cm from the nipple with ultrasound features suspicious for malignancy, most likely corresponding to the area of mammographic distortion and calcifications. 3. Additional 1.4 cm irregular mass in the 1 o'clock position of the left breast, 10 cm from the nipple, suspicious for malignancy. 4. No left axillary adenopathy.  RECOMMENDATION: Ultrasound-guided core needle biopsy of the 1.7 cm mass in the 11:30 o'clock position of the left breast and ultrasound-guided core needle biopsy of the 1.4 cm  mass in the 1 o'clock position of the left breast. This has been discussed with the patient and the biopsies have been scheduled at 11:30 a.m. on 04/11/2022.  I have discussed the findings and recommendations with the patient. If applicable, a reminder letter will be sent to the patient regarding the next appointment.  BI-RADS CATEGORY 4: Suspicious.   Electronically Signed By: Claudie Revering M.D. On: 04/05/2022 15:43  ADDITIONAL INFORMATION: 1. PROGNOSTIC INDICATORS Results: IMMUNOHISTOCHEMICAL AND MORPHOMETRIC ANALYSIS PERFORMED MANUALLY The tumor cells are equivocal for Her2 (2+). Her2 by FISH will be performed and the results reported separately. Estrogen Receptor: 60%, POSITIVE, STRONG STAINING INTENSITY Progesterone Receptor: 0%, NEGATIVE Proliferation Marker Ki67: 15% COMMENT: The negative hormone receptor study(ies) in this case has an internal positive control. REFERENCE RANGE ESTROGEN RECEPTOR NEGATIVE 0% POSITIVE =>1% REFERENCE RANGE PROGESTERONE RECEPTOR NEGATIVE 0% POSITIVE =>1% All controls stained appropriately Casimer Lanius MD Pathologist, Electronic Signature ( Signed 04/13/2022) FINAL DIAGNOSIS Diagnosis 1. Breast, left, needle core biopsy, 1.7 cm mass 11:30 o'clock, ribbon clip INVASIVE LOBULAR CARCINOMA, GRADE 2 (3+2+1) 1 of 3 FINAL for Sleeth, Sakiya S ([id number]) Diagnosis(continued) NEGATIVE FOR MICROCALCIFICATIONS NEGATIVE FOR LYMPHOVASCULAR INVASION TUMOR MEASURES 8.5 MM IN GREATEST LINEAR EXTENT 2. Breast, left, needle core biopsy, 1.4 cm mass 1 o'clock left breast, coil clip BENIGN BREAST WITH FIBROCYSTIC CHANGES INCLUDING CYSTIC DILATATION OF DUCTS, STROMAL FIBROSIS AND FOCAL  ADENOSIS NEGATIVE FOR MICROCALCIFICATIONS NEGATIVE FOR CARCINOMA Microscopic Comment 1. An E-cadherin immunohistochemical stain performed with adequate control is negative within the tumor supporting a lobular differentiation.  Assessment and Plan:   Diagnoses and  all orders for this visit:  Malignant neoplasm of upper-outer quadrant of left breast in female, estrogen receptor positive     Plan MRI  Discussed breast conserving surgery versus mastectomy with reconstruction. Reviewed long-term survival, local regional recurrence and the need for sentinel lymph node mapping. Discussed seed use with lumpectomy. Neck step obtain MRI and then determine surgical approach. She has ample size breast so a large lumpectomy is possible as well. Consider radiation therapy and chemotherapy. Make referral to specialist once more information to obtain.The procedure has been discussed with the patient. Alternatives to surgery have been discussed with the patient. Risks of surgery include bleeding, Infection, Seroma formation, death, and the need for further surgery. Discussed lymphedema, shoulder stiffness, and complications of injury to structures in the axilla with some lymph node mapping. The patient understands and wishes to proceed.   No follow-ups on file.  Kennieth Francois, MD

## 2022-06-13 NOTE — Interval H&P Note (Signed)
History and Physical Interval Note:  06/13/2022 12:49 PM  Kara Harvey  has presented today for surgery, with the diagnosis of LEFT BREAST CANCER.  The various methods of treatment have been discussed with the patient and family. After consideration of risks, benefits and other options for treatment, the patient has consented to  Procedure(s): LEFT BREAST LUMPECTOMY WITH RADIOACTIVE SEED AND SENTINEL LYMPH NODE BIOPSY (Left) as a surgical intervention.  The patient's history has been reviewed, patient examined, no change in status, stable for surgery.  I have reviewed the patient's chart and labs.  Questions were answered to the patient's satisfaction.    The procedure has been discussed with the patient. Alternatives to surgery have been discussed with the patient.  Risks of surgery include bleeding,  Infection,  Seroma formation, death,  and the need for further surgery.   The patient understands and wishes to proceed.  Sentinel lymph node mapping and dissection has been discussed with the patient.  Risk of bleeding,  Infection,  Seroma formation,  Additional procedures,,  Shoulder weakness ,  Shoulder stiffness,  Nerve and blood vessel injury and reaction to the mapping dyes have been discussed.  Alternatives to surgery have been discussed with the patient.  The patient agrees to proceed.  [place]

## 2022-06-13 NOTE — Transfer of Care (Signed)
Immediate Anesthesia Transfer of Care Note  Patient: Kara Harvey  Procedure(s) Performed: LEFT BREAST LUMPECTOMY WITH RADIOACTIVE SEED AND SENTINEL LYMPH NODE BIOPSY (Left: Breast)  Patient Location: PACU  Anesthesia Type:General  Level of Consciousness: sedated  Airway & Oxygen Therapy: Patient connected to face mask oxygen  Post-op Assessment: Report given to RN  Post vital signs: Reviewed  Last Vitals:  Vitals Value Taken Time  BP    Temp    Pulse 91 06/13/22 1459  Resp    SpO2 74 % 06/13/22 1459  Vitals shown include unvalidated device data.  Last Pain:  Vitals:   06/13/22 1143  TempSrc: Oral  PainSc: 0-No pain         Complications: No notable events documented.

## 2022-06-13 NOTE — Anesthesia Procedure Notes (Signed)
Anesthesia Regional Block: Pectoralis block   Pre-Anesthetic Checklist: , timeout performed,  Correct Patient, Correct Site, Correct Laterality,  Correct Procedure, Correct Position, site marked,  Risks and benefits discussed,  Surgical consent,  Pre-op evaluation,  At surgeon's request and post-op pain management  Laterality: Upper and Left  Prep: chloraprep       Needles:  Injection technique: Single-shot  Needle Type: Echogenic Needle     Needle Length: 9cm  Needle Gauge: 21     Additional Needles:   Procedures:,,,, ultrasound used (permanent image in chart),,    Narrative:  Start time: 06/13/2022 12:16 PM End time: 06/13/2022 12:23 PM Injection made incrementally with aspirations every 5 mL.  Performed by: Personally  Anesthesiologist: Barnet Glasgow, MD  Additional Notes: Block assessed. Patient tolerated procedure well.

## 2022-06-13 NOTE — Discharge Instructions (Addendum)
[facility] Office Phone Number [phone]  BREAST BIOPSY/ PARTIAL MASTECTOMY: POST OP INSTRUCTIONS  Always review your discharge instruction sheet given to you by the facility where your surgery was performed.  IF YOU HAVE DISABILITY OR FAMILY LEAVE FORMS, YOU MUST BRING THEM TO THE OFFICE FOR PROCESSING.  DO NOT GIVE THEM TO YOUR DOCTOR.  A prescription for pain medication may be given to you upon discharge.  Take your pain medication as prescribed, if needed.  If narcotic pain medicine is not needed, then you may take acetaminophen (Tylenol) or ibuprofen (Advil) as needed. Take your usually prescribed medications unless otherwise directed If you need a refill on your pain medication, please contact your pharmacy.  They will contact our office to request authorization.  Prescriptions will not be filled after 5pm or on week-ends. You should eat very light the first 24 hours after surgery, such as soup, crackers, pudding, etc.  Resume your normal diet the day after surgery. Most patients will experience some swelling and bruising in the breast.  Ice packs and a good support bra will help.  Swelling and bruising can take several days to resolve.  It is common to experience some constipation if taking pain medication after surgery.  Increasing fluid intake and taking a stool softener will usually help or prevent this problem from occurring.  A mild laxative (Milk of Magnesia or Miralax) should be taken according to package directions if there are no bowel movements after 48 hours. Unless discharge instructions indicate otherwise, you may remove your bandages 24-48 hours after surgery, and you may shower at that time.  You may have steri-strips (small skin tapes) in place directly over the incision.  These strips should be left on the skin for 7-10 days.  If your surgeon used skin glue on the incision, you may shower in 24 hours.  The glue will flake off over the next 2-3 weeks.  Any  sutures or staples will be removed at the office during your follow-up visit. ACTIVITIES:  You may resume regular daily activities (gradually increasing) beginning the next day.  Wearing a good support bra or sports bra minimizes pain and swelling.  You may have sexual intercourse when it is comfortable. You may drive when you no longer are taking prescription pain medication, you can comfortably wear a seatbelt, and you can safely maneuver your car and apply brakes. RETURN TO WORK:  ______________________________________________________________________________________ You should see your doctor in the office for a follow-up appointment approximately two weeks after your surgery.  Your doctor's nurse will typically make your follow-up appointment when she calls you with your pathology report.  Expect your pathology report 2-3 business days after your surgery.  You may call to check if you do not hear from us after three days. OTHER INSTRUCTIONS: _______________________________________________________________________________________________ _____________________________________________________________________________________________________________________________________ _____________________________________________________________________________________________________________________________________ _____________________________________________________________________________________________________________________________________  WHEN TO CALL YOUR DOCTOR: Fever over 101.0 Nausea and/or vomiting. Extreme swelling or bruising. Continued bleeding from incision. Increased pain, redness, or drainage from the incision.  The clinic staff is available to answer your questions during regular business hours.  Please don't hesitate to call and ask to speak to one of the nurses for clinical concerns.  If you have a medical emergency, go to the nearest emergency room or call 911.  A surgeon from [facility] is always on call at the hospital.  For further questions, please visit [email or web address]    Post Anesthesia Home Care Instructions  Activity: Get plenty of rest for the remainder of   of the day. A responsible individual must stay with you for 24 hours following the procedure.  For the next 24 hours, DO NOT: -Drive a car -Paediatric nurse -Drink alcoholic beverages -Take any medication unless instructed by your physician -Make any legal decisions or sign important papers.  Meals: Start with liquid foods such as gelatin or soup. Progress to regular foods as tolerated. Avoid greasy, spicy, heavy foods. If nausea and/or vomiting occur, drink only clear liquids until the nausea and/or vomiting subsides. Call your physician if vomiting continues.  Special Instructions/Symptoms: Your throat may feel dry or sore from the anesthesia or the breathing tube placed in your throat during surgery. If this causes discomfort, gargle with warm salt water. The discomfort should disappear within 24 hours.  If you had a scopolamine patch placed behind your ear for the management of post- operative nausea and/or vomiting:  1. The medication in the patch is effective for 72 hours, after which it should be removed.  Wrap patch in a tissue and discard in the trash. Wash hands thoroughly with soap and water. 2. You may remove the patch earlier than 72 hours if you experience unpleasant side effects which may include dry mouth, dizziness or visual disturbances. 3. Avoid touching the patch. Wash your hands with soap and water after contact with the patch.    Information for Discharge Teaching: EXPAREL (bupivacaine liposome injectable suspension)   Your surgeon or anesthesiologist gave you EXPAREL(bupivacaine) to help control your pain after surgery.  EXPAREL is a local anesthetic that provides pain relief by numbing the tissue around the surgical site. EXPAREL is designed to release  pain medication over time and can control pain for up to 72 hours. Depending on how you respond to EXPAREL, you may require less pain medication during your recovery.  Possible side effects: Temporary loss of sensation or ability to move in the area where bupivacaine was injected. Nausea, vomiting, constipation Rarely, numbness and tingling in your mouth or lips, lightheadedness, or anxiety may occur. Call your doctor right away if you think you may be experiencing any of these sensations, or if you have other questions regarding possible side effects.  Follow all other discharge instructions given to you by your surgeon or nurse. Eat a healthy diet and drink plenty of water or other fluids.  If you return to the hospital for any reason within 96 hours following the administration of EXPAREL, it is important for health care providers to know that you have received this anesthetic. A teal colored band has been placed on your arm with the date, time and amount of EXPAREL you have received in order to alert and inform your health care providers. Please leave this armband in place for the full 96 hours following administration, and then you may remove the band.  May have Tylenol today at 7:30pm this evening 06/13/2022

## 2022-06-13 NOTE — Op Note (Signed)
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  It was of the lobular subtype therefore we recommend left breast lumpectomy with left axillary sentinel mapping.  We discussed breast conserving surgery and mastectomy reconstruction.  We discussed postoperative treatments of chemotherapy, radiation therapy and other treatments depending on final pathology.  She opted for breast conserving surgery.  We discussed use of seed and complications of surgery.The procedure has been discussed with the patient. Alternatives to surgery have been discussed with the patient.  Risks of surgery include bleeding,  Infection,  Seroma formation, death,  and the need for further surgery.   The patient understands and wishes to proceed.    Description of procedure: The patient was met in the holding area.  Questions were answered.  Of note a seed was placed as an outpatient.  Left pectoral block was administered by anesthesia.  Left side was marked.  She was then taken back to the operating room.  She was placed upon upon the OR table.  After induction of general anesthesia, left breast was prepped and draped in sterile fashion and a timeout was performed.  Proper patient, site and procedure were verified.  2 cc of mag tracer injected in the left subareolar plexus.  It was massaged for 5 minutes.  Second timeout was done to verify the  procedure.  She received appropriate preoperative antibiotics.  Neoprobe used identify the seed left breast upper outer quadrant.  A transverse incision was made over this.  Dissection was carried down all tissue around the seed and clip were excised with a grossly negative margin.  Hemostasis achieved.  Cavity was irrigated and clips were used to mark it.  Cavity is closed with 3-0 Vicryl.  4 Monocryl was used to close the skin in a subcuticular fashion.  The Faxitron image revealed the seed and clip be present and the tissue was sent to pathology after being oriented with ink.  Mag trace probe was then used.  Hotspot identified left axilla.  A transverse incision was made along the inferior border of the axilla.  This was about 4 cm.  Dissection was carried into the level 1 contents.  A single hot node was identified and removed.  Background counts approached baseline trough.  There were no other significant peaks of activity.  The long thoracic nerve, thoracodorsal trunk and extra vein were all preserved.  Small vessels were controlled with clips as well as small lymphatics.  Irrigation was used.  Hemostasis was achieved with cautery as well as Arista.  There is no signs of any ongoing bleeding.  The wound was then closed the deep layer 3-0 Vicryl and 4-0 Monocryl subcuticular stitch.  Dermabond applied.  All counts found to be correct.  Breast binder placed.  The patient was awoke extubated taken to recovery in satisfactory condition.

## 2022-06-14 ENCOUNTER — Encounter (HOSPITAL_BASED_OUTPATIENT_CLINIC_OR_DEPARTMENT_OTHER): Payer: Self-pay | Admitting: Surgery

## 2022-06-14 ENCOUNTER — Encounter (HOSPITAL_COMMUNITY): Payer: Self-pay

## 2022-06-14 DIAGNOSIS — C50412 Malignant neoplasm of upper-outer quadrant of left female breast: Secondary | ICD-10-CM | POA: Diagnosis not present

## 2022-06-16 NOTE — Progress Notes (Signed)
Patient Care Team: Crecencio Mc, MD as PCP - General (Internal Medicine) Mauro Kaufmann, RN as Oncology Nurse Navigator Rockwell Germany, RN as Oncology Nurse Navigator Nicholas Lose, MD as Consulting Physician (Hematology and Oncology)  DIAGNOSIS: No diagnosis found.  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  Second mass 1:00: Benign   05/11/2022 Cancer Staging   Staging form: Breast, AJCC 8th Edition - Clinical: Stage IA (cT1c, cN0, cM0, G2, ER+, PR-, HER2-) - Signed by Nicholas Lose, MD on 05/11/2022 Stage prefix: Initial diagnosis Histologic grading system: 3 grade system     CHIEF COMPLIANT: Follow-up after surgery  INTERVAL HISTORY: Kara Harvey is a 81 y.o. female is here because of recent diagnosis of left breast cancer.  Adjuvant radiation therapy followed by  Adjuvant antiestrogen therapy. She presents to the clinic for a follow-up.       ALLERGIES:  is allergic to sulfa antibiotics.  MEDICATIONS:  Current Outpatient Medications  Medication Sig Dispense Refill   ALPRAZolam (XANAX) 0.5 MG tablet Take 0.5 tablets (0.25 mg total) by mouth 2 (two) times daily as needed for anxiety. 30 tablet 5   amLODipine (NORVASC) 5 MG tablet Take 1.5 tablets (7.5 mg total) by mouth daily. 135 tablet 0   atorvastatin (LIPITOR) 20 MG tablet TAKE 1 TABLET(20 MG) BY MOUTH DAILY 90 tablet 3   buPROPion ER (WELLBUTRIN SR) 100 MG 12 hr tablet TAKE 1 TABLET(100 MG) BY MOUTH TWICE DAILY 60 tablet 5   calcium carbonate (TUMS -  DOSED IN MG ELEMENTAL CALCIUM) 500 MG chewable tablet Chew 1 tablet (200 mg of elemental calcium total) by mouth 3 (three) times daily as needed for indigestion or heartburn. 30 tablet 0   Cholecalciferol (VITAMIN D3) 1000 UNITS CAPS Take 1,000 Units by mouth daily.     famotidine (PEPCID) 20 MG tablet Take 20 mg by mouth 2 (two) times daily.     losartan (COZAAR) 100 MG tablet TAKE 1 TABLET(100 MG) BY MOUTH DAILY 90 tablet 1   ondansetron (ZOFRAN) 4 MG tablet Take 1 tablet (4 mg total) by mouth every 8 (eight) hours as needed for nausea or vomiting. 30 tablet 0   oxyCODONE (OXY IR/ROXICODONE) 5 MG immediate release tablet Take 1 tablet (5 mg total) by mouth every 6 (six) hours as needed for severe pain. 15 tablet 0   valACYclovir (VALTREX) 1000 MG tablet TAKE 2 TABLETS(2000 MG) BY MOUTH TWICE DAILY 20 tablet 0   Current Facility-Administered Medications  Medication Dose Route Frequency Provider Last Rate Last Admin   cyanocobalamin ((VITAMIN B-12)) injection 1,000 mcg  1,000 mcg Intramuscular Once Crecencio Mc, MD        PHYSICAL EXAMINATION: ECOG PERFORMANCE STATUS: {CHL ONC ECOG PS:[phone]}  There were no vitals filed for this visit. There were no vitals filed for this visit.  BREAST:*** No palpable masses or nodules in either right or left breasts. No palpable axillary supraclavicular or infraclavicular adenopathy no breast tenderness or nipple discharge. (exam performed in the presence of a chaperone)  LABORATORY DATA:  I have reviewed the data  as listed    Latest Ref Rng & Units 11/22/2021    2:23 PM 06/24/2020    4:37 PM 11/17/2019    8:49 AM  CMP  Glucose 70 - 99 mg/dL 91  98  101   BUN 6 - 23 mg/dL _0 Creatinine 0.40 - 1.20 mg/dL 0.77  0.81  0.74   Sodium 135 - 145 mEq/L 137  138  136   Potassium 3.5 - 5.1 mEq/L 4.0  4.3  4.0   Chloride 96 - 112 mEq/L 102  100  100   CO2 19 - 32 mEq/L _1 Calcium 8.4 - 10.5 mg/dL 9.4  9.7  9.5   Total Protein 6.0 -  8.3 g/dL 6.5  6.6  6.6   Total Bilirubin 0.2 - 1.2 mg/dL 0.5  0.4  0.6   Alkaline Phos 39 - 117 U/L 77   82   AST 0 - 37 U/L _2 ALT 0 - 35 U/L _3 Lab Results  Component Value Date   WBC 8.7 10/13/2019   HGB 14.1 10/13/2019   HCT 43.0 10/13/2019   MCV 88.1 10/13/2019   PLT 278 10/13/2019   NEUTROABS 5.1 10/13/2019    ASSESSMENT & PLAN:  No problem-specific Assessment & Plan notes found for this encounter.    No orders of the defined types were placed in this encounter.  The patient has a good understanding of the overall plan. she agrees with it. she will call with any problems that may develop before the next visit here. Total time spent: 30 mins including face to face time and time spent for planning, charting and co-ordination of care   Suzzette Righter, [place] 06/16/22    I Gardiner Coins am scribing for Dr. Lindi Adie  ***

## 2022-06-18 LAB — SURGICAL PATHOLOGY

## 2022-06-19 ENCOUNTER — Encounter: Payer: Self-pay | Admitting: *Deleted

## 2022-06-21 ENCOUNTER — Inpatient Hospital Stay: Payer: Medicare Other | Attending: Hematology and Oncology | Admitting: Hematology and Oncology

## 2022-06-21 ENCOUNTER — Other Ambulatory Visit: Payer: Self-pay

## 2022-06-21 VITALS — BP 150/75 | HR 92 | Temp 97.3°F | Resp 18 | Ht 62.0 in | Wt 178.4 lb

## 2022-06-21 DIAGNOSIS — Z79811 Long term (current) use of aromatase inhibitors: Secondary | ICD-10-CM | POA: Insufficient documentation

## 2022-06-21 DIAGNOSIS — Z79899 Other long term (current) drug therapy: Secondary | ICD-10-CM | POA: Insufficient documentation

## 2022-06-21 DIAGNOSIS — C50412 Malignant neoplasm of upper-outer quadrant of left female breast: Secondary | ICD-10-CM | POA: Diagnosis not present

## 2022-06-21 DIAGNOSIS — Z170 Estrogen receptor positive status [ER+]: Secondary | ICD-10-CM | POA: Insufficient documentation

## 2022-06-21 MED ORDER — ANASTROZOLE 1 MG PO TABS: 1.0000 mg | ORAL_TABLET | Freq: Every day | ORAL | 3 refills | Status: DC

## 2022-06-21 NOTE — Assessment & Plan Note (Addendum)
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 I discussed the margins as well as lymph node surgeries. We also discussed the final staging along with previously performed ER/PR and HER-2/neu testing.  Treatment plan: Adjuvant radiation therapy (patient does not want to do radiation) Followed by adjuvant antiestrogen therapy with anastrozole 1 mg daily started 06/21/2022  We did not perform Oncotype because of her age and lobular nature of the breast cancer. Return to clinic in 3 months for survivorship care plan visit

## 2022-06-22 ENCOUNTER — Encounter: Payer: Self-pay | Admitting: *Deleted

## 2022-06-22 DIAGNOSIS — Z170 Estrogen receptor positive status [ER+]: Secondary | ICD-10-CM

## 2022-07-03 NOTE — Therapy (Incomplete)
OUTPATIENT PHYSICAL THERAPY BREAST CANCER POST OP FOLLOW UP   Patient Name: Kara Harvey MRN: [id number] DOB:03-22-1941, 81 y.o., female Today's Date: 07/03/2022  END OF SESSION:   Past Medical History:  Diagnosis Date   Closed malleolar fracture, unspecified laterality, with routine healing, subsequent encounter 03/04/2014   occured in  July 2013   Fracture of finger, left, closed 04/28/2017   Humerus fracture 07/13/2017   Hyperlipidemia    Hypertension    Past Surgical History:  Procedure Laterality Date   BREAST BIOPSY Left 04/11/2022   x'2   BREAST BIOPSY  06/12/2022   MM LT RADIOACTIVE SEED LOC MAMMO GUIDE 06/12/2022 [facility] MAMMOGRAPHY   BREAST EXCISIONAL BIOPSY Left 1988   benign   BREAST LUMPECTOMY WITH RADIOACTIVE SEED AND SENTINEL LYMPH NODE BIOPSY Left 06/13/2022   Procedure: LEFT BREAST LUMPECTOMY WITH RADIOACTIVE SEED AND SENTINEL LYMPH NODE BIOPSY;  Surgeon: Erroll Luna, MD;  Location: [facility];  Service: General;  Laterality: Left;   [place]   for false positive/biopsy   EYE SURGERY Left 6wks ago   cataract   FRACTURE SURGERY Left 01/14/2011   ankle   TONSILECTOMY, ADENOIDECTOMY, BILATERAL MYRINGOTOMY AND TUBES Bilateral 1957   does not believe adenoids were removed   [place]   Patient Active Problem List   Diagnosis Date Noted   COVID-19 04/19/2022   Malignant neoplasm of upper-outer quadrant of left breast in female, estrogen receptor positive ([place]) 04/16/2022   Malignant neoplasm of upper-outer quadrant of left female breast ([place]) 04/13/2022   Encounter for preventive health examination 09/01/2020   Personal history of colonic polyps 06/24/2020   Anxiety as acute reaction to gross stress 12/20/2018   Vitamin D deficiency 04/29/2016   Osteopenia 04/26/2016   Long-term use of high-risk medication 01/25/2016   Depressive disorder 01/25/2016   Cataract extraction status of left eye 03/04/2014    Overweight (BMI 25.0-29.9) 09/05/2013   Urge incontinence 03/27/2013   Essential hypertension, benign 03/27/2013   Hyperlipidemia LDL goal <130 03/27/2013    PCP: Deborra Medina, MD  REFERRING PROVIDER: Nicholas Lose MD  REFERRING DIAG: Left Breast Cancer  THERAPY DIAG:  No diagnosis found.  Rationale for Evaluation and Treatment: Rehabilitation  ONSET DATE: 04/11/2022  SUBJECTIVE:                                                                                                                                                                                           SUBJECTIVE STATEMENT: ***  PERTINENT HISTORY:  Patient was diagnosed on 04/11/2022 with left grade 2 Invasive Lobular Carcinoma. It measures .85 cm and  is located in the left upper outer quadrant. It is ER +, PR negative, and Her 2 negative with a Ki67 of 15%. She is s/p a left lumpectomy with SLNB on 06/13/2022 . She is a primary caregiver for her husband since his CVA, and has a son with Schizo-affective disorder who lives with them. She lives in [place].  PATIENT GOALS:  Reassess how my recovery is going related to arm function, pain, and swelling.  PAIN:  Are you having pain? {OPRCPAIN:27236}  PRECAUTIONS: Recent Surgery, left UE Lymphedema risk, hypertension, anxiety, prior right proximal humerus fx  ACTIVITY LEVEL / LEISURE: photography   OBJECTIVE:   PATIENT SURVEYS:  QUICK DASH: ***  OBSERVATIONS: ***  POSTURE:  ***  LYMPHEDEMA ASSESSMENT:    UPPER EXTREMITY AROM/PROM:   A/PROM RIGHT   eval    Shoulder extension 58  Shoulder flexion 138  Shoulder abduction 135  Shoulder internal rotation    Shoulder external rotation                            (Blank rows = not tested)   A/PROM LEFT   eval  Shoulder extension 61  Shoulder flexion 155  Shoulder abduction 173  Shoulder internal rotation 73  Shoulder external rotation 90                          (Blank rows = not tested)    CERVICAL AROM: All within functional limits:    UPPER EXTREMITY STRENGTH: WFL   LYMPHEDEMA ASSESSMENTS:    LANDMARK RIGHT   eval  10 cm proximal to olecranon process 29.4  Olecranon process 25.4  10 cm proximal to ulnar styloid process 21.7  Just proximal to ulnar styloid process 15.9  Across hand at thumb web space 19.3  At base of 2nd digit 6.6  (Blank rows = not tested)   LANDMARK LEFT   eval  10 cm proximal to olecranon process 27.7  Olecranon process 24.3  10 cm proximal to ulnar styloid process 20.4  Just proximal to ulnar styloid process 15.8  Across hand at thumb web space 18.9  At base of 2nd digit 6.15  (Blank rows = not tested)      Surgery type/Date: Left lumpectomy with SLNB 06/13/2022 Number of lymph nodes removed: 0/4 Current/past treatment (chemo, radiation, hormone therapy): radiation recommended but declined Other symptoms:  Heaviness/tightness {yes/no:20286} Pain {yes/no:20286} Pitting edema {yes/no:20286} Infections {yes/no:20286} Decreased scar mobility {yes/no:20286} Stemmer sign {yes/no:20286}  PATIENT EDUCATION:  Education details: *** Person educated: {Person educated:25204} Education method: {Education Method:25205} Education comprehension: {Education Comprehension:25206}  HOME EXERCISE PROGRAM: Reviewed previously given post op HEP. ***  ASSESSMENT:  CLINICAL IMPRESSION: Pt is s/p left lumpectomy with SLNB with 0/4 LN's. She has declined having radiation.  Pt will benefit from skilled therapeutic intervention to improve on the following deficits: Decreased knowledge of precautions, impaired UE functional use, pain, decreased ROM, postural dysfunction.   PT treatment/interventions: ADL/Self care home management, {rehab planned interventions:25118::"Therapeutic exercises","Therapeutic activity","Neuromuscular re-education","Balance training","Gait training","Patient/Family education","Self Care","Joint  mobilization"}   GOALS: Goals reviewed with patient? Yes  LONG TERM GOALS:  (STG=LTG)  GOALS Name Target Date  Goal status  1 Pt will demonstrate she has regained full shoulder ROM and function post operatively compared to baselines.  Baseline: *** {GOALSTATUS:25110}  2  *** {GOALSTATUS:25110}  3  *** {GOALSTATUS:25110}  4  *** {GOALSTATUS:25110}     PLAN:  PT  FREQUENCY/DURATION: ***  PLAN FOR NEXT SESSION: ***   [facility]  [address], [address]  [place] [address]  [phone]  After Breast Cancer Class It is recommended you attend the ABC class to be educated on lymphedema risk reduction. This class is free of charge and lasts for 1 hour. It is a 1-time class. You will need to download the Webex app either on your phone or computer. We will send you a link the night before or the morning of the class. You should be able to click on that link to join the class. This is not a confidential class. You don't have to turn your camera on, but other participants may be able to see your email address.  Scar massage You can begin gentle scar massage to you incision sites. Gently place one hand on the incision and move the skin (without sliding on the skin) in various directions. Do this for a few minutes and then you can gently massage either coconut oil or vitamin E cream into the scars.  Compression garment You should continue wearing your compression bra until you feel like you no longer have swelling.  Home exercise Program Continue doing the exercises you were given until you feel like you can do them without feeling any tightness at the end.   Walking Program Studies show that 30 minutes of walking per day (fast enough to elevate your heart rate) can significantly reduce the risk of a cancer recurrence. If you can't walk due to other medical reasons, we encourage you to find another activity you could do (like a stationary bike or water  exercise).  Posture After breast cancer surgery, people frequently sit with rounded shoulders posture because it puts their incisions on slack and feels better. If you sit like this and scar tissue forms in that position, you can become very tight and have pain sitting or standing with good posture. Try to be aware of your posture and sit and stand up tall to heal properly.  Follow up PT: It is recommended you return every 3 months for the first 3 years following surgery to be assessed on the SOZO machine for an L-Dex score. This helps prevent clinically significant lymphedema in 95% of patients. These follow up screens are 10 minute appointments that you are not billed for.  Claris Pong, PT 07/03/2022, 9:48 AM

## 2022-07-04 ENCOUNTER — Encounter: Payer: Self-pay | Admitting: Physical Therapy

## 2022-07-04 ENCOUNTER — Ambulatory Visit: Payer: Medicare Other | Attending: Hematology and Oncology | Admitting: Physical Therapy

## 2022-07-04 ENCOUNTER — Other Ambulatory Visit: Payer: Self-pay

## 2022-07-04 DIAGNOSIS — C50412 Malignant neoplasm of upper-outer quadrant of left female breast: Secondary | ICD-10-CM | POA: Insufficient documentation

## 2022-07-04 DIAGNOSIS — Z483 Aftercare following surgery for neoplasm: Secondary | ICD-10-CM | POA: Diagnosis not present

## 2022-07-04 DIAGNOSIS — R293 Abnormal posture: Secondary | ICD-10-CM | POA: Diagnosis not present

## 2022-07-04 DIAGNOSIS — Z170 Estrogen receptor positive status [ER+]: Secondary | ICD-10-CM | POA: Diagnosis not present

## 2022-07-04 NOTE — Patient Instructions (Addendum)
After Breast Cancer Class It is recommended you attend the ABC class to be educated on lymphedema risk reduction. This class is free of charge and lasts for 1 hour. It is a 1-time class. You will need to download the Webex app either on your phone or computer. We will send you a link the night before or the morning of the class. You should be able to click on that link to join the class. This is not a confidential class. You don't have to turn your camera on, but other participants may be able to see your email address.  Scar massage You can begin gentle scar massage to you incision sites. Gently place one hand on the incision and move the skin (without sliding on the skin) in various directions. Do this for a few minutes and then you can gently massage either coconut oil or vitamin E cream into the scars ONCE THE GLUE IS GONE.  Compression garment You should continue wearing your compression bra until you feel like you no longer have swelling.  Home exercise Program Continue doing the exercises you were given until you feel like you can do them without feeling any tightness at the end.  Do the first and last ones on the purple paper laying flat on your back.  Walking Program Studies show that 30 minutes of walking per day (fast enough to elevate your heart rate) can significantly reduce the risk of a cancer recurrence. If you can't walk due to other medical reasons, we encourage you to find another activity you could do (like a stationary bike or water exercise).  Posture After breast cancer surgery, people frequently sit with rounded shoulders posture because it puts their incisions on slack and feels better. If you sit like this and scar tissue forms in that position, you can become very tight and have pain sitting or standing with good posture. Try to be aware of your posture and sit and stand up tall to heal properly.  Follow up PT: It is recommended you return every 3 months for the first 3  years following surgery to be assessed on the SOZO machine for an L-Dex score. This helps prevent clinically significant lymphedema in 95% of patients. These follow up screens are 10 minute appointments that you are not billed for. You are scheduled for Feb. 26th at 3:00.   Closed Chain: Shoulder Abduction / Adduction - on Wall    One hand on wall, step to side and return. Stepping causes shoulder to abduct and adduct. Step _5__ times, holding 5 seconds, __2_ times per day.  http://ss.exer.us/267   Copyright  VHI. All rights reserved.  Closed Chain: Shoulder Flexion / Extension - on Wall    Hands on wall, step backward. Return. Stepping causes shoulder flexion and extension Step _5__ times, holding 5 seconds, __2_ times per day.  http://ss.exer.us/265   Copyright  VHI. All rights reserved.

## 2022-08-03 DIAGNOSIS — H01116 Allergic dermatitis of left eye, unspecified eyelid: Secondary | ICD-10-CM | POA: Diagnosis not present

## 2022-08-13 ENCOUNTER — Ambulatory Visit: Payer: Medicare Other | Admitting: Physical Therapy

## 2022-08-13 ENCOUNTER — Telehealth: Payer: Self-pay | Admitting: Physical Therapy

## 2022-08-13 NOTE — Therapy (Deleted)
OUTPATIENT PHYSICAL THERAPY BREAST CANCER POST OP FOLLOW UP   Patient Name: Kara Harvey MRN: [id number] DOB:1941-05-10, 82 y.o., female Today's Date: 08/13/2022  END OF SESSION:    Past Medical History:  Diagnosis Date   Closed malleolar fracture, unspecified laterality, with routine healing, subsequent encounter 03/04/2014   occured in  July 2013   Fracture of finger, left, closed 04/28/2017   Humerus fracture 07/13/2017   Hyperlipidemia    Hypertension    Past Surgical History:  Procedure Laterality Date   BREAST BIOPSY Left 04/11/2022   x'2   BREAST BIOPSY  06/12/2022   MM LT RADIOACTIVE SEED LOC MAMMO GUIDE 06/12/2022 [facility] MAMMOGRAP[facility]   BREAST EXCISIONAL BIOPSY Left 1988   benign   BREAST LUMPECTOMY WITH RADIOACTIVE SEED AND SENTINEL LYMPH NODE BIOPSY Left 06/13/2022   Procedure: LEFT BREAST LUMPECTOMY WITH RADIOACTIVE SEED AND SENTINEL LYMPH NODE BIOPSY;  Surgeon: Erroll Luna, MD;  Location: [facility];  Service: General;  Laterality: Left;   BREAST SURGERY Left 1986   for false positive/biopsy   EYE SURGERY Left 6wks ago   cataract   FRACTURE SURGERY Left 01/14/2011   ankle   TONSILECTOMY, ADENOIDECTOMY, BILATERAL MYRINGOTOMY AND TUBES Bilateral 1957   does not believe adenoids were removed   [place]   Patient Active Problem List   Diagnosis Date Noted   COVID-19 04/19/2022   Malignant neoplasm of upper-outer quadrant of left breast in female, estrogen receptor positive ([place]) 04/16/2022   Malignant neoplasm of upper-outer quadrant of left female breast ([place]) 04/13/2022   Encounter for preventive health examination 09/01/2020   Personal history of colonic polyps 06/24/2020   Anxiety as acute reaction to gross stress 12/20/2018   Vitamin D deficiency 04/29/2016   Osteopenia 04/26/2016   Long-term use of high-risk medication 01/25/2016   Depressive disorder 01/25/2016   Cataract extraction status of left eye 03/04/2014    Overweight (BMI 25.0-29.9) 09/05/2013   Urge incontinence 03/27/2013   Essential hypertension, benign 03/27/2013   Hyperlipidemia LDL goal <130 03/27/2013    REFERRING PROVIDER: Dr. Nicholas Lose  REFERRING DIAG: Left breast cancer  THERAPY DIAG:  Malignant neoplasm of upper-outer quadrant of left breast in female, estrogen receptor positive ([place])  Abnormal posture  Aftercare following surgery for neoplasm  Rationale for Evaluation and Treatment: Rehabilitation  ONSET DATE: 06/13/2022  SUBJECTIVE:                                                                                                                                                                                           SUBJECTIVE STATEMENT: Patient reports she underwent a left  lumpectomy and sentinel node biopsy (0/4 positive) on 06/13/2022. She declined radiation and doing anti-estrogen therapy.  PERTINENT HISTORY:  Patient was diagnosed on 04/11/2022 with left grade 2 Invasive Lobular Carcinoma. She had a left lumpectomy and sentinel node biopsy (0/4 positive) on 06/13/2022. It is ER +, PR negative, and Her 2 negative with a Ki67 of 15%. She is scheduled for left lumpectomy with SLNB on 06/13/2022   PATIENT GOALS:  Reassess how my recovery is going related to arm function, pain, and swelling.  PAIN:  Are you having pain? Yes: NPRS scale: 1 or less/10 Pain location: left axillary incision Pain description: soreness Aggravating factors: unknown Relieving factors: unknown  PRECAUTIONS: Recent Surgery, left UE Lymphedema risk, Other: hypertension, anxiety , prior right proximal humerus fx  ACTIVITY LEVEL / LEISURE: She does no exercise   OBJECTIVE:   PATIENT SURVEYS:  QUICK DASH:     OBSERVATIONS: Left breast and axillary incisions both are healing well with some glue still present. Seroma present just superior to her breast incision. Scar tissue present under left axillary incision.  POSTURE:  Forward  head and rounded shoulders posture  LYMPHEDEMA ASSESSMENT:   UPPER EXTREMITY AROM/PROM:   A/PROM RIGHT   eval    Shoulder extension 58  Shoulder flexion 138  Shoulder abduction 135  Shoulder internal rotation    Shoulder external rotation                            (Blank rows = not tested)   A/PROM LEFT   eval LEFT 07/04/2022 LEFT 08/13/2022  Shoulder extension 61 54   Shoulder flexion 155 119   Shoulder abduction 173 128   Shoulder internal rotation 73 62   Shoulder external rotation 90 73                           (Blank rows = not tested)   CERVICAL AROM: All within functional limits:    UPPER EXTREMITY STRENGTH: WFL   LYMPHEDEMA ASSESSMENTS:    LANDMARK RIGHT   eval RIGHT 07/04/2022  10 cm proximal to olecranon process 29.4 27.9  Olecranon process 25.4 25.4  10 cm proximal to ulnar styloid process 21.7 21.6  Just proximal to ulnar styloid process 15.9 25.3  Across hand at thumb web space 19.3 18.8  At base of 2nd digit 6.6 6.6  (Blank rows = not tested)   LANDMARK LEFT   eval LEFT 07/04/2022  10 cm proximal to olecranon process 27.7 26.9  Olecranon process 24.3 24.5  10 cm proximal to ulnar styloid process 20.4 20.7  Just proximal to ulnar styloid process 15.8 15.3  Across hand at thumb web space 18.9 18  At base of 2nd digit 6.15 6.3  (Blank rows = not tested)  Surgery type/Date: Left lumpectomy and sentinel node biopsy 06/13/2022 Number of lymph nodes removed: 4 Current/past treatment (chemo, radiation, hormone therapy): none Other symptoms:  Heaviness/tightness No Pain No Pitting edema No Infections No Decreased scar mobility Yes Stemmer sign No  PATIENT EDUCATION:  Education details: HEP and lymphedema education Person educated: Patient and Child(ren) Education method: Explanation, Demonstration, and Handouts Education comprehension: verbalized understanding and returned demonstration  HOME EXERCISE PROGRAM: Reviewed previously given  post op HEP.  ASSESSMENT:  CLINICAL IMPRESSION: Patient is doing well s/p left lumpectomy and sentinel node biopsy on 06/13/2022. She has what appears to be a seroma present just superior to  her breast incision and has scar tissue present with glue still on incision sites. Shoulder ROM is limited but pt lives [place] away and requests to try her new HEP for 1 month and then return to determine if she needs PT. This is a reasonable plan and we will begin PT if needed after her next reassessment.  Pt will benefit from skilled therapeutic intervention to improve on the following deficits: Decreased knowledge of precautions, impaired UE functional use, pain, decreased ROM, postural dysfunction.   PT treatment/interventions: ADL/Self care home management, Therapeutic exercises, Therapeutic activity, Neuromuscular re-education, Balance training, Gait training, Patient/Family education, Self Care, and Joint mobilization   GOALS: Goals reviewed with patient? Yes  LONG TERM GOALS:  (STG=LTG)  GOALS Name Target Date  Goal status  1 Pt will demonstrate she has regained full shoulder ROM and function post operatively compared to baselines.  Baseline: 07/16/2022 IN PROGRESS     PLAN:  PT FREQUENCY/DURATION: 1 follow up visit  PLAN FOR NEXT SESSION: recheck shoulder ROM and determine   Annia Friendly, PT 08/13/22 10:01 AM t

## 2022-08-13 NOTE — Telephone Encounter (Signed)
Spoke with pt. She was originally scheduled to come to PT today since she had limitations at her post op appt. She stated that she has regained full shoulder ROM and feels "like nothing ever happened to her." She requests to cancel her appt. She reports her seroma persists but that it is not bothering her and has reduced some. She knows to call us if she has concerns and will return for her 3 month SOZO screen. Annia Friendly, [place] 08/13/22 10:34 AM

## 2022-09-03 ENCOUNTER — Other Ambulatory Visit: Payer: Self-pay | Admitting: Internal Medicine

## 2022-09-10 ENCOUNTER — Ambulatory Visit: Payer: Medicare Other | Attending: Hematology and Oncology

## 2022-09-10 VITALS — Wt 173.2 lb

## 2022-09-10 DIAGNOSIS — Z483 Aftercare following surgery for neoplasm: Secondary | ICD-10-CM | POA: Insufficient documentation

## 2022-09-10 NOTE — Therapy (Signed)
OUTPATIENT PHYSICAL THERAPY SOZO SCREENING NOTE   Patient Name: Kara Harvey MRN: [id number] DOB:[birth year], 82 y.o., female Today's Date: 09/10/2022  PCP: Crecencio Mc, MD REFERRING PROVIDER: Nicholas Lose, MD   PT End of Session - 09/10/22 1507     Visit Number 2   # unchanged due to screen only   PT Start Time 1506    PT Stop Time 1510    PT Time Calculation (min) 4 min    Activity Tolerance Patient tolerated treatment well    Behavior During Therapy [facility] for tasks assessed/performed             Past Medical History:  Diagnosis Date   Closed malleolar fracture, unspecified laterality, with routine healing, subsequent encounter 03/04/2014   occured in  July 2013   Fracture of finger, left, closed 04/28/2017   Humerus fracture 07/13/2017   Hyperlipidemia    Hypertension    Past Surgical History:  Procedure Laterality Date   BREAST BIOPSY Left 04/11/2022   x'2   BREAST BIOPSY  06/12/2022   MM LT RADIOACTIVE SEED LOC MAMMO GUIDE 06/12/2022 [facility] MAMMO[facility]AP[facility]   BREAST EXCISIONAL BIOPSY Left 1988   benign   BREAST LUMPECTOMY WITH RADIOACTIVE SEED AND SENTINEL LYMPH NODE BIOPSY Left 06/13/2022   Procedure: LEFT BREAST LUMPECTOMY WITH RADIOACTIVE SEED AND SENTINEL LYMPH NODE BIOPSY;  Surgeon: Erroll Luna, MD;  Location: [facility];  Service: General;  Laterality: Left;   [place]   for false positive/biopsy   EYE SURGERY Left 6wks ago   cataract   FRACTURE SURGERY Left 01/14/2011   ankle   TONSILECTOMY, ADENOIDECTOMY, BILATERAL MYRINGOTOMY AND TUBES Bilateral 1957   does not believe adenoids were removed   [place]   Patient Active Problem List   Diagnosis Date Noted   COVID-19 04/19/2022   Malignant neoplasm of upper-outer quadrant of left breast in female, estrogen receptor positive ([place]) 04/16/2022   Malignant neoplasm of upper-outer quadrant of left female breast ([place]) 04/13/2022   Encounter for preventive  health examination 09/01/2020   Personal history of colonic polyps 06/24/2020   Anxiety as acute reaction to gross stress 12/20/2018   Vitamin D deficiency 04/29/2016   Osteopenia 04/26/2016   Long-term use of high-risk medication 01/25/2016   Depressive disorder 01/25/2016   Cataract extraction status of left eye 03/04/2014   Overweight (BMI 25.0-29.9) 09/05/2013   Urge incontinence 03/27/2013   Essential hypertension, benign 03/27/2013   Hyperlipidemia LDL goal <130 03/27/2013    REFERRING DIAG: left breast cancer at risk for lymphedema  THERAPY DIAG:  Aftercare following surgery for neoplasm  PERTINENT HISTORY: Patient was diagnosed on 04/11/2022 with left grade 2 Invasive Lobular Carcinoma. She had a left lumpectomy and sentinel node biopsy (0/4 positive) on 06/13/2022. It is ER +, PR negative, and Her 2 negative with a Ki67 of 15%. She is scheduled for left lumpectomy with SLNB on 06/13/2022   PRECAUTIONS: left UE Lymphedema risk, None  SUBJECTIVE: Pt returns for her 3 month L-Dex screen.   PAIN:  Are you having pain? No  SOZO SCREENING: Patient was assessed today using the SOZO machine to determine the lymphedema index score. This was compared to her baseline score. It was determined that she is within the recommended range when compared to her baseline and no further action is needed at this time. She will continue SOZO screenings. These are done every 3 months for 2 years post operatively followed by every 6 months  for 2 years, and then annually.   L-DEX FLOWSHEETS - 09/10/22 1500       L-DEX LYMPHEDEMA SCREENING   Measurement Type Unilateral    L-DEX MEASUREMENT EXTREMITY Upper Extremity    POSITION  Standing    DOMINANT SIDE Right    At Risk Side Left    BASELINE SCORE (UNILATERAL) -0.4    L-DEX SCORE (UNILATERAL) -0.4    VALUE CHANGE (UNILAT) 0               Otelia Limes, PTA 09/10/2022, 3:10 PM

## 2022-09-17 ENCOUNTER — Inpatient Hospital Stay: Payer: Medicare Other | Attending: Hematology and Oncology | Admitting: Adult Health

## 2022-09-17 ENCOUNTER — Encounter: Payer: Self-pay | Admitting: Adult Health

## 2022-09-17 ENCOUNTER — Other Ambulatory Visit: Payer: Self-pay

## 2022-09-17 VITALS — BP 148/67 | HR 87 | Temp 97.9°F | Resp 18 | Ht 62.0 in | Wt 173.2 lb

## 2022-09-17 DIAGNOSIS — Z170 Estrogen receptor positive status [ER+]: Secondary | ICD-10-CM | POA: Diagnosis not present

## 2022-09-17 DIAGNOSIS — C50412 Malignant neoplasm of upper-outer quadrant of left female breast: Secondary | ICD-10-CM | POA: Diagnosis not present

## 2022-09-17 DIAGNOSIS — Z79811 Long term (current) use of aromatase inhibitors: Secondary | ICD-10-CM | POA: Diagnosis not present

## 2022-09-17 NOTE — Progress Notes (Signed)
SURVIVORSHIP VISIT:    BRIEF ONCOLOGIC HISTORY:  Oncology History  Malignant neoplasm of upper-outer quadrant of left breast in female, estrogen receptor positive ([place])  04/11/2022 Initial Diagnosis   Remote history left lumpectomy, screening mammogram detected left breast distortion by ultrasound measured 1.7 cm 11:30 position and irregular hypoechoic mass 1.4 cm, axilla negative, biopsy revealed grade 2 invasive lobular cancer at the 11:30 position ER 60%, PR 0%, Ki-67 15%, HER2 2+ by IHC, FISH negative ratio 1.55.  Second mass 1:00: Benign   05/11/2022 Cancer Staging   Staging form: Breast, AJCC 8th Edition - Clinical: Stage IA (cT1c, cN0, cM0, G2, ER+, PR-, HER2-) - Signed by Nicholas Lose, MD on 05/11/2022 Stage prefix: Initial diagnosis Histologic grading system: 3 grade system   06/2022 -  Anti-estrogen oral therapy   1 mg Anastrozole     INTERVAL HISTORY:  Kara Harvey to review her survivorship care plan detailing her treatment course for breast cancer, as well as monitoring long-term side effects of that treatment, education regarding health maintenance, screening, and overall wellness and health promotion.     Overall, Kara Harvey reports feeling quite well.  She is taking Anastrozole daily.  She denies significant hot flashes and is tolerating it well.    REVIEW OF SYSTEMS:  Review of Systems  Constitutional:  Negative for appetite change, chills, fatigue, fever and unexpected weight change.  HENT:   Negative for hearing loss, lump/mass and trouble swallowing.   Eyes:  Negative for eye problems and icterus.  Respiratory:  Negative for chest tightness, cough and shortness of breath.   Cardiovascular:  Negative for chest pain, leg swelling and palpitations.  Gastrointestinal:  Negative for abdominal distention, abdominal pain, constipation, diarrhea, nausea and vomiting.  Endocrine: Negative for hot flashes.  Genitourinary:  Negative for difficulty urinating.    Musculoskeletal:  Negative for arthralgias.  Skin:  Negative for itching and rash.  Neurological:  Negative for dizziness, extremity weakness, headaches and numbness.  Hematological:  Negative for adenopathy. Does not bruise/bleed easily.  Psychiatric/Behavioral:  Negative for depression. The patient is not nervous/anxious.    Breast: Denies any new nodularity, masses, tenderness, nipple changes, or nipple discharge.        PAST MEDICAL/SURGICAL HISTORY:  Past Medical History:  Diagnosis Date   Closed malleolar fracture, unspecified laterality, with routine healing, subsequent encounter 03/04/2014   occured in  July 2013   Fracture of finger, left, closed 04/28/2017   Humerus fracture 07/13/2017   Hyperlipidemia    Hypertension    Past Surgical History:  Procedure Laterality Date   BREAST BIOPSY Left 04/11/2022   x'2   BREAST BIOPSY  06/12/2022   MM LT RADIOACTIVE SEED LOC MAMMO GUIDE 06/12/2022 [facility]   BREAST EXCISIONAL BIOPSY Left 1988   benign   BREAST LUMPECTOMY WITH RADIOACTIVE SEED AND SENTINEL LYMPH NODE BIOPSY Left 06/13/2022   Procedure: LEFT BREAST LUMPECTOMY WITH RADIOACTIVE SEED AND SENTINEL LYMPH NODE BIOPSY;  Surgeon: Erroll Luna, MD;  Location: [facility];  Service: General;  Laterality: Left;   BREAST SURGERY Left 1986   for false positive/biopsy   EYE SURGERY Left 6wks ago   cataract   FRACTURE SURGERY Left 01/14/2011   ankle   TONSILECTOMY, ADENOIDECTOMY, BILATERAL MYRINGOTOMY AND TUBES Bilateral 1957   does not believe adenoids were removed   TUBAL LIGATION  1985     ALLERGIES:  Allergies  Allergen Reactions   Sulfa Antibiotics Rash     CURRENT MEDICATIONS:  Outpatient Encounter  Medications as of 09/17/2022  Medication Sig   ALPRAZolam (XANAX) 0.5 MG tablet Take 0.5 tablets (0.25 mg total) by mouth 2 (two) times daily as needed for anxiety.   amLODipine (NORVASC) 5 MG tablet TAKE 1 AND 1/2 TABLETS(7.5 MG) BY  MOUTH DAILY   anastrozole (ARIMIDEX) 1 MG tablet Take 1 tablet (1 mg total) by mouth daily.   atorvastatin (LIPITOR) 20 MG tablet TAKE 1 TABLET(20 MG) BY MOUTH DAILY   buPROPion ER (WELLBUTRIN SR) 100 MG 12 hr tablet TAKE 1 TABLET(100 MG) BY MOUTH TWICE DAILY   calcium carbonate (TUMS - DOSED IN MG ELEMENTAL CALCIUM) 500 MG chewable tablet Chew 1 tablet (200 mg of elemental calcium total) by mouth 3 (three) times daily as needed for indigestion or heartburn.   Cholecalciferol (VITAMIN D3) 1000 UNITS CAPS Take 1,000 Units by mouth daily.   famotidine (PEPCID) 20 MG tablet Take 20 mg by mouth 2 (two) times daily.   losartan (COZAAR) 100 MG tablet TAKE 1 TABLET(100 MG) BY MOUTH DAILY   ondansetron (ZOFRAN) 4 MG tablet Take 1 tablet (4 mg total) by mouth every 8 (eight) hours as needed for nausea or vomiting.   oxyCODONE (OXY IR/ROXICODONE) 5 MG immediate release tablet Take 1 tablet (5 mg total) by mouth every 6 (six) hours as needed for severe pain.   valACYclovir (VALTREX) 1000 MG tablet TAKE 2 TABLETS(2000 MG) BY MOUTH TWICE DAILY   Facility-Administered Encounter Medications as of 09/17/2022  Medication Note   cyanocobalamin ((VITAMIN B-12)) injection 1,000 mcg 06/05/2022: Not taking per pt      ONCOLOGIC FAMILY HISTORY:  Family History  Problem Relation Age of Onset   Cancer Mother        lung Cancer   Hyperlipidemia Mother    Arthritis Father    Alzheimer's disease Father    Colon cancer Paternal Aunt      SOCIAL HISTORY:  Social History   Socioeconomic History   Marital status: Married    Spouse name: Not on file   Number of children: Not on file   Years of education: Not on file   Highest education level: Not on file  Occupational History   Not on file  Tobacco Use   Smoking status: Former    Packs/day: 2.00    Types: Cigarettes    Quit date: 03/28/1979    Years since quitting: 43.5   Smokeless tobacco: Never  Vaping Use   Vaping Use: Never used  Substance and  Sexual Activity   Alcohol use: Yes    Alcohol/week: 5.0 standard drinks of alcohol    Types: 5 Glasses of wine per week   Drug use: No   Sexual activity: Yes  Other Topics Concern   Not on file  Social History Narrative   Not on file   Social Determinants of Health   Financial Resource Strain: Low Risk  (10/23/2021)   Overall Financial Resource Strain (CARDIA)    Difficulty of Paying Living Expenses: Not hard at all  Food Insecurity: No Food Insecurity (10/23/2021)   Hunger Vital Sign    Worried About Running Out of Food in the Last Year: Never true    Ran Out of Food in the Last Year: Never true  Transportation Needs: No Transportation Needs (10/23/2021)   PRAPARE - Hydrologist (Medical): No    Lack of Transportation (Non-Medical): No  Physical Activity: Not on file  Stress: No Stress Concern Present (10/23/2021)   [employer]  of Occupational [employer] - Occupational Stress Questionnaire    Feeling of Stress : Not at all  Social Connections: Unknown (10/23/2021)   Social Connection and Isolation Panel [NHANES]    Frequency of Communication with Friends and Family: More than three times a week    Frequency of Social Gatherings with Friends and Family: Once a week    Attends Religious Services: Not on Advertising copywriter or Organizations: Not on file    Attends Archivist Meetings: Not on file    Marital Status: Married  Intimate Partner Violence: Not At Risk (10/23/2021)   Humiliation, Afraid, Rape, and Kick questionnaire    Fear of Current or Ex-Partner: No    Emotionally Abused: No    Physically Abused: No    Sexually Abused: No     OBSERVATIONS/OBJECTIVE:  BP (!) 148/67 (BP Location: Right Arm, Patient Position: Sitting)   Pulse 87   Temp 97.9 F (36.6 C) (Tympanic)   Resp 18   Ht '5\' 2"'$  (1.575 m)   Wt 173 lb 3.2 oz (78.6 kg)   SpO2 97%   BMI 31.68 kg/m  GENERAL: Patient is a well appearing female in no acute  distress HEENT:  Sclerae anicteric.  Oropharynx clear and moist. No ulcerations or evidence of oropharyngeal candidiasis. Neck is supple.  NODES:  No cervical, supraclavicular, or axillary lymphadenopathy palpated.  BREAST EXAM: Left breast status postlumpectomy no sign of local recurrence right breast is benign. LUNGS:  Clear to auscultation bilaterally.  No wheezes or rhonchi. HEART:  Regular rate and rhythm. No murmur appreciated. ABDOMEN:  Soft, nontender.  Positive, normoactive bowel sounds. No organomegaly palpated. MSK:  No focal spinal tenderness to palpation. Full range of motion bilaterally in the upper extremities. EXTREMITIES:  No peripheral edema.   SKIN:  Clear with no obvious rashes or skin changes. No nail dyscrasia. NEURO:  Nonfocal. Well oriented.  Appropriate affect.  LABORATORY DATA:  None for this visit.  DIAGNOSTIC IMAGING:  None for this visit.      ASSESSMENT AND PLAN:  Ms.. Kara Harvey is a pleasant 82 y.o. female with Stage 1A left breast invasive ductal carcinoma, ER+/PR+/HER2-, diagnosed in 03/2022, treated with lumpectomy and anti-estrogen therapy with anastrozole beginning in December 2023.  She presents to the Survivorship Clinic for our initial meeting and routine follow-up post-completion of treatment for breast cancer.    1. Stage 1A left breast cancer:  Kara Harvey is continuing to recover from definitive treatment for breast cancer. She will follow-up with her medical oncologist, Dr. Lindi Adie in 6 months with history and physical exam per surveillance protocol.  She will continue her anti-estrogen therapy with anastrozole. Thus far, she is tolerating the anastrozole well, with minimal side effects. She was instructed to make Dr. Lindi Adie or myself aware if she begins to experience any worsening side effects of the medication and I could see her back in clinic to help manage those side effects, as needed. Her mammogram is due 03/2023; orders placed today.   Today,  a comprehensive survivorship care plan and treatment summary was reviewed with the patient today detailing her breast cancer diagnosis, treatment course, potential late/long-term effects of treatment, appropriate follow-up care with recommendations for the future, and patient education resources.  A copy of this summary, along with a letter will be sent to the patient's primary care provider via mail/fax/In Basket message after today's visit.    2. Bone health:  Given Kara Harvey's age/history of breast  cancer and her current treatment regimen including anti-estrogen therapy with anastrozole, she is at risk for bone demineralization.  Her last DEXA scan was completed on May 30, 2022 demonstrating a T-score of -2.3 in the left femur consistent with osteopenia.  Repeat bone density testing is recommended in November 2025.  She was given education on specific activities to promote bone health.  3. Cancer screening:  Due to Kara Harvey's history and her age, she should receive screening for skin cancers.  The information and recommendations are listed on the patient's comprehensive care plan/treatment summary and were reviewed in detail with the patient.    4. Health maintenance and wellness promotion: Kara Harvey was encouraged to consume 5-7 servings of fruits and vegetables per day. We reviewed the "Nutrition Rainbow" handout.  She was also encouraged to engage in moderate to vigorous exercise for 30 minutes per day most days of the week. She was instructed to limit her alcohol consumption and continue to abstain from tobacco use.   5. Support services/counseling: It is not uncommon for this period of the patient's cancer care trajectory to be one of many emotions and stressors. She was given information regarding our available services and encouraged to contact me with any questions or for help enrolling in any of our support group/programs.    Follow up instructions:    -Return to cancer center  in 6 months for f/u with Dr. Lindi Adie  -Mammogram due in 03/2023 -Bone density due in 05/2024 -She is welcome to return back to the Survivorship Clinic at any time; no additional follow-up needed at this time.  -Consider referral back to survivorship as a long-term survivor for continued surveillance  The patient was provided an opportunity to ask questions and all were answered. The patient agreed with the plan and demonstrated an understanding of the instructions.   Total encounter time:30 minutes*in face-to-face visit time, chart review, lab review, care coordination, order entry, and documentation of the encounter time.    Wilber Bihari, NP 09/17/22 3:51 PM Medical Oncology and Hematology [facility] [place], [place] [address] Tel. [phone]    Fax. [phone]  *Total Encounter Time as defined by the Centers for Medicare and Medicaid Services includes, in addition to the face-to-face time of a patient visit (documented in the note above) non-face-to-face time: obtaining and reviewing outside history, ordering and reviewing medications, tests or procedures, care coordination (communications with other health care professionals or caregivers) and documentation in the medical record.

## 2022-10-08 ENCOUNTER — Telehealth: Payer: Self-pay | Admitting: Internal Medicine

## 2022-10-08 NOTE — Telephone Encounter (Signed)
LMTCB. Pt will need to schedule an virtual visit.

## 2022-10-08 NOTE — Telephone Encounter (Signed)
Pt called in staying that she tested positive for Covid today, Saturday pt felt something was coming because she has a little congestion, Sunday she fell very tired and no energy. And she was wondering if Dr. Derrel Nip can sent any antiviral meds to her pharmacy?

## 2022-10-09 ENCOUNTER — Telehealth (INDEPENDENT_AMBULATORY_CARE_PROVIDER_SITE_OTHER): Payer: Medicare Other | Admitting: Family Medicine

## 2022-10-09 DIAGNOSIS — U071 COVID-19: Secondary | ICD-10-CM

## 2022-10-09 MED ORDER — MOLNUPIRAVIR EUA 200MG CAPSULE: 4.0000 | ORAL_CAPSULE | Freq: Two times a day (BID) | ORAL | 0 refills | Status: AC

## 2022-10-09 NOTE — Telephone Encounter (Signed)
Per Kara Harvey pt has a virtual visit scheduled for today with Colin Benton.

## 2022-10-09 NOTE — Telephone Encounter (Signed)
LMTCB

## 2022-10-09 NOTE — Patient Instructions (Addendum)
HOME CARE TIPS:   -I sent the medication(s) we discussed to your pharmacy: Meds ordered this encounter  Medications   molnupiravir EUA (LAGEVRIO) 200 mg CAPS capsule    Sig: Take 4 capsules (800 mg total) by mouth 2 (two) times daily for 5 days.    Dispense:  40 capsule    Refill:  0     -I sent in the Covid19 treatment or referral you requested per our discussion. Please see the information provided below and discuss further with the pharmacist/treatment team.    -there is a chance of rebound illness with covid after improving. This can happen whether or not you take an antiviral treatment. If you become sick again with covid after getting better, please schedule a follow up virtual visit and isolate again.  -can use tylenol if needed for fevers, aches and pains per instructions  -nasal saline sinus rinses twice daily  -stay hydrated, drink plenty of fluids and eat small healthy meals - avoid dairy  -can take 1000 IU (25mcg) Vit D3 and 100-500 mg of Vit C daily per instructions  -follow up with your doctor in 2-3 days unless improving and feeling better  -stay home while sick, except to seek medical care. If you have COVID19, you will likely be contagious for 7-10 days. Flu or Influenza is likely contagious for about 7 days. Other respiratory viral infections remain contagious for 5-10+ days depending on the virus and many other factors. Wear a good mask that fits snugly (such as N95 or KN95) if around others to reduce the risk of transmission.  It was nice to meet you today, and I really hope you are feeling better soon. I help [facility] out with telemedicine visits on Tuesdays and Thursdays and am happy to help if you need a follow up virtual visit on those days. Otherwise, if you have any concerns or questions following this visit please schedule a follow up visit with your Primary Care doctor or seek care at a local urgent care clinic to avoid delays in care.    Seek in person  care or schedule a follow up video visit promptly if your symptoms worsen, new concerns arise or you are not improving with treatment. Call 911 and/or seek emergency care if your symptoms are severe or life threatening.  PLEASE SEE THE FOLLOWING LINK FOR THE MOST UPDATED INFORMATION ABOUT LAGEVRIO:  [email or web address]      Fact Sheet for Patients And Caregivers Emergency Use Authorization (EUA) Of LAGEVRIOT (molnupiravir) capsules For Coronavirus Disease 2019 (COVID-19)  What is the most important information I should know about LAGEVRIO? LAGEVRIO may cause serious side effects, including: ? LAGEVRIO may cause harm to your unborn baby. It is not known if LAGEVRIO will harm your baby if you take LAGEVRIO during pregnancy. o LAGEVRIO is not recommended for use in pregnancy. o LAGEVRIO has not been studied in pregnancy. LAGEVRIO was studied in pregnant animals only. When LAGEVRIO was given to pregnant animals, LAGEVRIO caused harm to their unborn babies. o You and your healthcare provider may decide that you should take LAGEVRIO during pregnancy if there are no other COVID-19 treatment options approved or authorized by the FDA that are accessible or clinically appropriate for you. o If you and your healthcare provider decide that you should take LAGEVRIO during pregnancy, you and your healthcare provider should discuss the known and potential benefits and the potential risks of taking LAGEVRIO during pregnancy. For individuals who are able to become pregnant: ? You   should use a reliable method of birth control (contraception) consistently and correctly during treatment with LAGEVRIO and for 4 days after the last dose of LAGEVRIO. Talk to your healthcare provider about reliable birth control methods. ? Before starting treatment with LAGEVRIO your healthcare provider may do a pregnancy test to see if you are pregnant before starting treatment with LAGEVRIO. ? Tell your healthcare  provider right away if you become pregnant or think you may be pregnant during treatment with LAGEVRIO. Pregnancy Surveillance Program: ? There is a pregnancy surveillance program for individuals who take LAGEVRIO during pregnancy. The purpose of this program is to collect information about the health of you and your baby. Talk to your healthcare provider about how to take part in this program. ? If you take LAGEVRIO during pregnancy and you agree to participate in the pregnancy surveillance program and allow your healthcare provider to share your information with [employer], then your healthcare provider will report your use of LAGEVRIO during pregnancy to [employer] by calling 1-877-888-4231 or pregnancyreporting.msd.com. For individuals who are sexually active with partners who are able to become pregnant: ? It is not known if LAGEVRIO can affect sperm. While the risk is regarded as low, animal studies to fully assess the potential for LAGEVRIO to affect the babies of males treated with LAGEVRIO have not been completed. A reliable method of birth control (contraception) should be used consistently and correctly during treatment with LAGEVRIO and for at least 3 months after the last dose. The risk to sperm beyond 3 months is not known. Studies to understand the risk to sperm beyond 3 months are ongoing. Talk to your healthcare provider about reliable birth control methods. Talk to your healthcare provider if you have questions or concerns about how LAGEVRIO may affect sperm. You are being given this fact sheet because your healthcare provider believes it is necessary to provide you with LAGEVRIO for the treatment of adults with mild-to-moderate coronavirus disease 2019 (COVID-19) with positive results of direct SARS-CoV-2 viral testing, and who are at high risk for progression to severe COVID-19 including hospitalization or death, and for whom other COVID-19  treatment options approved or authorized by the FDA are not accessible or clinically appropriate. The [place].S. Food and Drug Administration (FDA) has issued an Emergency Use Authorization (EUA) to make LAGEVRIO available during the COVID-19 pandemic (for more details about an EUA please see "What is an Emergency Use Authorization?" at the end of this document). LAGEVRIO is not an FDA-approved medicine in the [place]. Read this Fact Sheet for information about LAGEVRIO. Talk to your healthcare provider about your options if you have any questions. It is your choice to take LAGEVRIO.  What is COVID-19? COVID-19 is caused by a virus called a coronavirus. You can get COVID-19 through close contact with another person who has the virus. COVID-19 illnesses have ranged from very mild-to-severe, including illness resulting in death. While information so far suggests that most COVID-19 illness is mild, serious illness can happen and may cause some of your other medical conditions to become worse. Older people and people of all ages with severe, long lasting (chronic) medical conditions like heart disease, lung disease and diabetes, for example seem to be at higher risk of being hospitalized for COVID-19.  What is LAGEVRIO? LAGEVRIO is an investigational medicine used to treat mild-to-moderate COVID-19 in adults: ? with positive results of direct SARS-CoV-2 viral testing, and ? who are at high risk for   progression to severe COVID-19 including hospitalization or death, and for whom other COVID-19 treatment options approved or authorized by the FDA are not accessible or clinically appropriate. The FDA has authorized the emergency use of LAGEVRIO for the treatment of mild-tomoderate COVID-19 in adults under an EUA. For more information on EUA, see the "What is an Emergency Use Authorization (EUA)?" section at the end of this Fact Sheet. LAGEVRIO is not authorized: ? for use in people less than 18  years of age. ? for prevention of COVID-19. ? for people needing hospitalization for COVID-19. ? for use for longer than 5 consecutive days.  What should I tell my healthcare provider before I take LAGEVRIO? Tell your healthcare provider if you: ? Have any allergies ? Are breastfeeding or plan to breastfeed ? Have any serious illnesses ? Are taking any medicines (prescription, over-the-counter, vitamins, or herbal products).  How do I take LAGEVRIO? ? Take LAGEVRIO exactly as your healthcare provider tells you to take it. ? Take 4 capsules of LAGEVRIO every 12 hours (for example, at 8 am and at 8 pm) ? Take LAGEVRIO for 5 days. It is important that you complete the full 5 days of treatment with LAGEVRIO. Do not stop taking LAGEVRIO before you complete the full 5 days of treatment, even if you feel better. ? Take LAGEVRIO with or without food. ? You should stay in isolation for as long as your healthcare provider tells you to. Talk to your healthcare provider if you are not sure about how to properly isolate while you have COVID-19. ? Swallow LAGEVRIO capsules whole. Do not open, break, or crush the capsules. If you cannot swallow capsules whole, tell your healthcare provider. ? What to do if you miss a dose: o If it has been less than 10 hours since the missed dose, take it as soon as you remember o If it has been more than 10 hours since the missed dose, skip the missed dose and take your dose at the next scheduled time. ? Do not double the dose of LAGEVRIO to make up for a missed dose.  What are the important possible side effects of LAGEVRIO? ? See, "What is the most important information I should know about LAGEVRIO?" ? Allergic Reactions. Allergic reactions can happen in people taking LAGEVRIO, even after only 1 dose. Stop taking LAGEVRIO and call your healthcare provider right away if you get any of the following symptoms of an allergic reaction: o hives o rapid  heartbeat o trouble swallowing or breathing o swelling of the mouth, lips, or face o throat tightness o hoarseness o skin rash The most common side effects of LAGEVRIO are: ? diarrhea ? nausea ? dizziness These are not all the possible side effects of LAGEVRIO. Not many people have taken LAGEVRIO. Serious and unexpected side effects may happen. This medicine is still being studied, so it is possible that all of the risks are not known at this time.  What other treatment choices are there?  Veklury (remdesivir) is FDA-approved as an intravenous (IV) infusion for the treatment of mildto-moderate COVID-19 in certain adults and children. Talk with your doctor to see if Veklury is appropriate for you. Like LAGEVRIO, FDA may also allow for the emergency use of other medicines to treat people with COVID-19. Go to https://www.fda.gov/emergency-preparedness-and-response/mcm-legalregulatory-and-policy-framework/emergency-use-authorization for more information. It is your choice to be treated or not to be treated with LAGEVRIO. Should you decide not to take it, it will not change your standard medical   care.  What if I am breastfeeding? Breastfeeding is not recommended during treatment with LAGEVRIO and for 4 days after the last dose of LAGEVRIO. If you are breastfeeding or plan to breastfeed, talk to your healthcare provider about your options and specific situation before taking LAGEVRIO.  How do I report side effects with LAGEVRIO? Contact your healthcare provider if you have any side effects that bother you or do not go away. Report side effects to FDA MedWatch at www.fda.gov/medwatch or call 1-800-FDA-1088 (1- 800-332-1088).  How should I store LAGEVRIO? ? Store LAGEVRIO capsules at room temperature between 68F to 77F (20C to 25C). ? Keep LAGEVRIO and all medicines out of the reach of children and pets. How can I learn more about COVID-19? ? Ask your healthcare provider. ? Visit  www.cdc.gov/COVID19 ? Contact your local or state public health department. ? Call [employer] at 1-800-672-6372 (toll free in the [place] ? Visit www.molnupiravir.com  What Is an Emergency Use Authorization (EUA)? The [place] FDA has made LAGEVRIO available under an emergency access mechanism called an Emergency Use Authorization (EUA) The EUA is supported by a Secretary of Health and Human Service (HHS) declaration that circumstances exist to justify emergency use of drugs and biological products during the COVID-19 pandemic. LAGEVRIO for the treatment of mild-to-moderate COVID-19 in adults with positive results of direct SARS-CoV-2 viral testing, who are at high risk for progression to severe COVID-19, including hospitalization or death, and for whom alternative COVID-19 treatment options approved or authorized by FDA are not accessible or clinically appropriate, has not undergone the same type of review as an FDA-approved product. In issuing an EUA under the COVID-19 public health emergency, the FDA has determined, among other things, that based on the total amount of scientific evidence available including data from adequate and well-controlled clinical trials, if available, it is reasonable to believe that the product may be effective for diagnosing, treating, or preventing COVID-19, or a serious or life-threatening disease or condition caused by COVID-19; that the known and potential benefits of the product, when used to diagnose, treat, or prevent such disease or condition, outweigh the known and potential risks of such product; and that there are no adequate, approved, and available alternatives.  All of these criteria must be met to allow for the product to be used in the treatment of patients during the COVID-19 pandemic. The EUA for LAGEVRIO is in effect for the duration of the COVID-19 declaration justifying emergency use of LAGEVRIO, unless terminated or  revoked (after which LAGEVRIO may no longer be used under the EUA). For patent information: www.msd.com/research/patent Copyright  2021-2022 [employer] [place], [place] and its affiliates. All rights reserved. [id number] Revised: March 2022  

## 2022-10-09 NOTE — Progress Notes (Signed)
Virtual Visit via Video Note  I connected with Kara Harvey  on 10/09/22 at 11:20 AM EDT by a video enabled telemedicine application and verified that I am speaking with the correct person using two identifiers.  Location patient: [place] Location provider:work or home office Persons participating in the virtual visit: patient, provider  I discussed the limitations and requested verbal permission for telemedicine visit. The patient expressed understanding and agreed to proceed.   HPI:  Acute telemedicine visit for Covid19: -Onset: Husband had it last week, then she started getting very mild symptoms 3 days ago, tested positive for covid yesterday -Symptoms include: mild congestion, feels a little tired -Denies: fever, NVD, CP, SOB -Pertinent past medical history: see below, had covid once last October - she took Legevrio at the time and is requesting again -Pertinent medication allergies: Allergies  Allergen Reactions   Sulfa Antibiotics Rash  -COVID-19 vaccine status:  had all the boosters - last in october Immunization History  Administered Date(s) Administered   Fluad Quad(high Dose 65+) 04/08/2019, 04/18/2020, 04/13/2022   Influenza Split 04/20/2014   Influenza, High Dose Seasonal PF 04/07/2018   Influenza,inj,Quad PF,6+ Mos 03/27/2013   Influenza-Unspecified 04/18/2015, 04/23/2016, 04/23/2017, 04/24/2017   Moderna Covid-19 Vaccine Bivalent Booster 19yrs & up 03/26/2021, 11/28/2021   PFIZER(Purple Top)SARS-COV-2 Vaccination 08/14/2019, 09/04/2019, 03/28/2020, 11/01/2020   PNEUMOCOCCAL CONJUGATE-20 09/17/2021   Pneumococcal Conjugate-13 09/03/2013   Pneumococcal Polysaccharide-23 03/27/2010, 04/26/2017   Tdap 09/14/2014   Zoster Recombinat (Shingrix) 02/14/2018, 04/25/2018   Zoster, Live 05/22/2013     ROS: See pertinent positives and negatives per HPI.  Past Medical History:  Diagnosis Date   Closed malleolar fracture, unspecified laterality, with routine healing, subsequent  encounter 03/04/2014   occured in  July 2013   Fracture of finger, left, closed 04/28/2017   Humerus fracture 07/13/2017   Hyperlipidemia    Hypertension     Past Surgical History:  Procedure Laterality Date   BREAST BIOPSY Left 04/11/2022   x'2   BREAST BIOPSY  06/12/2022   MM LT RADIOACTIVE SEED LOC MAMMO GUIDE 06/12/2022 [facility] M[facility]MO[facility]AP[facility]   BREAST EXCISIONAL BIOPSY Left 1988   benign   BREAST LUMPECTOMY WITH RADIOACTIVE SEED AND SENTINEL LYMPH NODE BIOPSY Left 06/13/2022   Procedure: LEFT BREAST LUMPECTOMY WITH RADIOACTIVE SEED AND SENTINEL LYMPH NODE BIOPSY;  Surgeon: Erroll Luna, MD;  Location: [facility];  Service: General;  Laterality: Left;   BREAST SURGERY Left 1986   for false positive/biopsy   EYE SURGERY Left 6wks ago   cataract   FRACTURE SURGERY Left 01/14/2011   ankle   TONSILECTOMY, ADENOIDECTOMY, BILATERAL MYRINGOTOMY AND TUBES Bilateral 1957   does not believe adenoids were removed   TUBAL LIGATION  1985     Current Outpatient Medications:    ALPRAZolam (XANAX) 0.5 MG tablet, Take 0.5 tablets (0.25 mg total) by mouth 2 (two) times daily as needed for anxiety., Disp: 30 tablet, Rfl: 5   amLODipine (NORVASC) 5 MG tablet, TAKE 1 AND 1/2 TABLETS(7.5 MG) BY MOUTH DAILY, Disp: 135 tablet, Rfl: 0   anastrozole (ARIMIDEX) 1 MG tablet, Take 1 tablet (1 mg total) by mouth daily., Disp: 90 tablet, Rfl: 3   atorvastatin (LIPITOR) 20 MG tablet, TAKE 1 TABLET(20 MG) BY MOUTH DAILY, Disp: 90 tablet, Rfl: 3   buPROPion ER (WELLBUTRIN SR) 100 MG 12 hr tablet, TAKE 1 TABLET(100 MG) BY MOUTH TWICE DAILY, Disp: 60 tablet, Rfl: 5   calcium carbonate (TUMS - DOSED IN MG ELEMENTAL CALCIUM) 500 MG chewable tablet,  Chew 1 tablet (200 mg of elemental calcium total) by mouth 3 (three) times daily as needed for indigestion or heartburn., Disp: 30 tablet, Rfl: 0   Cholecalciferol (VITAMIN D3) 1000 UNITS CAPS, Take 1,000 Units by mouth daily., Disp: , Rfl:    COMIRNATY  SUSP injection, , Disp: , Rfl:    famotidine (PEPCID) 20 MG tablet, Take 20 mg by mouth 2 (two) times daily., Disp: , Rfl:    losartan (COZAAR) 100 MG tablet, TAKE 1 TABLET(100 MG) BY MOUTH DAILY, Disp: 90 tablet, Rfl: 1   molnupiravir EUA (LAGEVRIO) 200 mg CAPS capsule, Take 4 capsules (800 mg total) by mouth 2 (two) times daily for 5 days., Disp: 40 capsule, Rfl: 0   ondansetron (ZOFRAN) 4 MG tablet, Take 1 tablet (4 mg total) by mouth every 8 (eight) hours as needed for nausea or vomiting., Disp: 30 tablet, Rfl: 0   valACYclovir (VALTREX) 1000 MG tablet, TAKE 2 TABLETS(2000 MG) BY MOUTH TWICE DAILY, Disp: 20 tablet, Rfl: 0  Current Facility-Administered Medications:    cyanocobalamin ((VITAMIN B-12)) injection 1,000 mcg, 1,000 mcg, Intramuscular, Once, Derrel Nip, Aris Everts, MD  EXAM:  VITALS per patient if applicable:  GENERAL: alert, oriented, appears well and in no acute distress  HEENT: atraumatic, conjunttiva clear, no obvious abnormalities on inspection of external nose and ears  NECK: normal movements of the head and neck  LUNGS: on inspection no signs of respiratory distress, breathing rate appears normal, no obvious gross SOB, gasping or wheezing  CV: no obvious cyanosis  MS: moves all visible extremities without noticeable abnormality  PSYCH/NEURO: pleasant and cooperative, no obvious depression or anxiety, speech and thought processing grossly intact  ASSESSMENT AND PLAN:  Discussed the following assessment and plan:  COVID-19   Discussed treatment options, side effect and risk of drug interactions, ideal treatment window, potential complications, isolation and precautions for COVID-19.  After lengthy discussion, the patient opted for treatment with Legevrio per her preference as reports she took this due to potential drug interactions last time and did well.  ther symptomatic care measures summarized in patient instructions. Advised to seek prompt virtual visit or in  person care if worsening, new symptoms arise, or if is not improving with treatment as expected per our conversation of expected course. Discussed options for follow up care. Did let this patient know that I do telemedicine on Tuesdays and Thursdays for [place] and those are the days I am logged into the system. Advised to schedule follow up visit with PCP, [facility] virtual visits or [facility] if any further questions or concerns to avoid delays in care.   I discussed the assessment and treatment plan with the patient. The patient was provided an opportunity to ask questions and all were answered. The patient agreed with the plan and demonstrated an understanding of the instructions.     Lucretia Kern, DO

## 2022-11-16 ENCOUNTER — Other Ambulatory Visit: Payer: Self-pay | Admitting: Internal Medicine

## 2022-11-27 ENCOUNTER — Ambulatory Visit (INDEPENDENT_AMBULATORY_CARE_PROVIDER_SITE_OTHER): Payer: Medicare Other | Admitting: Dermatology

## 2022-11-27 VITALS — BP 130/75 | HR 80

## 2022-11-27 DIAGNOSIS — L821 Other seborrheic keratosis: Secondary | ICD-10-CM | POA: Diagnosis not present

## 2022-11-27 DIAGNOSIS — D485 Neoplasm of uncertain behavior of skin: Secondary | ICD-10-CM

## 2022-11-27 DIAGNOSIS — I8393 Asymptomatic varicose veins of bilateral lower extremities: Secondary | ICD-10-CM

## 2022-11-27 DIAGNOSIS — D2261 Melanocytic nevi of right upper limb, including shoulder: Secondary | ICD-10-CM | POA: Diagnosis not present

## 2022-11-27 DIAGNOSIS — L814 Other melanin hyperpigmentation: Secondary | ICD-10-CM

## 2022-11-27 DIAGNOSIS — Z1283 Encounter for screening for malignant neoplasm of skin: Secondary | ICD-10-CM

## 2022-11-27 DIAGNOSIS — L720 Epidermal cyst: Secondary | ICD-10-CM

## 2022-11-27 DIAGNOSIS — D692 Other nonthrombocytopenic purpura: Secondary | ICD-10-CM | POA: Diagnosis not present

## 2022-11-27 DIAGNOSIS — X32XXXA Exposure to sunlight, initial encounter: Secondary | ICD-10-CM | POA: Diagnosis not present

## 2022-11-27 DIAGNOSIS — L578 Other skin changes due to chronic exposure to nonionizing radiation: Secondary | ICD-10-CM

## 2022-11-27 DIAGNOSIS — L729 Follicular cyst of the skin and subcutaneous tissue, unspecified: Secondary | ICD-10-CM

## 2022-11-27 DIAGNOSIS — D225 Melanocytic nevi of trunk: Secondary | ICD-10-CM | POA: Diagnosis not present

## 2022-11-27 DIAGNOSIS — L988 Other specified disorders of the skin and subcutaneous tissue: Secondary | ICD-10-CM

## 2022-11-27 DIAGNOSIS — W908XXA Exposure to other nonionizing radiation, initial encounter: Secondary | ICD-10-CM

## 2022-11-27 DIAGNOSIS — D229 Melanocytic nevi, unspecified: Secondary | ICD-10-CM

## 2022-11-27 NOTE — Progress Notes (Signed)
Follow-Up Visit   Subjective  Kara Harvey is a 82 y.o. female who presents for the following: Skin Cancer Screening and Full Body Skin Exam  The patient presents for Total-Body Skin Exam (TBSE) for skin cancer screening and mole check. The patient has spots, moles and lesions to be evaluated, some may be new or changing. She has an irritated spot on the left lower back. No history of skin cancer.   The following portions of the chart were reviewed this encounter and updated as appropriate: medications, allergies, medical history  Review of Systems:  No other skin or systemic complaints except as noted in HPI or Assessment and Plan.  Objective  Well appearing patient in no apparent distress; mood and affect are within normal limits.  A full examination was performed including scalp, head, eyes, ears, nose, lips, neck, chest, axillae, abdomen, back, buttocks, bilateral upper extremities, bilateral lower extremities, hands, feet, fingers, toes, fingernails, and toenails. All findings within normal limits unless otherwise noted below.   Relevant physical exam findings are noted in the Assessment and Plan.  Left Lower Back at Waistline 4.0 mm pink papule       Assessment & Plan   LENTIGINES, SEBORRHEIC KERATOSES, HEMANGIOMAS - Benign normal skin lesions - Benign-appearing - Call for any changes  FACIAL ELASTOSIS Exam: Rhytides and volume loss or the bilateral nasolabial and oral commissures.  Treatment Plan: Discussed filler, minimum of 2-3 syringes to marionette lines. Patient has had filler in the past, >10 years ago.   Recommend daily broad spectrum sunscreen SPF 30+ to sun-exposed areas, reapply every 2 hours as needed. Call for new or changing lesions.  Staying in the shade or wearing long sleeves, sun glasses (UVA+UVB protection) and wide brim hats (4-inch brim around the entire circumference of the hat) are also recommended for sun protection.   MELANOCYTIC  NEVI - Tan-brown and/or pink-flesh-colored symmetric macules and papules  left spinal upper back 3 mm two toned brown macule    Right upper arm below shoulder 2 mm med dark brown macule  - Benign appearing on exam today.  Stable. - Observation - Call clinic for new or changing moles - Recommend daily use of broad spectrum spf 30+ sunscreen to sun-exposed areas.   ACTINIC DAMAGE - Chronic condition, secondary to cumulative UV/sun exposure - diffuse scaly erythematous macules with underlying dyspigmentation - Recommend daily broad spectrum sunscreen SPF 30+ to sun-exposed areas, reapply every 2 hours as needed.  - Staying in the shade or wearing long sleeves, sun glasses (UVA+UVB protection) and wide brim hats (4-inch brim around the entire circumference of the hat) are also recommended for sun protection.  - Call for new or changing lesions.  EPIDERMAL INCLUSION CYST Exam: Firm white subcutaneous nodule at left axilla  Benign-appearing. Exam most consistent with an epidermal inclusion cyst. Discussed that a cyst is a benign growth that can grow over time and sometimes get irritated or inflamed. Recommend observation if it is not bothersome. Discussed option of surgical excision to remove it if it is growing, symptomatic, or other changes noted. Please call for new or changing lesions so they can be evaluated.  SKIN CANCER SCREENING PERFORMED TODAY.  Varicose Veins/Spider Veins - Dilated blue, purple or red veins at the lower extremities - Reassured - Smaller vessels can be treated by sclerotherapy (a procedure to inject a medicine into the veins to make them disappear) if desired, but the treatment is not covered by insurance. Larger vessels may be covered  if symptomatic and we would refer to vascular surgeon if treatment desired.  Purpura - Chronic; persistent and recurrent.  Treatable, but not curable. - Violaceous macules and patches - Benign - Related to trauma, age, sun  damage and/or use of blood thinners, chronic use of topical and/or oral steroids - Observe - Can use OTC arnica containing moisturizer such as Dermend Bruise Formula if desired - Call for worsening or other concerns   Neoplasm of uncertain behavior of skin Left Lower Back at Waistline  Epidermal / dermal shaving  Lesion diameter (cm):  0.4 Informed consent: discussed and consent obtained   Patient was prepped and draped in usual sterile fashion: Area prepped with alcohol. Anesthesia: the lesion was anesthetized in a standard fashion   Anesthetic:  1% lidocaine w/ epinephrine 1-100,000 buffered w/ 8.4% NaHCO3 Instrument used: flexible razor blade   Hemostasis achieved with: pressure, aluminum chloride and electrodesiccation   Outcome: patient tolerated procedure well   Post-procedure details: wound care instructions given   Post-procedure details comment:  Ointment and small bandage applied  Specimen 1 - Surgical pathology Differential Diagnosis: Irritated Nevus vs other Check Margins: No   Return in about 1 year (around 11/27/2023) for TBSE.  ICherlyn Labella, CMA, am acting as scribe for Willeen Niece, MD .   Documentation: I have reviewed the above documentation for accuracy and completeness, and I agree with the above.  Willeen Niece, MD

## 2022-11-27 NOTE — Patient Instructions (Addendum)
Wound Care Instructions  Cleanse wound gently with soap and water once a day then pat dry with clean gauze. Apply a thin coat of Petrolatum (petroleum jelly, "Vaseline") over the wound (unless you have an allergy to this). We recommend that you use a new, sterile tube of Vaseline. Do not pick or remove scabs. Do not remove the yellow or white "healing tissue" from the base of the wound.  Cover the wound with fresh, clean, nonstick gauze and secure with paper tape. You may use Band-Aids in place of gauze and tape if the wound is small enough, but would recommend trimming much of the tape off as there is often too much. Sometimes Band-Aids can irritate the skin.  You should call the office for your biopsy report after 1 week if you have not already been contacted.  If you experience any problems, such as abnormal amounts of bleeding, swelling, significant bruising, significant pain, or evidence of infection, please call the office immediately.  FOR ADULT SURGERY PATIENTS: If you need something for pain relief you may take 1 extra strength Tylenol (acetaminophen) AND 2 Ibuprofen (200mg each) together every 4 hours as needed for pain. (do not take these if you are allergic to them or if you have a reason you should not take them.) Typically, you may only need pain medication for 1 to 3 days.     Due to recent changes in healthcare laws, you may see results of your pathology and/or laboratory studies on MyChart before the doctors have had a chance to review them. We understand that in some cases there may be results that are confusing or concerning to you. Please understand that not all results are received at the same time and often the doctors may need to interpret multiple results in order to provide you with the best plan of care or course of treatment. Therefore, we ask that you please give us 2 business days to thoroughly review all your results before contacting the office for clarification. Should  we see a critical lab result, you will be contacted sooner.   If You Need Anything After Your Visit  If you have any questions or concerns for your doctor, please call our main line at [phone] and press option 4 to reach your doctor's medical assistant. If no one answers, please leave a voicemail as directed and we will return your call as soon as possible. Messages left after 4 pm will be answered the following business day.   You may also send us a message via MyChart. We typically respond to MyChart messages within 1-2 business days.  For prescription refills, please ask your pharmacy to contact our office. Our fax number is [phone].  If you have an urgent issue when the clinic is closed that cannot wait until the next business day, you can page your doctor at the number below.    Please note that while we do our best to be available for urgent issues outside of office hours, we are not available 24/7.   If you have an urgent issue and are unable to reach us, you may choose to seek medical care at your doctor's office, retail clinic, urgent care center, or emergency room.  If you have a medical emergency, please immediately call 911 or go to the emergency department.  Pager Numbers  - Dr. Kowalski: [phone]  - Dr. Moye: [phone]  - Dr. Stewart: [phone]  In the event of inclement weather, please call our main line at   [phone] for an update on the status of any delays or closures.  Dermatology Medication Tips: Please keep the boxes that topical medications come in in order to help keep track of the instructions about where and how to use these. Pharmacies typically print the medication instructions only on the boxes and not directly on the medication tubes.   If your medication is too expensive, please contact our office at [phone] option 4 or send us a message through MyChart.   We are unable to tell what your co-pay for medications will be in  advance as this is different depending on your insurance coverage. However, we may be able to find a substitute medication at lower cost or fill out paperwork to get insurance to cover a needed medication.   If a prior authorization is required to get your medication covered by your insurance company, please allow us 1-2 business days to complete this process.  Drug prices often vary depending on where the prescription is filled and some pharmacies may offer cheaper prices.  The website www.goodrx.com contains coupons for medications through different pharmacies. The prices here do not account for what the cost may be with help from insurance (it may be cheaper with your insurance), but the website can give you the price if you did not use any insurance.  - You can print the associated coupon and take it with your prescription to the pharmacy.  - You may also stop by our office during regular business hours and pick up a GoodRx coupon card.  - If you need your prescription sent electronically to a different pharmacy, notify our office through [facility] MyChart or by phone at [phone] option 4.     Si Usted Necesita Algo Despus de Su Visita  Tambin puede enviarnos un mensaje a travs de MyChart. Por lo general respondemos a los mensajes de MyChart en el transcurso de 1 a 2 das hbiles.  Para renovar recetas, por favor pida a su farmacia que se ponga en contacto con nuestra oficina. Nuestro nmero de fax es el [phone].  Si tiene un asunto urgente cuando la clnica est cerrada y que no puede esperar hasta el siguiente da hbil, puede llamar/localizar a su doctor(a) al nmero que aparece a continuacin.   Por favor, tenga en cuenta que aunque hacemos todo lo posible para estar disponibles para asuntos urgentes fuera del horario de oficina, no estamos disponibles las 24 horas del da, los 7 das de la semana.   Si tiene un problema urgente y no puede comunicarse con nosotros, puede  optar por buscar atencin mdica  en el consultorio de su doctor(a), en una clnica privada, en un centro de atencin urgente o en una sala de emergencias.  Si tiene una emergencia mdica, por favor llame inmediatamente al 911 o vaya a la sala de emergencias.  Nmeros de bper  - Dr. Kowalski: [phone]  - Dra. Moye: [phone]  - Dra. Stewart: [phone]  En caso de inclemencias del tiempo, por favor llame a nuestra lnea principal al [phone] para una actualizacin sobre el estado de cualquier retraso o cierre.  Consejos para la medicacin en dermatologa: Por favor, guarde las cajas en las que vienen los medicamentos de uso tpico para ayudarle a seguir las instrucciones sobre dnde y cmo usarlos. Las farmacias generalmente imprimen las instrucciones del medicamento slo en las cajas y no directamente en los tubos del medicamento.   Si su medicamento es muy caro, por favor, pngase en contacto con   nuestra oficina llamando al [phone] y presione la opcin 4 o envenos un mensaje a travs de MyChart.   No podemos decirle cul ser su copago por los medicamentos por adelantado ya que esto es diferente dependiendo de la cobertura de su seguro. Sin embargo, es posible que podamos encontrar un medicamento sustituto a menor costo o llenar un formulario para que el seguro cubra el medicamento que se considera necesario.   Si se requiere una autorizacin previa para que su compaa de seguros cubra su medicamento, por favor permtanos de 1 a 2 das hbiles para completar este proceso.  Los precios de los medicamentos varan con frecuencia dependiendo del lugar de dnde se surte la receta y alguna farmacias pueden ofrecer precios ms baratos.  El sitio web www.goodrx.com tiene cupones para medicamentos de diferentes farmacias. Los precios aqu no tienen en cuenta lo que podra costar con la ayuda del seguro (puede ser ms barato con su seguro), pero el sitio web puede darle el  precio si no utiliz ningn seguro.  - Puede imprimir el cupn correspondiente y llevarlo con su receta a la farmacia.  - Tambin puede pasar por nuestra oficina durante el horario de atencin regular y recoger una tarjeta de cupones de GoodRx.  - Si necesita que su receta se enve electrnicamente a una farmacia diferente, informe a nuestra oficina a travs de MyChart de [facility] o por telfono llamando al [phone] y presione la opcin 4.  

## 2022-12-03 ENCOUNTER — Telehealth: Payer: Self-pay

## 2022-12-03 ENCOUNTER — Ambulatory Visit: Payer: Medicare Other | Attending: Hematology and Oncology

## 2022-12-03 VITALS — Wt 158.4 lb

## 2022-12-03 DIAGNOSIS — Z483 Aftercare following surgery for neoplasm: Secondary | ICD-10-CM

## 2022-12-03 NOTE — Therapy (Signed)
OUTPATIENT PHYSICAL THERAPY SOZO SCREENING NOTE   Patient Name: Kara Harvey MRN: [id number] DOB:Sep 25, 1940, 82 y.o., female Today's Date: 12/03/2022  PCP: Sherlene Shams, MD REFERRING PROVIDER: Sherlene Shams, MD   PT End of Session - 12/03/22 1546     Visit Number 2   # unchanged due to screen only   PT Start Time 1545    PT Stop Time 1549    PT Time Calculation (min) 4 min    Activity Tolerance Patient tolerated treatment well    Behavior During Therapy [facility] for tasks assessed/performed             Past Medical History:  Diagnosis Date   Closed malleolar fracture, unspecified laterality, with routine healing, subsequent encounter 03/04/2014   occured in  July 2013   Fracture of finger, left, closed 04/28/2017   Humerus fracture 07/13/2017   Hyperlipidemia    Hypertension    Past Surgical History:  Procedure Laterality Date   BREAST BIOPSY Left 04/11/2022   x'2   BREAST BIOPSY  06/12/2022   MM LT RADIOACTIVE SEED LOC MAMMO GUIDE 06/12/2022 [facility] MAMMOGRAP[facility]   BREAST EXCISIONAL BIOPSY Left 1988   benign   BREAST LUMPECTOMY WITH RADIOACTIVE SEED AND SENTINEL LYMPH NODE BIOPSY Left 06/13/2022   Procedure: LEFT BREAST LUMPECTOMY WITH RADIOACTIVE SEED AND SENTINEL LYMPH NODE BIOPSY;  Surgeon: Harriette Bouillon, MD;  Location: [facility];  Service: General;  Laterality: Left;   BREAST SURGERY Left 1986   for false positive/biopsy   EYE SURGERY Left 6wks ago   cataract   FRACTURE SURGERY Left 01/14/2011   ankle   TONSILECTOMY, ADENOIDECTOMY, BILATERAL MYRINGOTOMY AND TUBES Bilateral 1957   does not believe adenoids were removed   TUBAL LIGATION  1985   Patient Active Problem List   Diagnosis Date Noted   COVID-19 04/19/2022   Malignant neoplasm of upper-outer quadrant of left breast in female, estrogen receptor positive (HCC) 04/16/2022   Malignant neoplasm of upper-outer quadrant of left female breast (HCC) 04/13/2022   Encounter for  preventive health examination 09/01/2020   Personal history of colonic polyps 06/24/2020   Anxiety as acute reaction to gross stress 12/20/2018   Vitamin D deficiency 04/29/2016   Osteopenia 04/26/2016   Long-term use of high-risk medication 01/25/2016   Depressive disorder 01/25/2016   Cataract extraction status of left eye 03/04/2014   Overweight (BMI 25.0-29.9) 09/05/2013   Urge incontinence 03/27/2013   Essential hypertension, benign 03/27/2013   Hyperlipidemia LDL goal <130 03/27/2013    REFERRING DIAG: left breast cancer at risk for lymphedema  THERAPY DIAG:  Aftercare following surgery for neoplasm  PERTINENT HISTORY: Patient was diagnosed on 04/11/2022 with left grade 2 Invasive Lobular Carcinoma. She had a left lumpectomy and sentinel node biopsy (0/4 positive) on 06/13/2022. It is ER +, PR negative, and Her 2 negative with a Ki67 of 15%. She is scheduled for left lumpectomy with SLNB on 06/13/2022   PRECAUTIONS: left UE Lymphedema risk, None  SUBJECTIVE: Pt returns for her 3 month L-Dex screen.   PAIN:  Are you having pain? No  SOZO SCREENING: Patient was assessed today using the SOZO machine to determine the lymphedema index score. This was compared to her baseline score. It was determined that she is within the recommended range when compared to her baseline and no further action is needed at this time. She will continue SOZO screenings. These are done every 3 months for 2 years post operatively followed by every 6  months for 2 years, and then annually.   L-DEX FLOWSHEETS - 12/03/22 1500       L-DEX LYMPHEDEMA SCREENING   Measurement Type Unilateral    L-DEX MEASUREMENT EXTREMITY Upper Extremity    POSITION  Standing    DOMINANT SIDE Right    At Risk Side Left    BASELINE SCORE (UNILATERAL) -0.4    L-DEX SCORE (UNILATERAL) -0.6    VALUE CHANGE (UNILAT) -0.2               Hermenia Bers, PTA 12/03/2022, 3:47 PM

## 2022-12-03 NOTE — Telephone Encounter (Signed)
-----   Message from Willeen Niece, MD sent at 12/03/2022 12:56 PM EDT ----- Skin , left lower back at waistline MELANOCYTIC NEVUS, INTRADERMAL TYPE, IRRITATED  Benign irritated mole - please call patient

## 2022-12-03 NOTE — Telephone Encounter (Signed)
Advised pt of bx results/sh ?

## 2022-12-06 ENCOUNTER — Other Ambulatory Visit: Payer: Self-pay | Admitting: Internal Medicine

## 2022-12-11 ENCOUNTER — Other Ambulatory Visit: Payer: Self-pay | Admitting: Internal Medicine

## 2023-01-18 ENCOUNTER — Other Ambulatory Visit: Payer: Self-pay | Admitting: Internal Medicine

## 2023-03-05 ENCOUNTER — Other Ambulatory Visit: Payer: Self-pay | Admitting: Internal Medicine

## 2023-03-06 ENCOUNTER — Other Ambulatory Visit: Payer: Self-pay

## 2023-03-06 MED ORDER — ATORVASTATIN CALCIUM 20 MG PO TABS: ORAL_TABLET | ORAL | 0 refills | Status: DC

## 2023-03-11 ENCOUNTER — Ambulatory Visit: Payer: Medicare Other | Attending: Hematology and Oncology

## 2023-03-11 VITALS — Wt 152.4 lb

## 2023-03-11 DIAGNOSIS — Z483 Aftercare following surgery for neoplasm: Secondary | ICD-10-CM | POA: Insufficient documentation

## 2023-03-11 NOTE — Therapy (Signed)
OUTPATIENT PHYSICAL THERAPY SOZO SCREENING NOTE   Patient Name: Kara Harvey MRN: [id number] DOB:October 27, 1940, 82 y.o., female Today's Date: 03/11/2023  PCP: Sherlene Shams, MD REFERRING PROVIDER: Serena Croissant, MD   PT End of Session - 03/11/23 1553     Visit Number 2   # unchanged due to screen only   PT Start Time 1550    PT Stop Time 1555    PT Time Calculation (min) 5 min    Activity Tolerance Patient tolerated treatment well    Behavior During Therapy [facility] for tasks assessed/performed             Past Medical History:  Diagnosis Date   Closed malleolar fracture, unspecified laterality, with routine healing, subsequent encounter 03/04/2014   occured in  July 2013   Fracture of finger, left, closed 04/28/2017   Humerus fracture 07/13/2017   Hyperlipidemia    Hypertension    Past Surgical History:  Procedure Laterality Date   BREAST BIOPSY Left 04/11/2022   x'2   BREAST BIOPSY  06/12/2022   MM LT RADIOACTIVE SEED LOC MAMMO GUIDE 06/12/2022 [facility] MAMMOGRAPHY   BREAST EXCISIONAL BIOPSY Left 1988   benign   BREAST LUMPECTOMY WITH RADIOACTIVE SEED AND SENTINEL LYMPH NODE BIOPSY Left 06/13/2022   Procedure: LEFT BREAST LUMPECTOMY WITH RADIOACTIVE SEED AND SENTINEL LYMPH NODE BIOPSY;  Surgeon: Harriette Bouillon, MD;  Location: [facility];  Service: General;  Laterality: Left;   BREAST SURGERY Left 1986   for false positive/biopsy   EYE SURGERY Left 6wks ago   cataract   FRACTURE SURGERY Left 01/14/2011   ankle   TONSILECTOMY, ADENOIDECTOMY, BILATERAL MYRINGOTOMY AND TUBES Bilateral 1957   does not believe adenoids were removed   TUBAL LIGATION  1985   Patient Active Problem List   Diagnosis Date Noted   COVID-19 04/19/2022   Malignant neoplasm of upper-outer quadrant of left breast in female, estrogen receptor positive (HCC) 04/16/2022   Malignant neoplasm of upper-outer quadrant of left female breast (HCC) 04/13/2022   Encounter for preventive  health examination 09/01/2020   Personal history of colonic polyps 06/24/2020   Anxiety as acute reaction to gross stress 12/20/2018   Vitamin D deficiency 04/29/2016   Osteopenia 04/26/2016   Long-term use of high-risk medication 01/25/2016   Depressive disorder 01/25/2016   Cataract extraction status of left eye 03/04/2014   Overweight (BMI 25.0-29.9) 09/05/2013   Urge incontinence 03/27/2013   Essential hypertension, benign 03/27/2013   Hyperlipidemia LDL goal <130 03/27/2013    REFERRING DIAG: left breast cancer at risk for lymphedema  THERAPY DIAG:  Aftercare following surgery for neoplasm  PERTINENT HISTORY: Patient was diagnosed on 04/11/2022 with left grade 2 Invasive Lobular Carcinoma. She had a left lumpectomy and sentinel node biopsy (0/4 positive) on 06/13/2022. It is ER +, PR negative, and Her 2 negative with a Ki67 of 15%. She is scheduled for left lumpectomy with SLNB on 06/13/2022   PRECAUTIONS: left UE Lymphedema risk, None  SUBJECTIVE: Pt returns for her 3 month L-Dex screen.   PAIN:  Are you having pain? No  SOZO SCREENING: Patient was assessed today using the SOZO machine to determine the lymphedema index score. This was compared to her baseline score. It was determined that she is within the recommended range when compared to her baseline and no further action is needed at this time. She will continue SOZO screenings. These are done every 3 months for 2 years post operatively followed by every 6 months  for 2 years, and then annually.   L-DEX FLOWSHEETS - 03/11/23 1500       L-DEX LYMPHEDEMA SCREENING   Measurement Type Unilateral    L-DEX MEASUREMENT EXTREMITY Upper Extremity    POSITION  Standing    DOMINANT SIDE Right    At Risk Side Left    BASELINE SCORE (UNILATERAL) -0.4    L-DEX SCORE (UNILATERAL) -2.9    VALUE CHANGE (UNILAT) -2.5               Hermenia Bers, PTA 03/11/2023, 3:56 PM

## 2023-03-19 ENCOUNTER — Telehealth: Payer: Self-pay

## 2023-03-19 NOTE — Telephone Encounter (Signed)
Pt LVM wanting to reschedule appt with Dr. Pamelia Hoit to discuss mammogram results.  Pt reminded it usually takes around 3 days for the mammogram results to come back. Pt verbalized understanding and is going to reschedule her 6 month f/u with Dr. Pamelia Hoit for the week of September 23rd, 2024.

## 2023-03-20 ENCOUNTER — Other Ambulatory Visit: Payer: Self-pay | Admitting: Internal Medicine

## 2023-03-20 ENCOUNTER — Telehealth: Payer: Self-pay | Admitting: Hematology and Oncology

## 2023-03-21 ENCOUNTER — Inpatient Hospital Stay: Payer: Medicare Other | Admitting: Hematology and Oncology

## 2023-03-29 ENCOUNTER — Ambulatory Visit
Admission: RE | Admit: 2023-03-29 | Discharge: 2023-03-29 | Disposition: A | Discharge status: Home / Self Care | Payer: Medicare Other | Source: Ambulatory Visit | Attending: Adult Health | Admitting: Adult Health

## 2023-03-29 DIAGNOSIS — Z853 Personal history of malignant neoplasm of breast: Secondary | ICD-10-CM | POA: Diagnosis not present

## 2023-03-29 DIAGNOSIS — Z170 Estrogen receptor positive status [ER+]: Secondary | ICD-10-CM

## 2023-04-08 ENCOUNTER — Ambulatory Visit (INDEPENDENT_AMBULATORY_CARE_PROVIDER_SITE_OTHER): Payer: Medicare Other

## 2023-04-08 DIAGNOSIS — Z23 Encounter for immunization: Secondary | ICD-10-CM | POA: Diagnosis not present

## 2023-04-10 ENCOUNTER — Inpatient Hospital Stay: Payer: Medicare Other | Attending: Hematology and Oncology | Admitting: Hematology and Oncology

## 2023-04-10 VITALS — BP 148/63 | HR 75 | Temp 97.9°F | Resp 18 | Ht 62.0 in | Wt 158.4 lb

## 2023-04-10 DIAGNOSIS — Z170 Estrogen receptor positive status [ER+]: Secondary | ICD-10-CM | POA: Diagnosis not present

## 2023-04-10 DIAGNOSIS — Z79811 Long term (current) use of aromatase inhibitors: Secondary | ICD-10-CM | POA: Insufficient documentation

## 2023-04-10 DIAGNOSIS — C50412 Malignant neoplasm of upper-outer quadrant of left female breast: Secondary | ICD-10-CM | POA: Insufficient documentation

## 2023-04-10 MED ORDER — ESTRADIOL 0.1 MG/GM VA CREA: 1.0000 | TOPICAL_CREAM | Freq: Every day | VAGINAL | 12 refills | Status: DC

## 2023-04-10 NOTE — Progress Notes (Signed)
Patient Care Team: Sherlene Shams, MD as PCP - General (Internal Medicine) Serena Croissant, MD as Consulting Physician (Hematology and Oncology) Harriette Bouillon, MD as Consulting Physician (General Surgery)  DIAGNOSIS:  Encounter Diagnosis  Name Primary?   Malignant neoplasm of upper-outer quadrant of left breast in female, estrogen receptor positive (HCC) Yes    SUMMARY OF ONCOLOGIC HISTORY: Oncology History  Malignant neoplasm of upper-outer quadrant of left breast in female, estrogen receptor positive (HCC)  04/11/2022 Initial Diagnosis   Remote history left lumpectomy, screening mammogram detected left breast distortion by ultrasound measured 1.7 cm 11:30 position and irregular hypoechoic mass 1.4 cm, axilla negative, biopsy revealed grade 2 invasive lobular cancer at the 11:30 position ER 60%, PR 0%, Ki-67 15%, HER2 2+ by IHC, FISH negative ratio 1.55.  Second mass 1:00: Benign   05/11/2022 Cancer Staging   Staging form: Breast, AJCC 8th Edition - Clinical: Stage IA (cT1c, cN0, cM0, G2, ER+, PR-, HER2-) - Signed by Serena Croissant, MD on 05/11/2022 Stage prefix: Initial diagnosis Histologic grading system: 3 grade system   06/13/2022 Surgery   Left lumpectomy: Grade 2 ILC 2.1 cm, LCIS, margins negative, 0/4 lymph nodes negative, ER 60%, PR 0%, HER2 negative, Ki-67 15%    06/2022 -  Anti-estrogen oral therapy   1 mg Anastrozole     CHIEF COMPLIANT: Follow-up on anastrozole    History of Present Illness   The patient, with a history of breast cancer, presents for a follow-up visit. She has been on a journey with breast cancer for almost a year and has been taking anastrozole since December. She reports no major issues with the medication, but express concerns about potential side effects such as skin dryness and vaginal dryness. She has been using a suppository for the vaginal dryness, but are open to other options for relief.  The patient also mentions that she has lost  weight and are working on maintaining a healthy lifestyle. She has a positive outlook and are pleased with the results of their recent mammograms.         ALLERGIES:  is allergic to sulfa antibiotics.  MEDICATIONS:  Current Outpatient Medications  Medication Sig Dispense Refill   ALPRAZolam (XANAX) 0.5 MG tablet Take 0.5 tablets (0.25 mg total) by mouth 2 (two) times daily as needed for anxiety. 30 tablet 5   amLODipine (NORVASC) 5 MG tablet TAKE 1 AND 1/2 TABLETS(7.5 MG) BY MOUTH DAILY 45 tablet 0   anastrozole (ARIMIDEX) 1 MG tablet Take 1 tablet (1 mg total) by mouth daily. 90 tablet 3   atorvastatin (LIPITOR) 20 MG tablet TAKE 1 TABLET(20 MG) BY MOUTH DAILY 90 tablet 0   buPROPion ER (WELLBUTRIN SR) 100 MG 12 hr tablet TAKE 1 TABLET(100 MG) BY MOUTH TWICE DAILY 180 tablet 0   calcium carbonate (TUMS - DOSED IN MG ELEMENTAL CALCIUM) 500 MG chewable tablet Chew 1 tablet (200 mg of elemental calcium total) by mouth 3 (three) times daily as needed for indigestion or heartburn. 30 tablet 0   Cholecalciferol (VITAMIN D3) 1000 UNITS CAPS Take 1,000 Units by mouth daily.     COMIRNATY SUSP injection      estradiol (ESTRACE VAGINAL) 0.1 MG/GM vaginal cream Place 1 Applicatorful vaginally at bedtime. 42.5 g 12   famotidine (PEPCID) 20 MG tablet Take 20 mg by mouth 2 (two) times daily.     losartan (COZAAR) 100 MG tablet TAKE 1 TABLET(100 MG) BY MOUTH DAILY 90 tablet 1   ondansetron (ZOFRAN) 4  MG tablet Take 1 tablet (4 mg total) by mouth every 8 (eight) hours as needed for nausea or vomiting. 30 tablet 0   valACYclovir (VALTREX) 1000 MG tablet TAKE 2 TABLETS(2000 MG) BY MOUTH TWICE DAILY 20 tablet 0   Current Facility-Administered Medications  Medication Dose Route Frequency Provider Last Rate Last Admin   cyanocobalamin ((VITAMIN B-12)) injection 1,000 mcg  1,000 mcg Intramuscular Once Sherlene Shams, MD        PHYSICAL EXAMINATION: ECOG PERFORMANCE STATUS: 1 - Symptomatic but completely  ambulatory  Vitals:   04/10/23 1052  BP: (!) 148/63  Pulse: 75  Resp: 18  Temp: 97.9 F (36.6 C)  SpO2: 98%   Filed Weights   04/10/23 1052  Weight: 158 lb 6.4 oz (71.8 kg)    LABORATORY DATA:  I have reviewed the data as listed    Latest Ref Rng & Units 11/22/2021    2:23 PM 06/24/2020    4:37 PM 11/17/2019    8:49 AM  CMP  Glucose 70 - 99 mg/dL 91  98  865   BUN 6 - 23 mg/dL 17  25  22    Creatinine 0.40 - 1.20 mg/dL 7.84  6.96  2.95   Sodium 135 - 145 mEq/L 137  138  136   Potassium 3.5 - 5.1 mEq/L 4.0  4.3  4.0   Chloride 96 - 112 mEq/L 102  100  100   CO2 19 - 32 mEq/L 25  27  31    Calcium 8.4 - 10.5 mg/dL 9.4  9.7  9.5   Total Protein 6.0 - 8.3 g/dL 6.5  6.6  6.6   Total Bilirubin 0.2 - 1.2 mg/dL 0.5  0.4  0.6   Alkaline Phos 39 - 117 U/L 77   82   AST 0 - 37 U/L 13  15  16    ALT 0 - 35 U/L 13  13  16      Lab Results  Component Value Date   WBC 8.7 10/13/2019   HGB 14.1 10/13/2019   HCT 43.0 10/13/2019   MCV 88.1 10/13/2019   PLT 278 10/13/2019   NEUTROABS 5.1 10/13/2019    ASSESSMENT & PLAN:  Malignant neoplasm of upper-outer quadrant of left breast in female, estrogen receptor positive (HCC) 06/13/2022: Left lumpectomy: Grade 2 ILC 2.1 cm, LCIS, margins negative, 0/4 lymph nodes negative, ER 60%, PR 0%, HER2 negative, Ki-67 15%   Treatment plan: Adjuvant radiation therapy (patient does not want to do radiation) Followed by adjuvant antiestrogen therapy with anastrozole 1 mg daily started 06/21/2022   We did not perform Oncotype because of her age and lobular nature of the breast cancer.    Anastrozole toxicities: Tolerating it extremely well without any problems.  Vaginal dryness: I encouraged her to use coconut oil and also sent a prescription for Estrace.  She will use that twice a week.  Breast cancer surveillance: Mammogram 03/29/2023: Benign breast density category C Return to clinic in 1 year for follow-up   No orders of the defined types  were placed in this encounter.  The patient has a good understanding of the overall plan. she agrees with it. she will call with any problems that may develop before the next visit here. Total time spent: 30 mins including face to face time and time spent for planning, charting and co-ordination of care   Tamsen Meek, MD 04/10/23

## 2023-04-10 NOTE — Assessment & Plan Note (Addendum)
06/13/2022: Left lumpectomy: Grade 2 ILC 2.1 cm, LCIS, margins negative, 0/4 lymph nodes negative, ER 60%, PR 0%, HER2 negative, Ki-67 15%   Treatment plan: Adjuvant radiation therapy (patient does not want to do radiation) Followed by adjuvant antiestrogen therapy with anastrozole 1 mg daily started 06/21/2022   We did not perform Oncotype because of her age and lobular nature of the breast cancer.    Anastrozole toxicities: Tolerating it extremely well without any problems.  Vaginal dryness: I encouraged her to use coconut oil and also sent a prescription for Estrace.  She will use that twice a week.  Breast cancer surveillance: Mammogram 03/29/2023: Benign breast density category C Return to clinic in 1 year for follow-up

## 2023-04-19 ENCOUNTER — Other Ambulatory Visit: Payer: Self-pay | Admitting: Internal Medicine

## 2023-04-23 DIAGNOSIS — Z961 Presence of intraocular lens: Secondary | ICD-10-CM | POA: Diagnosis not present

## 2023-04-23 DIAGNOSIS — H43813 Vitreous degeneration, bilateral: Secondary | ICD-10-CM | POA: Diagnosis not present

## 2023-04-23 DIAGNOSIS — H2511 Age-related nuclear cataract, right eye: Secondary | ICD-10-CM | POA: Diagnosis not present

## 2023-05-12 ENCOUNTER — Other Ambulatory Visit: Payer: Self-pay | Admitting: Internal Medicine

## 2023-05-15 ENCOUNTER — Other Ambulatory Visit: Payer: Self-pay | Admitting: Internal Medicine

## 2023-05-15 ENCOUNTER — Telehealth: Payer: Self-pay | Admitting: Internal Medicine

## 2023-05-15 NOTE — Telephone Encounter (Signed)
Patient called [facility] and they did not receive prescription, please resend to [facility].

## 2023-05-15 NOTE — Telephone Encounter (Signed)
Medication was refilled on 05/13/2023 and pt is aware.

## 2023-05-15 NOTE — Telephone Encounter (Signed)
Patient just called and said she is out of one of her medications. The amLODipine (NORVASC) 5 MG tablet. The pharmacy she use is [id number] - [place], [place] - [address] AT [facility] [address] [facility] [address] [place], [place] [address] Phone: [phone]  Fax: [phone] Her number is [phone]

## 2023-05-15 NOTE — Telephone Encounter (Signed)
Spoke with pharmacy and resent rx. Pt is aware.

## 2023-05-28 ENCOUNTER — Ambulatory Visit (INDEPENDENT_AMBULATORY_CARE_PROVIDER_SITE_OTHER): Payer: Medicare Other | Admitting: Internal Medicine

## 2023-05-28 ENCOUNTER — Encounter: Payer: Self-pay | Admitting: Internal Medicine

## 2023-05-28 VITALS — BP 112/62 | HR 85 | Temp 97.9°F | Ht 62.0 in | Wt 157.8 lb

## 2023-05-28 DIAGNOSIS — E559 Vitamin D deficiency, unspecified: Secondary | ICD-10-CM

## 2023-05-28 DIAGNOSIS — N952 Postmenopausal atrophic vaginitis: Secondary | ICD-10-CM

## 2023-05-28 DIAGNOSIS — E663 Overweight: Secondary | ICD-10-CM

## 2023-05-28 DIAGNOSIS — E785 Hyperlipidemia, unspecified: Secondary | ICD-10-CM

## 2023-05-28 DIAGNOSIS — C50412 Malignant neoplasm of upper-outer quadrant of left female breast: Secondary | ICD-10-CM

## 2023-05-28 DIAGNOSIS — I10 Essential (primary) hypertension: Secondary | ICD-10-CM

## 2023-05-28 DIAGNOSIS — I83813 Varicose veins of bilateral lower extremities with pain: Secondary | ICD-10-CM | POA: Diagnosis not present

## 2023-05-28 DIAGNOSIS — Z170 Estrogen receptor positive status [ER+]: Secondary | ICD-10-CM | POA: Diagnosis not present

## 2023-05-28 DIAGNOSIS — Z0000 Encounter for general adult medical examination without abnormal findings: Secondary | ICD-10-CM | POA: Diagnosis not present

## 2023-05-28 MED ORDER — ATORVASTATIN CALCIUM 20 MG PO TABS: ORAL_TABLET | ORAL | 0 refills | Status: DC

## 2023-05-28 MED ORDER — LOSARTAN POTASSIUM 100 MG PO TABS: ORAL_TABLET | ORAL | 1 refills | Status: DC

## 2023-05-28 MED ORDER — BUPROPION HCL ER (SR) 100 MG PO TB12: ORAL_TABLET | ORAL | 1 refills | Status: DC

## 2023-05-28 NOTE — Assessment & Plan Note (Signed)
S/p lumpectomy .  XRT deferred by patient.  She is tolerating anastrozole and was prescribed estrogen vaginal cream by her oncologist for the vaginal atrophy and dryness . Mammogram was normal Sept 2024; continue annual screening

## 2023-05-28 NOTE — Patient Instructions (Addendum)
Ask pharmacist  about the quantity needed for  90 day supply of Estrace  (for use twice weekly0  BUY THE SMALLEST SIZE DILATOR,  COAT WITH OIL BEFORE INSERTING,  LEAVE IN FOR 5 MINUTES   Continue vitamin D    Referral to Vein specialists in [place] in process  Tell [facility] that Dr Darrick Huntsman made a referral for Richard because he may have skin CA

## 2023-05-28 NOTE — Assessment & Plan Note (Signed)
Continue estrace twice weekly,  coconut oil, use of vaginal dilators

## 2023-05-28 NOTE — Progress Notes (Signed)
Patient ID: Kara Harvey, female    DOB: 27-Jun-1941  Age: 82 y.o. MRN: [id number]  The patient is here for annual preventive  examination and management of other chronic and acute problems.   The risk factors are reflected in the social history.  The roster of all physicians providing medical care to patient - is listed in the Snapshot section of the chart.  Activities of daily living:  The patient is 100% independent in all ADLs: dressing, toileting, feeding as well as independent mobility  Home safety : The patient has smoke detectors in the home. They wear seatbelts.  There are no firearms at home. There is no violence in the home.   There is no risks for hepatitis, STDs or HIV. There is no   history of blood transfusion. They have no travel history to infectious disease endemic areas of the world.  The patient has seen their dentist in the last six month. They have seen their eye doctor in the last year. They admit to slight hearing difficulty with regard to whispered voices and some television programs.  They have deferred audiologic testing in the last year.  They do not  have excessive sun exposure. Discussed the need for sun protection: hats, long sleeves and use of sunscreen if there is significant sun exposure.   Diet: the importance of a healthy diet is discussed. They do have a healthy diet.  The benefits of regular aerobic exercise were discussed. She walks 4 times per week ,  20 minutes.   Depression screen: there are no signs or vegative symptoms of depression- irritability, change in appetite, anhedonia, sadness/tearfullness.  Cognitive assessment: the patient manages all their financial and personal affairs and is actively engaged. They could relate day,date,year and events; recalled 2/3 objects at 3 minutes; performed clock-face test normally.  The following portions of the patient's history were reviewed and updated as appropriate: allergies, current medications, past  family history, past medical history,  past surgical history, past social history  and problem list.  Visual acuity was not assessed per patient preference since she has regular follow up with her ophthalmologist. Hearing and body mass index were assessed and reviewed.   During the course of the visit the patient was educated and counseled about appropriate screening and preventive services including : fall prevention , diabetes screening, nutrition counseling, colorectal cancer screening, and recommended immunizations.    CC: The primary encounter diagnosis was Essential hypertension, benign. Diagnoses of Hyperlipidemia LDL goal <130, Overweight (BMI 25.0-29.9), Malignant neoplasm of upper-outer quadrant of left breast in female, estrogen receptor positive (HCC), Encounter for preventive health examination, Vitamin D deficiency, Varicose veins of bilateral lower extremities with pain, and Vaginal atrophy were also pertinent to this visit.   Varicose veins : both legs.  Painful.  Wants treatment  Vaginal stenosis and dryness . Her oncologist has given her estrogen  cream to manage atrophy caused by anastrozole for management of H/o BRCA     History Kara Harvey has a past medical history of Closed malleolar fracture, unspecified laterality, with routine healing, subsequent encounter (03/04/2014), Fracture of finger, left, closed (04/28/2017), Humerus fracture (07/13/2017), Hyperlipidemia, and Hypertension.   She has a past surgical history that includes Fracture surgery (Left, 01/14/2011); Tonsilectomy, adenoidectomy, bilateral myringotomy and tubes (Bilateral, 1957); Tubal ligation (1985); Breast surgery (Left, 1986); Eye surgery (Left, 6wks ago); Breast excisional biopsy (Left, 1988); Breast biopsy (Left, 04/11/2022); Breast biopsy (06/12/2022); and Breast lumpectomy with radioactive seed and sentinel lymph node biopsy (Left,  06/13/2022).   Her family history includes Alzheimer's disease in her  father; Arthritis in her father; Cancer in her mother; Colon cancer in her paternal aunt; Hyperlipidemia in her mother.She reports that she quit smoking about 44 years ago. Her smoking use included cigarettes. She has never used smokeless tobacco. She reports current alcohol use of about 5.0 standard drinks of alcohol per week. She reports that she does not use drugs.  Outpatient Medications Prior to Visit  Medication Sig Dispense Refill   ALPRAZolam (XANAX) 0.5 MG tablet Take 0.5 tablets (0.25 mg total) by mouth 2 (two) times daily as needed for anxiety. 30 tablet 5   amLODipine (NORVASC) 5 MG tablet TAKE 1 AND 1/2 TABLETS(7.5 MG) BY MOUTH DAILY 135 tablet 2   anastrozole (ARIMIDEX) 1 MG tablet Take 1 tablet (1 mg total) by mouth daily. 90 tablet 3   calcium carbonate (TUMS - DOSED IN MG ELEMENTAL CALCIUM) 500 MG chewable tablet Chew 1 tablet (200 mg of elemental calcium total) by mouth 3 (three) times daily as needed for indigestion or heartburn. 30 tablet 0   Cholecalciferol (VITAMIN D3) 1000 UNITS CAPS Take 1,000 Units by mouth daily.     estradiol (ESTRACE VAGINAL) 0.1 MG/GM vaginal cream Place 1 Applicatorful vaginally at bedtime. 42.5 g 12   famotidine (PEPCID) 20 MG tablet Take 20 mg by mouth 2 (two) times daily.     valACYclovir (VALTREX) 1000 MG tablet TAKE 2 TABLETS(2000 MG) BY MOUTH TWICE DAILY 20 tablet 0   atorvastatin (LIPITOR) 20 MG tablet TAKE 1 TABLET(20 MG) BY MOUTH DAILY 90 tablet 0   buPROPion ER (WELLBUTRIN SR) 100 MG 12 hr tablet TAKE 1 TABLET(100 MG) BY MOUTH TWICE DAILY 180 tablet 0   losartan (COZAAR) 100 MG tablet TAKE 1 TABLET(100 MG) BY MOUTH DAILY 90 tablet 1   COMIRNATY SUSP injection      ondansetron (ZOFRAN) 4 MG tablet Take 1 tablet (4 mg total) by mouth every 8 (eight) hours as needed for nausea or vomiting. 30 tablet 0   Facility-Administered Medications Prior to Visit  Medication Dose Route Frequency Provider Last Rate Last Admin   cyanocobalamin ((VITAMIN  B-12)) injection 1,000 mcg  1,000 mcg Intramuscular Once Sherlene Shams, MD        Review of Systems  Patient denies headache, fevers, malaise, unintentional weight loss, skin rash, eye pain, sinus congestion and sinus pain, sore throat, dysphagia,  hemoptysis , cough, dyspnea, wheezing, chest pain, palpitations, orthopnea, edema, abdominal pain, nausea, melena, diarrhea, constipation, flank pain, dysuria, hematuria, urinary  Frequency, nocturia, numbness, tingling, seizures,  Focal weakness, Loss of consciousness,  Tremor, insomnia, depression, anxiety, and suicidal ideation.    Objective:  BP 112/62   Pulse 85   Temp 97.9 F (36.6 C) (Oral)   Ht 5\' 2"  (1.575 m)   Wt 157 lb 12.8 oz (71.6 kg)   SpO2 97%   BMI 28.86 kg/m   Physical Exam Vitals reviewed.  Constitutional:      General: She is not in acute distress.    Appearance: Normal appearance. She is normal weight. She is not ill-appearing, toxic-appearing or diaphoretic.  HENT:     Head: Normocephalic.  Eyes:     General: No scleral icterus.       Right eye: No discharge.        Left eye: No discharge.     Conjunctiva/sclera: Conjunctivae normal.  Cardiovascular:     Rate and Rhythm: Normal rate and regular rhythm.  Heart sounds: Normal heart sounds.  Pulmonary:     Effort: Pulmonary effort is normal. No respiratory distress.     Breath sounds: Normal breath sounds.  Musculoskeletal:        General: Normal range of motion.  Skin:    General: Skin is warm and dry.  Neurological:     General: No focal deficit present.     Mental Status: She is alert and oriented to person, place, and time. Mental status is at baseline.  Psychiatric:        Mood and Affect: Mood normal.        Behavior: Behavior normal.        Thought Content: Thought content normal.        Judgment: Judgment normal.      Assessment & Plan:  Essential hypertension, benign -     Comprehensive metabolic panel -     Microalbumin / creatinine  urine ratio  Hyperlipidemia LDL goal <130 -     Lipid panel -     LDL cholesterol, direct  Overweight (BMI 25.0-29.9) -     Hemoglobin A1c -     CBC with Differential/Platelet -     TSH  Malignant neoplasm of upper-outer quadrant of left breast in female, estrogen receptor positive (HCC) Assessment & Plan: S/p lumpectomy .  XRT deferred by patient.  She is tolerating anastrozole and was prescribed estrogen vaginal cream by her oncologist for the vaginal atrophy and dryness . Mammogram was normal Sept 2024; continue annual screening    Encounter for preventive health examination Assessment & Plan: age appropriate education and counseling updated, referrals for preventative services and immunizations addressed, dietary and smoking counseling addressed, most recent labs reviewed.  I have personally reviewed and have noted:   1) the patient's medical and social history 2) The pt's use of alcohol, tobacco, and illicit drugs 3) The patient's current medications and supplements 4) Functional ability including ADL's, fall risk, home safety risk, hearing and visual impairment 5) Diet and physical activities 6) Evidence for depression or mood disorder 7) The patient's height, weight, and BMI have been recorded in the chart    I have made referrals, and provided counseling and education based on review of the above    Vitamin D deficiency -     VITAMIN D 25 Hydroxy (Vit-D Deficiency, Fractures)  Varicose veins of bilateral lower extremities with pain -     Ambulatory referral to Vascular Surgery  Vaginal atrophy Assessment & Plan: Continue estrace twice weekly,  coconut oil, use of vaginal dilators    Other orders -     Atorvastatin Calcium; TAKE 1 TABLET(20 MG) BY MOUTH DAILY  Dispense: 90 tablet; Refill: 0 -     buPROPion HCl ER (SR); TAKE 1 TABLET(100 MG) BY MOUTH TWICE DAILY  Dispense: 180 tablet; Refill: 1 -     Losartan Potassium; TAKE 1 TABLET(100 MG) BY MOUTH DAILY   Dispense: 90 tablet; Refill: 1      I provided 40 minutes of  face-to-face time during this encounter reviewing patient's current problems and past surgeries,  recent labs and imaging studies, providing counseling on the above mentioned problems , and coordination  of care .   Follow-up: Return in about 6 months (around 11/25/2023).   Sherlene Shams, MD

## 2023-05-28 NOTE — Assessment & Plan Note (Signed)

## 2023-05-29 LAB — COMPREHENSIVE METABOLIC PANEL
ALT: 16 U/L (ref 0–35)
AST: 17 U/L (ref 0–37)
Albumin: 4.3 g/dL (ref 3.5–5.2)
Alkaline Phosphatase: 90 U/L (ref 39–117)
BUN: 21 mg/dL (ref 6–23)
CO2: 28 meq/L (ref 19–32)
Calcium: 9.4 mg/dL (ref 8.4–10.5)
Chloride: 100 meq/L (ref 96–112)
Creatinine, Ser: 0.77 mg/dL (ref 0.40–1.20)
GFR: 72.63 mL/min (ref >60.00)
Glucose, Bld: 91 mg/dL (ref 70–99)
Potassium: 4.1 meq/L (ref 3.5–5.1)
Sodium: 135 meq/L (ref 135–145)
Total Bilirubin: 0.5 mg/dL (ref 0.2–1.2)
Total Protein: 6.9 g/dL (ref 6.0–8.3)

## 2023-05-29 LAB — MICROALBUMIN / CREATININE URINE RATIO
Creatinine,U: 24.5 mg/dL
Microalb Creat Ratio: 2.9 mg/g (ref 0.0–30.0)
Microalb, Ur: <0.7 mg/dL (ref 0.0–1.9)

## 2023-05-29 LAB — CBC WITH DIFFERENTIAL/PLATELET
Basophils Absolute: 0.1 10*3/uL (ref 0.0–0.1)
Basophils Relative: 1 % (ref 0.0–3.0)
Eosinophils Absolute: 0.3 10*3/uL (ref 0.0–0.7)
Eosinophils Relative: 3.4 % (ref 0.0–5.0)
HCT: 40.5 % (ref 36.0–46.0)
Hemoglobin: 13.4 g/dL (ref 12.0–15.0)
Lymphocytes Relative: 26.3 % (ref 12.0–46.0)
Lymphs Abs: 2.1 10*3/uL (ref 0.7–4.0)
MCHC: 33.1 g/dL (ref 30.0–36.0)
MCV: 92.6 fL (ref 78.0–100.0)
Monocytes Absolute: 0.8 10*3/uL (ref 0.1–1.0)
Monocytes Relative: 9.8 % (ref 3.0–12.0)
Neutro Abs: 4.7 10*3/uL (ref 1.4–7.7)
Neutrophils Relative %: 59.5 % (ref 43.0–77.0)
Platelets: 341 10*3/uL (ref 150.0–400.0)
RBC: 4.38 Mil/uL (ref 3.87–5.11)
RDW: 13.4 % (ref 11.5–15.5)
WBC: 7.9 10*3/uL (ref 4.0–10.5)

## 2023-05-29 LAB — LIPID PANEL
Cholesterol: 157 mg/dL (ref 0–200)
HDL: 72.8 mg/dL (ref >39.00)
LDL Cholesterol: 64 mg/dL (ref 0–99)
NonHDL: 84.42
Total CHOL/HDL Ratio: 2
Triglycerides: 100 mg/dL (ref 0.0–149.0)
VLDL: 20 mg/dL (ref 0.0–40.0)

## 2023-05-29 LAB — LDL CHOLESTEROL, DIRECT: Direct LDL: 63 mg/dL

## 2023-05-29 LAB — HEMOGLOBIN A1C: Hgb A1c MFr Bld: 5.9 % (ref 4.6–6.5)

## 2023-05-29 LAB — VITAMIN D 25 HYDROXY (VIT D DEFICIENCY, FRACTURES): VITD: 67.48 ng/mL (ref 30.00–100.00)

## 2023-05-29 LAB — TSH: TSH: 1.58 u[IU]/mL (ref 0.35–5.50)

## 2023-05-31 ENCOUNTER — Other Ambulatory Visit: Payer: Self-pay | Admitting: Internal Medicine

## 2023-06-10 ENCOUNTER — Ambulatory Visit: Payer: Medicare Other | Attending: Surgery

## 2023-06-10 VITALS — Wt 153.1 lb

## 2023-06-10 DIAGNOSIS — Z483 Aftercare following surgery for neoplasm: Secondary | ICD-10-CM | POA: Insufficient documentation

## 2023-06-10 NOTE — Therapy (Signed)
OUTPATIENT PHYSICAL THERAPY SOZO SCREENING NOTE   Patient Name: Kara Harvey MRN: [id number] DOB:February 22, 1941, 82 y.o., female Today's Date: 06/10/2023  PCP: Sherlene Shams, MD REFERRING PROVIDER: Harriette Bouillon, MD   PT End of Session - 06/10/23 1507     Visit Number 2   # uncahnged due to screen only   PT Start Time 1504    PT Stop Time 1508    PT Time Calculation (min) 4 min    Activity Tolerance Patient tolerated treatment well    Behavior During Therapy [facility] for tasks assessed/performed             Past Medical History:  Diagnosis Date   Closed malleolar fracture, unspecified laterality, with routine healing, subsequent encounter 03/04/2014   occured in  July 2013   Fracture of finger, left, closed 04/28/2017   Humerus fracture 07/13/2017   Hyperlipidemia    Hypertension    Past Surgical History:  Procedure Laterality Date   BREAST BIOPSY Left 04/11/2022   x'2   BREAST BIOPSY  06/12/2022   MM LT RADIOACTIVE SEED LOC MAMMO GUIDE 06/12/2022 [facility] MAMMOGRAPHY   BREAST EXCISIONAL BIOPSY Left 1988   benign   BREAST LUMPECTOMY WITH RADIOACTIVE SEED AND SENTINEL LYMPH NODE BIOPSY Left 06/13/2022   Procedure: LEFT BREAST LUMPECTOMY WITH RADIOACTIVE SEED AND SENTINEL LYMPH NODE BIOPSY;  Surgeon: Harriette Bouillon, MD;  Location: [facility];  Service: General;  Laterality: Left;   BREAST SURGERY Left 1986   for false positive/biopsy   EYE SURGERY Left 6wks ago   cataract   FRACTURE SURGERY Left 01/14/2011   ankle   TONSILECTOMY, ADENOIDECTOMY, BILATERAL MYRINGOTOMY AND TUBES Bilateral 1957   does not believe adenoids were removed   TUBAL LIGATION  1985   Patient Active Problem List   Diagnosis Date Noted   Vaginal atrophy 05/28/2023   COVID-19 04/19/2022   Malignant neoplasm of upper-outer quadrant of left breast in female, estrogen receptor positive (HCC) 04/16/2022   Malignant neoplasm of upper-outer quadrant of left female breast (HCC)  04/13/2022   Encounter for preventive health examination 09/01/2020   History of colonic polyps 06/24/2020   Anxiety as acute reaction to gross stress 12/20/2018   Vitamin D deficiency 04/29/2016   Osteopenia 04/26/2016   Long-term use of high-risk medication 01/25/2016   Depressive disorder 01/25/2016   Cataract extraction status of left eye 03/04/2014   Overweight (BMI 25.0-29.9) 09/05/2013   Urge incontinence 03/27/2013   Essential hypertension, benign 03/27/2013   Hyperlipidemia LDL goal <130 03/27/2013    REFERRING DIAG: left breast cancer at risk for lymphedema  THERAPY DIAG: Aftercare following surgery for neoplasm  PERTINENT HISTORY: Patient was diagnosed on 04/11/2022 with left grade 2 Invasive Lobular Carcinoma. She had a left lumpectomy and sentinel node biopsy (0/4 positive) on 06/13/2022. It is ER +, PR negative, and Her 2 negative with a Ki67 of 15%. She is scheduled for left lumpectomy with SLNB on 06/13/2022   PRECAUTIONS: left UE Lymphedema risk, None  SUBJECTIVE: Pt returns for her 3 month L-Dex screen.   PAIN:  Are you having pain? No  SOZO SCREENING: Patient was assessed today using the SOZO machine to determine the lymphedema index score. This was compared to her baseline score. It was determined that she is within the recommended range when compared to her baseline and no further action is needed at this time. She will continue SOZO screenings. These are done every 3 months for 2 years post operatively followed by  every 6 months for 2 years, and then annually.   L-DEX FLOWSHEETS - 06/10/23 1500       L-DEX LYMPHEDEMA SCREENING   Measurement Type Unilateral    L-DEX MEASUREMENT EXTREMITY Upper Extremity    POSITION  Standing    DOMINANT SIDE Right    At Risk Side Left    BASELINE SCORE (UNILATERAL) -0.4    L-DEX SCORE (UNILATERAL) 1.9    VALUE CHANGE (UNILAT) 2.3               Hermenia Bers, PTA 06/10/2023, 3:10 PM

## 2023-06-20 ENCOUNTER — Other Ambulatory Visit: Payer: Self-pay | Admitting: Hematology and Oncology

## 2023-07-02 ENCOUNTER — Other Ambulatory Visit: Payer: Self-pay | Admitting: *Deleted

## 2023-07-02 DIAGNOSIS — I8393 Asymptomatic varicose veins of bilateral lower extremities: Secondary | ICD-10-CM

## 2023-07-18 ENCOUNTER — Encounter (HOSPITAL_COMMUNITY): Payer: Medicare Other

## 2023-08-23 ENCOUNTER — Other Ambulatory Visit: Payer: Self-pay | Admitting: Internal Medicine

## 2023-09-09 ENCOUNTER — Ambulatory Visit: Payer: Medicare Other | Attending: Surgery | Admitting: Physical Therapy

## 2023-09-09 DIAGNOSIS — Z483 Aftercare following surgery for neoplasm: Secondary | ICD-10-CM | POA: Insufficient documentation

## 2023-09-09 NOTE — Progress Notes (Unsigned)
 Patient name: Kara Harvey MRN: [id number] DOB: 07-02-1941 Sex: female  REASON FOR CONSULT: Bilateral varicose veins  HPI: Kara Harvey is a 83 y.o. female, with history hypertension hyperlipidemia that presents for evaluation of bilateral varicose veins.  Past Medical History:  Diagnosis Date   Closed malleolar fracture, unspecified laterality, with routine healing, subsequent encounter 03/04/2014   occured in  July 2013   Fracture of finger, left, closed 04/28/2017   Humerus fracture 07/13/2017   Hyperlipidemia    Hypertension     Past Surgical History:  Procedure Laterality Date   BREAST BIOPSY Left 04/11/2022   x'2   BREAST BIOPSY  06/12/2022   MM LT RADIOACTIVE SEED LOC MAMMO GUIDE 06/12/2022 [facility] MAMMOGRAPHY   BREAST EXCISIONAL BIOPSY Left 1988   benign   BREAST LUMPECTOMY WITH RADIOACTIVE SEED AND SENTINEL LYMPH NODE BIOPSY Left 06/13/2022   Procedure: LEFT BREAST LUMPECTOMY WITH RADIOACTIVE SEED AND SENTINEL LYMPH NODE BIOPSY;  Surgeon: Harriette Bouillon, MD;  Location: [facility];  Service: General;  Laterality: Left;   BREAST SURGERY Left 1986   for false positive/biopsy   EYE SURGERY Left 6wks ago   cataract   FRACTURE SURGERY Left 01/14/2011   ankle   TONSILECTOMY, ADENOIDECTOMY, BILATERAL MYRINGOTOMY AND TUBES Bilateral 1957   does not believe adenoids were removed   TUBAL LIGATION  1985    Family History  Problem Relation Age of Onset   Cancer Mother        lung Cancer   Hyperlipidemia Mother    Arthritis Father    Alzheimer's disease Father    Colon cancer Paternal Aunt     SOCIAL HISTORY: Social History   Socioeconomic History   Marital status: Married    Spouse name: Not on file   Number of children: Not on file   Years of education: Not on file   Highest education level: Not on file  Occupational History   Not on file  Tobacco Use   Smoking status: Former    Current packs/day: 0.00    Types: Cigarettes     Quit date: 03/28/1979    Years since quitting: 44.4   Smokeless tobacco: Never  Vaping Use   Vaping status: Never Used  Substance and Sexual Activity   Alcohol use: Yes    Alcohol/week: 5.0 standard drinks of alcohol    Types: 5 Glasses of wine per week   Drug use: No   Sexual activity: Yes  Other Topics Concern   Not on file  Social History Narrative   Not on file   Social Drivers of Health   Financial Resource Strain: Low Risk  (10/23/2021)   Overall Financial Resource Strain (CARDIA)    Difficulty of Paying Living Expenses: Not hard at all  Food Insecurity: No Food Insecurity (10/23/2021)   Hunger Vital Sign    Worried About Running Out of Food in the Last Year: Never true    Ran Out of Food in the Last Year: Never true  Transportation Needs: No Transportation Needs (10/23/2021)   PRAPARE - Administrator, Civil Service (Medical): No    Lack of Transportation (Non-Medical): No  Physical Activity: Not on file  Stress: No Stress Concern Present (10/23/2021)   [employer] of Occupational Health - Occupational Stress Questionnaire    Feeling of Stress : Not at all  Social Connections: Unknown (10/23/2021)   Social Connection and Isolation Panel [NHANES]    Frequency of Communication with Friends  and Family: More than three times a week    Frequency of Social Gatherings with Friends and Family: Once a week    Attends Religious Services: Not on Marketing executive or Organizations: Not on file    Attends Banker Meetings: Not on file    Marital Status: Married  Intimate Partner Violence: Not At Risk (10/23/2021)   Humiliation, Afraid, Rape, and Kick questionnaire    Fear of Current or Ex-Partner: No    Emotionally Abused: No    Physically Abused: No    Sexually Abused: No    Allergies  Allergen Reactions   Sulfa Antibiotics Rash    Current Outpatient Medications  Medication Sig Dispense Refill   ALPRAZolam (XANAX) 0.5 MG tablet  Take 0.5 tablets (0.25 mg total) by mouth 2 (two) times daily as needed for anxiety. 30 tablet 5   amLODipine (NORVASC) 5 MG tablet TAKE 1 AND 1/2 TABLETS(7.5 MG) BY MOUTH DAILY 135 tablet 2   anastrozole (ARIMIDEX) 1 MG tablet TAKE 1 TABLET(1 MG) BY MOUTH DAILY 90 tablet 3   atorvastatin (LIPITOR) 20 MG tablet TAKE 1 TABLET(20 MG) BY MOUTH DAILY 90 tablet 0   buPROPion ER (WELLBUTRIN SR) 100 MG 12 hr tablet TAKE 1 TABLET(100 MG) BY MOUTH TWICE DAILY 180 tablet 1   calcium carbonate (TUMS - DOSED IN MG ELEMENTAL CALCIUM) 500 MG chewable tablet Chew 1 tablet (200 mg of elemental calcium total) by mouth 3 (three) times daily as needed for indigestion or heartburn. 30 tablet 0   Cholecalciferol (VITAMIN D3) 1000 UNITS CAPS Take 1,000 Units by mouth daily.     estradiol (ESTRACE VAGINAL) 0.1 MG/GM vaginal cream Place 1 Applicatorful vaginally at bedtime. 42.5 g 12   famotidine (PEPCID) 20 MG tablet Take 20 mg by mouth 2 (two) times daily.     losartan (COZAAR) 100 MG tablet TAKE 1 TABLET(100 MG) BY MOUTH DAILY 90 tablet 1   valACYclovir (VALTREX) 1000 MG tablet TAKE 2 TABLETS(2000 MG) BY MOUTH TWICE DAILY 20 tablet 0   Current Facility-Administered Medications  Medication Dose Route Frequency Provider Last Rate Last Admin   cyanocobalamin ((VITAMIN B-12)) injection 1,000 mcg  1,000 mcg Intramuscular Once Sherlene Shams, MD        REVIEW OF SYSTEMS:  [X]  denotes positive finding, [ ]  denotes negative finding Cardiac  Comments:  Chest pain or chest pressure: ***   Shortness of breath upon exertion:    Short of breath when lying flat:    Irregular heart rhythm:        Vascular    Pain in calf, thigh, or hip brought on by ambulation:    Pain in feet at night that wakes you up from your sleep:     Blood clot in your veins:    Leg swelling:         Pulmonary    Oxygen at home:    Productive cough:     Wheezing:         Neurologic    Sudden weakness in arms or legs:     Sudden numbness in  arms or legs:     Sudden onset of difficulty speaking or slurred speech:    Temporary loss of vision in one eye:     Problems with dizziness:         Gastrointestinal    Blood in stool:     Vomited blood:  Genitourinary    Burning when urinating:     Blood in urine:        Psychiatric    Major depression:         Hematologic    Bleeding problems:    Problems with blood clotting too easily:        Skin    Rashes or ulcers:        Constitutional    Fever or chills:      PHYSICAL EXAM: There were no vitals filed for this visit.  GENERAL: The patient is a well-nourished female, in no acute distress. The vital signs are documented above. CARDIAC: There is a regular rate and rhythm.  VASCULAR: *** PULMONARY: There is good air exchange bilaterally without wheezing or rales. ABDOMEN: Soft and non-tender with normal pitched bowel sounds.  MUSCULOSKELETAL: There are no major deformities or cyanosis. NEUROLOGIC: No focal weakness or paresthesias are detected. SKIN: There are no ulcers or rashes noted. PSYCHIATRIC: The patient has a normal affect.  DATA:   Lower extremity reflux study  Assessment/Plan:   83 y.o. female, with history hypertension hyperlipidemia that presents for evaluation of bilateral varicose veins.   Cephus Shelling, MD [facility] Office: [phone]

## 2023-09-09 NOTE — Therapy (Signed)
 OUTPATIENT PHYSICAL THERAPY SOZO SCREENING NOTE   Patient Name: Kara Harvey MRN: [id number] DOB:[birth year], 83 y.o., female Today's Date: 09/09/2023  PCP: Sherlene Shams, MD REFERRING PROVIDER: Harriette Bouillon, MD   PT End of Session - 09/09/23 1530     Visit Number 2   unchanged due to screen only   PT Start Time 1530    PT Stop Time 1532    PT Time Calculation (min) 2 min             Past Medical History:  Diagnosis Date   Closed malleolar fracture, unspecified laterality, with routine healing, subsequent encounter 03/04/2014   occured in  July 2013   Fracture of finger, left, closed 04/28/2017   Humerus fracture 07/13/2017   Hyperlipidemia    Hypertension    Past Surgical History:  Procedure Laterality Date   BREAST BIOPSY Left 04/11/2022   x'2   BREAST BIOPSY  06/12/2022   MM LT RADIOACTIVE SEED LOC MAMMO GUIDE 06/12/2022 [facility] MAMMOGRAP[facility]   BREAST EXCISIONAL BIOPSY Left 1988   benign   BREAST LUMPECTOMY WITH RADIOACTIVE SEED AND SENTINEL LYMPH NODE BIOPSY Left 06/13/2022   Procedure: LEFT BREAST LUMPECTOMY WITH RADIOACTIVE SEED AND SENTINEL LYMPH NODE BIOPSY;  Surgeon: Harriette Bouillon, MD;  Location: [facility];  Service: General;  Laterality: Left;   BREAST SURGERY Left 1986   for false positive/biopsy   EYE SURGERY Left 6wks ago   cataract   FRACTURE SURGERY Left 01/14/2011   ankle   TONSILECTOMY, ADENOIDECTOMY, BILATERAL MYRINGOTOMY AND TUBES Bilateral 1957   does not believe adenoids were removed   TUBAL LIGATION  1985   Patient Active Problem List   Diagnosis Date Noted   Vaginal atrophy 05/28/2023   COVID-19 04/19/2022   Malignant neoplasm of upper-outer quadrant of left breast in female, estrogen receptor positive (HCC) 04/16/2022   Malignant neoplasm of upper-outer quadrant of left female breast (HCC) 04/13/2022   Encounter for preventive health examination 09/01/2020   History of colonic polyps 06/24/2020   Anxiety as  acute reaction to gross stress 12/20/2018   Vitamin D deficiency 04/29/2016   Osteopenia 04/26/2016   Long-term use of high-risk medication 01/25/2016   Depressive disorder 01/25/2016   Cataract extraction status of left eye 03/04/2014   Overweight (BMI 25.0-29.9) 09/05/2013   Urge incontinence 03/27/2013   Essential hypertension, benign 03/27/2013   Hyperlipidemia LDL goal <130 03/27/2013    REFERRING DIAG: left breast cancer at risk for lymphedema  THERAPY DIAG: Aftercare following surgery for neoplasm  PERTINENT HISTORY: Patient was diagnosed on 04/11/2022 with left grade 2 Invasive Lobular Carcinoma. She had a left lumpectomy and sentinel node biopsy (0/4 positive) on 06/13/2022. It is ER +, PR negative, and Her 2 negative with a Ki67 of 15%. She is scheduled for left lumpectomy with SLNB on 06/13/2022   PRECAUTIONS: left UE Lymphedema risk, None  SUBJECTIVE: Pt returns for her 3 month L-Dex screen.   PAIN:  Are you having pain? No  SOZO SCREENING: Patient was assessed today using the SOZO machine to determine the lymphedema index score. This was compared to her baseline score. It was determined that she is within the recommended range when compared to her baseline and no further action is needed at this time. She will continue SOZO screenings. These are done every 3 months for 2 years post operatively followed by every 6 months for 2 years, and then annually.   L-DEX FLOWSHEETS - 09/09/23 1500  L-DEX LYMPHEDEMA SCREENING   Measurement Type Unilateral    L-DEX MEASUREMENT EXTREMITY Upper Extremity    POSITION  Standing    DOMINANT SIDE Right    At Risk Side Left    BASELINE SCORE (UNILATERAL) -0.4    L-DEX SCORE (UNILATERAL) -0.9    VALUE CHANGE (UNILAT) -0.5                [employer], PT 09/09/2023, 3:33 PM

## 2023-09-10 ENCOUNTER — Encounter: Payer: Self-pay | Admitting: Vascular Surgery

## 2023-09-10 ENCOUNTER — Ambulatory Visit (HOSPITAL_COMMUNITY)
Admission: RE | Admit: 2023-09-10 | Discharge: 2023-09-10 | Disposition: A | Discharge status: Home / Self Care | Payer: Medicare Other | Source: Ambulatory Visit | Attending: Physician Assistant | Admitting: Physician Assistant

## 2023-09-10 ENCOUNTER — Ambulatory Visit: Payer: Medicare Other | Admitting: Vascular Surgery

## 2023-09-10 VITALS — BP 126/76 | HR 72 | Temp 97.9°F | Resp 18 | Ht 62.0 in | Wt 153.1 lb

## 2023-09-10 DIAGNOSIS — M7989 Other specified soft tissue disorders: Secondary | ICD-10-CM

## 2023-09-10 DIAGNOSIS — I8393 Asymptomatic varicose veins of bilateral lower extremities: Secondary | ICD-10-CM | POA: Insufficient documentation

## 2023-09-10 DIAGNOSIS — I872 Venous insufficiency (chronic) (peripheral): Secondary | ICD-10-CM | POA: Diagnosis not present

## 2023-09-19 ENCOUNTER — Telehealth: Payer: Self-pay | Admitting: Internal Medicine

## 2023-09-19 DIAGNOSIS — H353131 Nonexudative age-related macular degeneration, bilateral, early dry stage: Secondary | ICD-10-CM | POA: Diagnosis not present

## 2023-09-19 DIAGNOSIS — H43813 Vitreous degeneration, bilateral: Secondary | ICD-10-CM | POA: Diagnosis not present

## 2023-09-19 DIAGNOSIS — H26492 Other secondary cataract, left eye: Secondary | ICD-10-CM | POA: Diagnosis not present

## 2023-09-19 DIAGNOSIS — H2511 Age-related nuclear cataract, right eye: Secondary | ICD-10-CM | POA: Diagnosis not present

## 2023-09-19 NOTE — Telephone Encounter (Signed)
 Copied from CRM [id number]. Topic: Medicare AWV >> Sep 19, 2023 11:36 AM Payton Doughty wrote: Reason for CRM: Called 09/19/2023 to sched AWV - NO VOICEMAIL  Verlee Rossetti; Care Guide Ambulatory Clinical Support [place] [facility] Direct Dial: [phone]

## 2023-10-07 DIAGNOSIS — H26492 Other secondary cataract, left eye: Secondary | ICD-10-CM | POA: Diagnosis not present

## 2023-10-16 ENCOUNTER — Telehealth: Payer: Self-pay | Admitting: Internal Medicine

## 2023-10-16 NOTE — Telephone Encounter (Signed)
 Left message with patient to reschedule 5/12 follow-up  appt. She will call  back.   E2C2, please reschedule this pt's visit. [facility]

## 2023-10-18 ENCOUNTER — Other Ambulatory Visit: Payer: Self-pay | Admitting: Internal Medicine

## 2023-10-20 ENCOUNTER — Other Ambulatory Visit: Payer: Self-pay | Admitting: Internal Medicine

## 2023-10-21 NOTE — Telephone Encounter (Signed)
 Spoke with the patient to see if she is in a current flare up and she is currently in the middle of a flare up. Patient says she would like to have a full prescription to have on hand when she has a flare up, which she will. She likes to have them and stay ahead of the flare up. Patient says she has been under a lot of stress here lately. Patient would like a full prescription. Please advise?

## 2023-11-25 ENCOUNTER — Ambulatory Visit: Payer: Medicare Other | Admitting: Internal Medicine

## 2023-11-26 ENCOUNTER — Ambulatory Visit: Payer: Medicare Other | Admitting: Dermatology

## 2023-12-05 ENCOUNTER — Other Ambulatory Visit: Payer: Self-pay | Admitting: Internal Medicine

## 2023-12-05 NOTE — Telephone Encounter (Signed)
 Last Fill: 10/18/23  Last OV: 05/28/23 CPE Next OV: 01/07/24  Routing to provider for review/authorization.

## 2023-12-05 NOTE — Telephone Encounter (Signed)
 Copied from CRM [id number]. Topic: Clinical - Medication Refill >> Dec 05, 2023 11:19 AM [place] J wrote: Medication: atorvastatin  (LIPITOR) 20 MG  Has the patient contacted their pharmacy? Yes (Agent: If no, request that the patient contact the pharmacy for the refill. If patient does not wish to contact the pharmacy document the reason why and proceed with request.) (Agent: If yes, when and what did the pharmacy advise?) Pharmacy stated that they contacted us  for a refill but heard nothing back.  This is the patient's preferred pharmacy:   [id number] - [place], [place] - [address] AT [facility] [address] [facility] [address] [place] [address] Phone: [phone] Fax: [phone]  Is this the correct pharmacy for this prescription? Yes If no, delete pharmacy and type the correct one.   Has the prescription been filled recently? No  Is the patient out of the medication? Yes  Has the patient been seen for an appointment in the last year OR does the patient have an upcoming appointment? Yes  Can we respond through MyChart? Yes  Agent: Please be advised that Rx refills may take up to 3 business days. We ask that you follow-up with your pharmacy.

## 2023-12-06 MED ORDER — ATORVASTATIN CALCIUM 20 MG PO TABS: ORAL_TABLET | ORAL | 1 refills | Status: DC

## 2023-12-23 ENCOUNTER — Ambulatory Visit: Payer: Medicare Other | Attending: Surgery

## 2023-12-23 VITALS — Wt 151.5 lb

## 2023-12-23 DIAGNOSIS — Z483 Aftercare following surgery for neoplasm: Secondary | ICD-10-CM | POA: Insufficient documentation

## 2023-12-23 NOTE — Therapy (Signed)
 OUTPATIENT PHYSICAL THERAPY SOZO SCREENING NOTE   Patient Name: Kara Harvey MRN: [id number] DOB:[birth year], 83 y.o., female Today's Date: 12/23/2023  PCP: Thersia Flax, MD REFERRING PROVIDER: Sim Dryer, MD   PT End of Session - 12/23/23 1523     Visit Number 2   # unchanged due to screen only   PT Start Time 1521    PT Stop Time 1525    PT Time Calculation (min) 4 min    Activity Tolerance Patient tolerated treatment well    Behavior During Therapy [facility] for tasks assessed/performed             Past Medical History:  Diagnosis Date   Closed malleolar fracture, unspecified laterality, with routine healing, subsequent encounter 03/04/2014   occured in  July 2013   Fracture of finger, left, closed 04/28/2017   Humerus fracture 07/13/2017   Hyperlipidemia    Hypertension    Past Surgical History:  Procedure Laterality Date   BREAST BIOPSY Left 04/11/2022   x'2   BREAST BIOPSY  06/12/2022   MM LT RADIOACTIVE SEED LOC MAMMO GUIDE 06/12/2022 [facility] MAMMOGRAPHY   BREAST EXCISIONAL BIOPSY Left 1988   benign   BREAST LUMPECTOMY WITH RADIOACTIVE SEED AND SENTINEL LYMPH NODE BIOPSY Left 06/13/2022   Procedure: LEFT BREAST LUMPECTOMY WITH RADIOACTIVE SEED AND SENTINEL LYMPH NODE BIOPSY;  Surgeon: Sim Dryer, MD;  Location: [facility];  Service: General;  Laterality: Left;   BREAST SURGERY Left 1986   for false positive/biopsy   EYE SURGERY Left 6wks ago   cataract   FRACTURE SURGERY Left 01/14/2011   ankle   TONSILECTOMY, ADENOIDECTOMY, BILATERAL MYRINGOTOMY AND TUBES Bilateral 1957   does not believe adenoids were removed   TUBAL LIGATION  1985   Patient Active Problem List   Diagnosis Date Noted   Chronic venous insufficiency 09/10/2023   Vaginal atrophy 05/28/2023   COVID-19 04/19/2022   Malignant neoplasm of upper-outer quadrant of left breast in female, estrogen receptor positive (HCC) 04/16/2022   Malignant neoplasm of upper-outer  quadrant of left female breast (HCC) 04/13/2022   Encounter for preventive health examination 09/01/2020   History of colonic polyps 06/24/2020   Anxiety as acute reaction to gross stress 12/20/2018   Vitamin D  deficiency 04/29/2016   Osteopenia 04/26/2016   Long-term use of high-risk medication 01/25/2016   Depressive disorder 01/25/2016   Cataract extraction status of left eye 03/04/2014   Overweight (BMI 25.0-29.9) 09/05/2013   Urge incontinence 03/27/2013   Essential hypertension, benign 03/27/2013   Hyperlipidemia LDL goal <130 03/27/2013    REFERRING DIAG: left breast cancer at risk for lymphedema  THERAPY DIAG: Aftercare following surgery for neoplasm  PERTINENT HISTORY: Patient was diagnosed on 04/11/2022 with left grade 2 Invasive Lobular Carcinoma. She had a left lumpectomy and sentinel node biopsy (0/4 positive) on 06/13/2022. It is ER +, PR negative, and Her 2 negative with a Ki67 of 15%. She is scheduled for left lumpectomy with SLNB on 06/13/2022   PRECAUTIONS: left UE Lymphedema risk, None  SUBJECTIVE: Pt returns for her 3 month L-Dex screen.   PAIN:  Are you having pain? No  SOZO SCREENING: Patient was assessed today using the SOZO machine to determine the lymphedema index score. This was compared to her baseline score. It was determined that she is within the recommended range when compared to her baseline and no further action is needed at this time. She will continue SOZO screenings. These are done every 3 months for  2 years post operatively followed by every 6 months for 2 years, and then annually.   L-DEX FLOWSHEETS - 12/23/23 1500       L-DEX LYMPHEDEMA SCREENING   Measurement Type Unilateral    L-DEX MEASUREMENT EXTREMITY Upper Extremity    POSITION  Standing    DOMINANT SIDE Right    At Risk Side Left    BASELINE SCORE (UNILATERAL) -0.4    L-DEX SCORE (UNILATERAL) 0.5    VALUE CHANGE (UNILAT) 0.9                Denyce Flank,  PTA 12/23/2023, 3:26 PM

## 2024-01-01 ENCOUNTER — Ambulatory Visit: Payer: Medicare Other | Admitting: Vascular Surgery

## 2024-01-01 ENCOUNTER — Ambulatory Visit: Admitting: Internal Medicine

## 2024-01-07 ENCOUNTER — Encounter: Payer: Self-pay | Admitting: Internal Medicine

## 2024-01-07 ENCOUNTER — Ambulatory Visit (INDEPENDENT_AMBULATORY_CARE_PROVIDER_SITE_OTHER): Admitting: Internal Medicine

## 2024-01-07 VITALS — BP 130/68 | HR 72 | Ht 62.0 in | Wt 153.8 lb

## 2024-01-07 DIAGNOSIS — Z170 Estrogen receptor positive status [ER+]: Secondary | ICD-10-CM

## 2024-01-07 DIAGNOSIS — I10 Essential (primary) hypertension: Secondary | ICD-10-CM

## 2024-01-07 DIAGNOSIS — E785 Hyperlipidemia, unspecified: Secondary | ICD-10-CM

## 2024-01-07 DIAGNOSIS — F430 Acute stress reaction: Secondary | ICD-10-CM

## 2024-01-07 DIAGNOSIS — C50412 Malignant neoplasm of upper-outer quadrant of left female breast: Secondary | ICD-10-CM

## 2024-01-07 DIAGNOSIS — F411 Generalized anxiety disorder: Secondary | ICD-10-CM | POA: Diagnosis not present

## 2024-01-07 MED ORDER — LOSARTAN POTASSIUM 100 MG PO TABS: ORAL_TABLET | ORAL | 1 refills | Status: DC

## 2024-01-07 MED ORDER — BUPROPION HCL ER (SR) 100 MG PO TB12: ORAL_TABLET | ORAL | 1 refills | Status: DC

## 2024-01-07 MED ORDER — AMLODIPINE BESYLATE 5 MG PO TABS: ORAL_TABLET | ORAL | 2 refills | Status: AC

## 2024-01-07 NOTE — Assessment & Plan Note (Signed)
 Annual follow up with oncology  per note from Sept 2024:  Treatment plan: Adjuvant radiation therapy (patient does not want to do radiation) Followed by adjuvant antiestrogen therapy with anastrozole  1 mg daily started 06/21/2022

## 2024-01-07 NOTE — Assessment & Plan Note (Signed)
 Managed with atorvastatin  . Fasting lipids are at goal and LFTs are normal.  No changes today    Lab Results  Component Value Date   CHOL 157 05/28/2023   HDL 72.80 05/28/2023   LDLCALC 64 05/28/2023   LDLDIRECT 63.0 05/28/2023   TRIG 100.0 05/28/2023   CHOLHDL 2 05/28/2023   Lab Results  Component Value Date   ALT 16 05/28/2023   AST 17 05/28/2023   ALKPHOS 90 05/28/2023   BILITOT 0.5 05/28/2023

## 2024-01-07 NOTE — Assessment & Plan Note (Signed)
 Improved and at goal with  increased dose of amlodipine  to 7. 5mg  daily and  100 mg losartan  daily.

## 2024-01-07 NOTE — Assessment & Plan Note (Deleted)
 Annual follow up with oncology  per note from Sept 2024:  Treatment plan: Adjuvant radiation therapy (patient does not want to do radiation) Followed by adjuvant antiestrogen therapy with anastrozole  1 mg daily started 06/21/2022

## 2024-01-07 NOTE — Progress Notes (Signed)
 Subjective:  Patient ID: Kara Harvey, female    DOB: 10/19/1940  Age: 83 y.o. MRN: [id number]  CC: The primary encounter diagnosis was Essential hypertension, benign. Diagnoses of Hyperlipidemia LDL goal <130, Malignant neoplasm of upper-outer quadrant of left breast in female, estrogen receptor positive (HCC), and Anxiety as acute reaction to gross stress were also pertinent to this visit.   HPI Kara Harvey presents for  Chief Complaint  Patient presents with   Medical Management of Chronic Issues    6 month follow up    1) HTN:  Hypertension: patient checks blood pressure twice weekly at home.  Readings have been for the most part <130/80 at rest . Patient is following a reduced salt diet most days and is taking medications as prescribed   2) YOI:unozmjupwh meds  3) breast cancer:  on aromasin  but hates it.  taking vaginal estrogen  per [facility] Sept 2024 helping .   4) Obesity:  has lost 20 lbs intentionally since March 2024 by cutting out sugar   5) caregiver stress:  following Richard's hospitalization in April .  He has improved with [facility] but has dropped 14 lbs since discharge, now 116 . Taking time for herself on a regularly basis now that Charlie is more independent   6) insomnia:  using alprazolam  prn insomnia    Outpatient Medications Prior to Visit  Medication Sig Dispense Refill   ALPRAZolam  (XANAX ) 0.5 MG tablet Take 0.5 tablets (0.25 mg total) by mouth 2 (two) times daily as needed for anxiety. 30 tablet 5   anastrozole  (ARIMIDEX ) 1 MG tablet TAKE 1 TABLET(1 MG) BY MOUTH DAILY 90 tablet 3   atorvastatin  (LIPITOR) 20 MG tablet TAKE 1 TABLET(20 MG) BY MOUTH DAILY 90 tablet 1   calcium  carbonate (TUMS - DOSED IN MG ELEMENTAL CALCIUM ) 500 MG chewable tablet Chew 1 tablet (200 mg of elemental calcium  total) by mouth 3 (three) times daily as needed for indigestion or heartburn. 30 tablet 0   Cholecalciferol (VITAMIN D3) 1000 UNITS CAPS Take 1,000  Units by mouth daily.     estradiol  (ESTRACE  VAGINAL) 0.1 MG/GM vaginal cream Place 1 Applicatorful vaginally at bedtime. 42.5 g 12   famotidine  (PEPCID ) 20 MG tablet Take 20 mg by mouth 2 (two) times daily.     valACYclovir  (VALTREX ) 1000 MG tablet TAKE 2 TABLETS(2000 MG) BY MOUTH TWICE DAILY 20 tablet 0   amLODipine  (NORVASC ) 5 MG tablet TAKE 1 AND 1/2 TABLETS(7.5 MG) BY MOUTH DAILY 135 tablet 2   buPROPion  ER (WELLBUTRIN  SR) 100 MG 12 hr tablet TAKE 1 TABLET(100 MG) BY MOUTH TWICE DAILY 180 tablet 1   losartan  (COZAAR ) 100 MG tablet TAKE 1 TABLET(100 MG) BY MOUTH DAILY 90 tablet 1   Facility-Administered Medications Prior to Visit  Medication Dose Route Frequency Provider Last Rate Last Admin   cyanocobalamin  ((VITAMIN B-12)) injection 1,000 mcg  1,000 mcg Intramuscular Once Chucky Homes L, MD        Review of Systems;  Patient denies headache, fevers, malaise, unintentional weight loss, skin rash, eye pain, sinus congestion and sinus pain, sore throat, dysphagia,  hemoptysis , cough, dyspnea, wheezing, chest pain, palpitations, orthopnea, edema, abdominal pain, nausea, melena, diarrhea, constipation, flank pain, dysuria, hematuria, urinary  Frequency, nocturia, numbness, tingling, seizures,  Focal weakness, Loss of consciousness,  Tremor, insomnia, depression, anxiety, and suicidal ideation.      Objective:  BP 130/68   Pulse 72   Ht 5' 2 (1.575 m)  Wt 153 lb 12.8 oz (69.8 kg)   SpO2 96%   BMI 28.13 kg/m   BP Readings from Last 3 Encounters:  01/07/24 130/68  09/10/23 126/76  05/28/23 112/62    Wt Readings from Last 3 Encounters:  01/07/24 153 lb 12.8 oz (69.8 kg)  12/23/23 151 lb 8 oz (68.7 kg)  09/10/23 153 lb 1.6 oz (69.4 kg)    Physical Exam  Lab Results  Component Value Date   HGBA1C 5.9 05/28/2023    Lab Results  Component Value Date   CREATININE 0.77 05/28/2023   CREATININE 0.77 11/22/2021   CREATININE 0.81 06/24/2020    Lab Results  Component  Value Date   WBC 7.9 05/28/2023   HGB 13.4 05/28/2023   HCT 40.5 05/28/2023   PLT 341.0 05/28/2023   GLUCOSE 91 05/28/2023   CHOL 157 05/28/2023   TRIG 100.0 05/28/2023   HDL 72.80 05/28/2023   LDLDIRECT 63.0 05/28/2023   LDLCALC 64 05/28/2023   ALT 16 05/28/2023   AST 17 05/28/2023   NA 135 05/28/2023   K 4.1 05/28/2023   CL 100 05/28/2023   CREATININE 0.77 05/28/2023   BUN 21 05/28/2023   CO2 28 05/28/2023   TSH 1.58 05/28/2023   HGBA1C 5.9 05/28/2023   MICROALBUR <0.7 05/28/2023    VAS US  LOWER EXTREMITY VENOUS REFLUX Result Date: 09/10/2023  Lower Venous Reflux Study Patient Name:  MALYA CIRILLO  Date of Exam:   09/10/2023 Medical Rec #: [id number]          Accession #:    [id number] Date of Birth: 01/05/1941           Patient Gender: F Patient Age:   89 years Exam Location:  [facility] Procedure:      VAS US  LOWER EXTREMITY VENOUS REFLUX Referring Phys: LUCIE RHYNE --------------------------------------------------------------------------------  Indications: Swelling, and varicosities. L>R  Risk Factors: Varicosities, Venous Insufficiency. Performing Technologist: Camellia Bolder RVT  Examination Guidelines: A complete evaluation includes B-mode imaging, spectral Doppler, color Doppler, and power Doppler as needed of all accessible portions of each vessel. Bilateral testing is considered an integral part of a complete examination. Limited examinations for reoccurring indications may be performed as noted. The reflux portion of the exam is performed with the patient in reverse Trendelenburg. Significant venous reflux is defined as >500 ms in the superficial venous system, and >1 second in the deep venous system.  +-------------------+---------+------+-----------+------------+--------+ LEFT               Reflux NoRefluxReflux TimeDiameter cmsComments                              Yes                                   +-------------------+---------+------+-----------+------------+--------+ CFV                          yes   >1 second                      +-------------------+---------+------+-----------+------------+--------+ GSV at SFJ                   yes    >500 ms      0.70             +-------------------+---------+------+-----------+------------+--------+  GSV prox thigh               yes    >500 ms      1.2              +-------------------+---------+------+-----------+------------+--------+ GSV mid thigh                yes    >500 ms      0.65             +-------------------+---------+------+-----------+------------+--------+ GSV dist thigh               yes    >500 ms      1.0              +-------------------+---------+------+-----------+------------+--------+ GSV at knee                  yes    >500 ms      0.60             +-------------------+---------+------+-----------+------------+--------+ GSV prox calf      no                            0.20             +-------------------+---------+------+-----------+------------+--------+ SSV Pop Fossa      no                            0.20             +-------------------+---------+------+-----------+------------+--------+ SSV prox calf      no                            0.25             +-------------------+---------+------+-----------+------------+--------+ SSV mid calf       no                            0.40             +-------------------+---------+------+-----------+------------+--------+ Medial/Lateral Calf          yes    >500 ms      1.0              +-------------------+---------+------+-----------+------------+--------+ Posterior Calf               yes    >500 ms      0.60             +-------------------+---------+------+-----------+------------+--------+  Summary: Left: - No evidence of deep vein thrombosis seen in the left lower extremity, from the common femoral through the  popliteal veins. - No evidence of superficial venous thrombosis in the left lower extremity. - Venous reflux is noted in the left common femoral vein. - Venous reflux is noted in the left sapheno-femoral junction. - Venous reflux is noted in the left greater saphenous vein in the thigh/knee and associated calf varicosities.  *See table(s) above for measurements and observations. Electronically signed by Lonni Gaskins MD on 09/10/2023 at 12:27:31 PM.    Final     Assessment & Plan:  .Essential hypertension, benign Assessment & Plan: Improved and at goal with  increased dose of amlodipine  to 7. 5mg  daily and  100 mg losartan  daily.    Orders: -     Comprehensive metabolic panel with GFR  Hyperlipidemia LDL goal <130 Assessment & Plan: Managed with atorvastatin  . Fasting lipids are at goal and LFTs are normal.  No changes today    Lab Results  Component Value Date   CHOL 157 05/28/2023   HDL 72.80 05/28/2023   LDLCALC 64 05/28/2023   LDLDIRECT 63.0 05/28/2023   TRIG 100.0 05/28/2023   CHOLHDL 2 05/28/2023   Lab Results  Component Value Date   ALT 16 05/28/2023   AST 17 05/28/2023   ALKPHOS 90 05/28/2023   BILITOT 0.5 05/28/2023     Orders: -     Lipid panel -     LDL cholesterol, direct  Malignant neoplasm of upper-outer quadrant of left breast in female, estrogen receptor positive (HCC) Assessment & Plan: Annual follow up with oncology  per note from Sept 2024:  Treatment plan: Adjuvant radiation therapy (patient does not want to do radiation) Followed by adjuvant antiestrogen therapy with anastrozole  1 mg daily started 06/21/2022   Anxiety as acute reaction to gross stress Assessment & Plan:  She has frequent insomnia managed with  prn alprazolam  aggravated by her recent diagnosis of breast cancer and her husband's history of disabling stroke. . The risks and benefits of benzodiazepine use were reviewed with patient today including excessive sedation leading to  respiratory depression,  impaired thinking/driving, and addiction.  Patient was advised to avoid concurrent use with alcohol, to use medication only as needed and not to share with others  .    Other orders -     amLODIPine  Besylate; TAKE 1 AND 1/2 TABLETS(7.5 MG) BY MOUTH DAILY  Dispense: 135 tablet; Refill: 2 -     buPROPion  HCl ER (SR); TAKE 1 TABLET(100 MG) BY MOUTH TWICE DAILY  Dispense: 180 tablet; Refill: 1 -     Losartan  Potassium; TAKE 1 TABLET(100 MG) BY MOUTH DAILY  Dispense: 90 tablet; Refill: 1     I spent 34 minutes on the day of this face to face encounter reviewing patient's  most recent visit with oncology,  prior relevant surgical and non surgical procedures, recent  labs and imaging studies, counseling on caregiver fatigue ,,  reviewing the assessment and plan with patient, and post visit ordering and reviewing of  diagnostics and therapeutics with patient  .   Follow-up: Return in about 6 months (around 07/08/2024).   Verneita LITTIE Kettering, MD

## 2024-01-07 NOTE — Assessment & Plan Note (Signed)
She has frequent insomnia managed with  prn alprazolam aggravated by her recent diagnosis of breast cancer and her husband's history of disabling stroke. . The risks and benefits of benzodiazepine use were reviewed with patient today including excessive sedation leading to respiratory depression,  impaired thinking/driving, and addiction.  Patient was advised to avoid concurrent use with alcohol, to use medication only as needed and not to share with others  .

## 2024-01-08 LAB — COMPREHENSIVE METABOLIC PANEL WITH GFR
ALT: 18 U/L (ref 0–35)
AST: 18 U/L (ref 0–37)
Albumin: 4.1 g/dL (ref 3.5–5.2)
Alkaline Phosphatase: 71 U/L (ref 39–117)
BUN: 19 mg/dL (ref 6–23)
CO2: 26 meq/L (ref 19–32)
Calcium: 9.6 mg/dL (ref 8.4–10.5)
Chloride: 101 meq/L (ref 96–112)
Creatinine, Ser: 0.79 mg/dL (ref 0.40–1.20)
GFR: 70.12 mL/min (ref >60.00)
Glucose, Bld: 92 mg/dL (ref 70–99)
Potassium: 3.9 meq/L (ref 3.5–5.1)
Sodium: 136 meq/L (ref 135–145)
Total Bilirubin: 0.5 mg/dL (ref 0.2–1.2)
Total Protein: 6.5 g/dL (ref 6.0–8.3)

## 2024-01-08 LAB — LIPID PANEL
Cholesterol: 155 mg/dL (ref 0–200)
HDL: 76 mg/dL (ref >39.00)
LDL Cholesterol: 65 mg/dL (ref 0–99)
NonHDL: 79.06
Total CHOL/HDL Ratio: 2
Triglycerides: 69 mg/dL (ref 0.0–149.0)
VLDL: 13.8 mg/dL (ref 0.0–40.0)

## 2024-01-08 LAB — LDL CHOLESTEROL, DIRECT: Direct LDL: 61 mg/dL

## 2024-01-09 ENCOUNTER — Ambulatory Visit: Payer: Self-pay | Admitting: Internal Medicine

## 2024-01-10 NOTE — Telephone Encounter (Signed)
 Copied from CRM [id number]. Topic: Clinical - Lab/Test Results >> Jan 10, 2024  9:09 AM Emylou G wrote: Reason for CRM: I relayed results to patient

## 2024-02-14 ENCOUNTER — Ambulatory Visit

## 2024-02-14 VITALS — Ht 62.0 in | Wt 150.0 lb

## 2024-02-14 DIAGNOSIS — Z0000 Encounter for general adult medical examination without abnormal findings: Secondary | ICD-10-CM | POA: Diagnosis not present

## 2024-02-14 NOTE — Progress Notes (Addendum)
 Subjective:   Kara Harvey is a 83 y.o. who presents for a Medicare Wellness preventive visit.  As a reminder, Annual Wellness Visits don't include a physical exam, and some assessments may be limited, especially if this visit is performed virtually. We may recommend an in-person follow-up visit with your provider if needed.  Visit Complete: Virtual I connected with  Kara Harvey on 02/14/24 by a audio enabled telemedicine application and verified that I am speaking with the correct person using two identifiers.  Patient Location: Home  Provider Location: Home Office  I discussed the limitations of evaluation and management by telemedicine. The patient expressed understanding and agreed to proceed.  Vital Signs: Because this visit was a virtual/telehealth visit, some criteria may be missing or patient reported. Any vitals not documented were not able to be obtained and vitals that have been documented are patient reported.  VideoDeclined- This patient declined Librarian, academic. Therefore the visit was completed with audio only.  Persons Participating in Visit: Patient.  AWV Questionnaire: No: Patient Medicare AWV questionnaire was not completed prior to this visit.  Cardiac Risk Factors include: advanced age (>47men, >64 women);dyslipidemia;hypertension     Objective:    Today's Vitals   02/14/24 1024  Weight: 150 lb (68 kg)  Height: 5' 2 (1.575 m)   Body mass index is 27.44 kg/m.     02/14/2024   10:41 AM 06/21/2022    2:56 PM 06/13/2022   11:36 AM 06/05/2022    2:39 PM 06/04/2022   11:13 AM 10/23/2021    2:09 PM 10/20/2020    1:42 PM  Advanced Directives  Does Patient Have a Medical Advance Directive? Yes Yes Yes Yes Yes Yes Yes  Type of Estate agent of [place];Living will Healthcare Power of [employer] of [place];Living will Healthcare Power of [place];Living will Healthcare Power of  [place];Living will Healthcare Power of [place];Living will Healthcare Power of [place];Living will  Does patient want to make changes to medical advance directive?  No - Patient declined No - Patient declined No - Patient declined No - Patient declined No - Patient declined No - Patient declined  Copy of Healthcare Power of Attorney in Chart? No - copy requested No - copy requested No - copy requested No - copy requested No - copy requested No - copy requested No - copy requested    Current Medications (verified) Outpatient Encounter Medications as of 02/14/2024  Medication Sig   ALPRAZolam  (XANAX ) 0.5 MG tablet Take 0.5 tablets (0.25 mg total) by mouth 2 (two) times daily as needed for anxiety.   amLODipine  (NORVASC ) 5 MG tablet TAKE 1 AND 1/2 TABLETS(7.5 MG) BY MOUTH DAILY   anastrozole  (ARIMIDEX ) 1 MG tablet TAKE 1 TABLET(1 MG) BY MOUTH DAILY   atorvastatin  (LIPITOR) 20 MG tablet TAKE 1 TABLET(20 MG) BY MOUTH DAILY   buPROPion  ER (WELLBUTRIN  SR) 100 MG 12 hr tablet TAKE 1 TABLET(100 MG) BY MOUTH TWICE DAILY   Cholecalciferol (VITAMIN D3) 1000 UNITS CAPS Take 1,000 Units by mouth daily.   estradiol  (ESTRACE  VAGINAL) 0.1 MG/GM vaginal cream Place 1 Applicatorful vaginally at bedtime.   famotidine  (PEPCID ) 20 MG tablet Take 20 mg by mouth 2 (two) times daily. (Patient taking differently: Take 20 mg by mouth 2 (two) times daily as needed.)   losartan  (COZAAR ) 100 MG tablet TAKE 1 TABLET(100 MG) BY MOUTH DAILY   valACYclovir  (VALTREX ) 1000 MG tablet TAKE 2 TABLETS(2000 MG) BY MOUTH TWICE DAILY  calcium  carbonate (TUMS - DOSED IN MG ELEMENTAL CALCIUM ) 500 MG chewable tablet Chew 1 tablet (200 mg of elemental calcium  total) by mouth 3 (three) times daily as needed for indigestion or heartburn. (Patient not taking: Reported on 02/14/2024)   Facility-Administered Encounter Medications as of 02/14/2024  Medication   cyanocobalamin  ((VITAMIN B-12)) injection 1,000 mcg    Allergies (verified) Sulfa  antibiotics   History: Past Medical History:  Diagnosis Date   Closed malleolar fracture, unspecified laterality, with routine healing, subsequent encounter 03/04/2014   occured in  July 2013   Fracture of finger, left, closed 04/28/2017   Humerus fracture 07/13/2017   Hyperlipidemia    Hypertension    Past Surgical History:  Procedure Laterality Date   BREAST BIOPSY Left 04/11/2022   x'2   BREAST BIOPSY  06/12/2022   MM LT RADIOACTIVE SEED LOC MAMMO GUIDE 06/12/2022 [facility]   BREAST EXCISIONAL BIOPSY Left 1988   benign   BREAST LUMPECTOMY WITH RADIOACTIVE SEED AND SENTINEL LYMPH NODE BIOPSY Left 06/13/2022   Procedure: LEFT BREAST LUMPECTOMY WITH RADIOACTIVE SEED AND SENTINEL LYMPH NODE BIOPSY;  Surgeon: Vanderbilt Ned, MD;  Location: [facility];  Service: General;  Laterality: Left;   BREAST SURGERY Left 1986   for false positive/biopsy   EYE SURGERY Left 6wks ago   cataract   FRACTURE SURGERY Left 01/14/2011   ankle   TONSILECTOMY, ADENOIDECTOMY, BILATERAL MYRINGOTOMY AND TUBES Bilateral 1957   does not believe adenoids were removed   TUBAL LIGATION  1985   Family History  Problem Relation Age of Onset   Cancer Mother        lung Cancer   Hyperlipidemia Mother    Arthritis Father    Alzheimer's disease Father    Colon cancer Paternal Aunt    Social History   Socioeconomic History   Marital status: Married    Spouse name: Not on file   Number of children: Not on file   Years of education: Not on file   Highest education level: Not on file  Occupational History   Not on file  Tobacco Use   Smoking status: Former    Current packs/day: 0.00    Types: Cigarettes    Quit date: 03/28/1979    Years since quitting: 44.9   Smokeless tobacco: Never  Vaping Use   Vaping status: Never Used  Substance and Sexual Activity   Alcohol use: Yes    Alcohol/week: 5.0 standard drinks of alcohol    Types: 5 Glasses of wine per week   Drug use: No    Sexual activity: Yes  Other Topics Concern   Not on file  Social History Narrative   married   Social Drivers of Corporate investment banker Strain: Low Risk  (02/14/2024)   Overall Financial Resource Strain (CARDIA)    Difficulty of Paying Living Expenses: Not hard at all  Food Insecurity: No Food Insecurity (02/14/2024)   Hunger Vital Sign    Worried About Running Out of Food in the Last Year: Never true    Ran Out of Food in the Last Year: Never true  Transportation Needs: No Transportation Needs (02/14/2024)   PRAPARE - Administrator, Civil Service (Medical): No    Lack of Transportation (Non-Medical): No  Physical Activity: Inactive (02/14/2024)   Exercise Vital Sign    Days of Exercise per Week: 0 days    Minutes of Exercise per Session: 0 min  Stress: No Stress Concern Present (  02/14/2024)   [employer] [employer] - Occupational Stress Questionnaire    Feeling of Stress: Only a little  Social Connections: Socially Integrated (02/14/2024)   Social Connection and Isolation Panel    Frequency of Communication with Friends and Family: More than three times a week    Frequency of Social Gatherings with Friends and Family: Once a week    Attends Religious Services: More than 4 times per year    Active Member of [employer] or Organizations: Yes    Attends Banker Meetings: Never    Marital Status: Married    Tobacco Counseling Counseling given: Not Answered    Clinical Intake:  Pre-visit preparation completed: Yes  Pain : No/denies pain     BMI - recorded: 27.44 Nutritional Status: BMI 25 -29 Overweight Nutritional Risks: None Diabetes: No  Lab Results  Component Value Date   HGBA1C 5.9 05/28/2023     How often do you need to have someone help you when you read instructions, pamphlets, or other written materials from your doctor or pharmacy?: 1 - Never  Interpreter Needed?: No  Information entered by :: R. Pakou Rainbow  LPN   Activities of Daily Living     02/14/2024   10:25 AM  In your present state of health, do you have any difficulty performing the following activities:  Hearing? 0  Vision? 0  Difficulty concentrating or making decisions? 0  Walking or climbing stairs? 0  Dressing or bathing? 0  Doing errands, shopping? 0  Preparing Food and eating ? N  Using the Toilet? N  In the past six months, have you accidently leaked urine? N  Do you have problems with loss of bowel control? N  Managing your Medications? N  Managing your Finances? N  Housekeeping or managing your Housekeeping? N    Patient Care Team: Marylynn Verneita CROME, MD as PCP - General (Internal Medicine) Odean Potts, MD as Consulting Physician (Hematology and Oncology) Vanderbilt Ned, MD as Consulting Physician (General Surgery)  I have updated your Care Teams any recent Medical Services you may have received from other providers in the past year.     Assessment:   This is a routine wellness examination for Kara Harvey.  Hearing/Vision screen Hearing Screening - Comments:: No  issues Vision Screening - Comments:: readers   Goals Addressed             This Visit's Progress    Patient Stated       Wants to continue to stay active       Depression Screen     02/14/2024   10:36 AM 01/07/2024    3:34 PM 05/28/2023    3:18 PM 05/29/2022    1:37 PM 11/22/2021    1:46 PM 10/23/2021    2:06 PM 10/20/2020    1:41 PM  PHQ 2/9 Scores  PHQ - 2 Score 0 0 0 0 0 0 0  PHQ- 9 Score 0    0  0    Fall Risk     02/14/2024   10:29 AM 01/07/2024    3:34 PM 05/28/2023    3:18 PM 05/29/2022    1:37 PM 11/22/2021    1:46 PM  Fall Risk   Falls in the past year? 0 0 0 0 0  Number falls in past yr: 0 0 0 0   Injury with Fall? 0 0 0 0 0  Risk for fall due to : No Fall Risks No Fall Risks No  Fall Risks No Fall Risks No Fall Risks  Follow up Falls evaluation completed;Falls prevention discussed Falls evaluation completed Falls evaluation  completed Falls evaluation completed  Falls evaluation completed      Data saved with a previous flowsheet row definition    MEDICARE RISK AT HOME:  Medicare Risk at Home Any stairs in or around the home?: No If so, are there any without handrails?: No Home free of loose throw rugs in walkways, pet beds, electrical cords, etc?: Yes Adequate lighting in your home to reduce risk of falls?: Yes Life alert?: No Use of a cane, walker or w/c?: No Grab bars in the bathroom?: Yes Shower chair or bench in shower?: Yes Elevated toilet seat or a handicapped toilet?: Yes  TIMED UP AND GO:  Was the test performed?  No  Cognitive Function: 6CIT completed        02/14/2024   10:53 AM 02/14/2024   10:41 AM 10/23/2021    2:19 PM 10/20/2019    2:11 PM  6CIT Screen  What Year? 0 points  0 points 0 points  What month? 0 points  0 points 0 points  What time? 0 points 0 points 0 points 0 points  Count back from 20 0 points 0 points 0 points 0 points  Months in reverse 0 points 0 points 0 points 0 points  Repeat phrase 0 points 0 points 0 points 0 points  Total Score 0 points  0 points 0 points    Immunizations Immunization History  Administered Date(s) Administered   Fluad Quad(high Dose 65+) 04/08/2019, 04/18/2020, 04/13/2022   Fluad Trivalent(High Dose 65+) 04/08/2023   Influenza Split 04/20/2014   Influenza, High Dose Seasonal PF 04/07/2018   Influenza,inj,Quad PF,6+ Mos 03/27/2013   Influenza-Unspecified 04/18/2015, 04/23/2016, 04/23/2017, 04/24/2017   Moderna Covid-19 Fall Seasonal Vaccine 42yrs & older 02/17/2023, 07/05/2023   Moderna Covid-19 Vaccine Bivalent Booster 23yrs & up 03/26/2021, 11/28/2021   PFIZER(Purple Top)SARS-COV-2 Vaccination 08/14/2019, 09/04/2019, 03/28/2020, 11/01/2020   PNEUMOCOCCAL CONJUGATE-20 09/17/2021   Pfizer(Comirnaty)Fall Seasonal Vaccine 12 years and older 04/15/2022   Pneumococcal Conjugate-13 09/03/2013   Pneumococcal Polysaccharide-23 03/27/2010,  04/26/2017   Tdap 09/14/2014   Zoster Recombinant(Shingrix) 02/14/2018, 04/25/2018   Zoster, Live 05/22/2013    Screening Tests Health Maintenance  Topic Date Due   Colonoscopy  06/09/2018   COVID-19 Vaccine (10 - Pfizer risk 2024-25 season) 01/03/2024   INFLUENZA VACCINE  02/14/2024   MAMMOGRAM  03/28/2024   DEXA SCAN  05/30/2024   DTaP/Tdap/Td (2 - Td or Tdap) 09/13/2024   Medicare Annual Wellness (AWV)  02/13/2025   Pneumococcal Vaccine: 50+ Years  Completed   Zoster Vaccines- Shingrix  Completed   Hepatitis B Vaccines  Aged Out   HPV VACCINES  Aged Out   Meningococcal B Vaccine  Aged Out    Health Maintenance  Health Maintenance Due  Topic Date Due   Colonoscopy  06/09/2018   COVID-19 Vaccine (10 - Pfizer risk 2024-25 season) 01/03/2024   INFLUENZA VACCINE  02/14/2024   Health Maintenance Items Addressed: Discussed the need to update flu and covid vaccines annually. Patient declines colonoscopy at her age.  Additional Screening:  Vision Screening: Recommended annual ophthalmology exams for early detection of glaucoma and other disorders of the eye. Up to date [facility] Would you like a referral to an eye doctor? No    Dental Screening: Recommended annual dental exams for proper oral hygiene  Community Resource Referral / Chronic Care Management: CRR required this visit?  No  CCM required this visit?  No   Plan:    I have personally reviewed and noted the following in the patient's chart:   Medical and social history Use of alcohol, tobacco or illicit drugs  Current medications and supplements including opioid prescriptions. Patient is not currently taking opioid prescriptions. Functional ability and status Nutritional status Physical activity Advanced directives List of other physicians Hospitalizations, surgeries, and ER visits in previous 12 months Vitals Screenings to include cognitive, depression, and falls Referrals and appointments  In  addition, I have reviewed and discussed with patient certain preventive protocols, quality metrics, and best practice recommendations. A written personalized care plan for preventive services as well as general preventive health recommendations were provided to patient.   Angeline Fredericks, LPN   07/23/7972   After Visit Summary: MyChart) Due to this being a telephonic visit, the after visit summary with patients personalized plan was offered to patient.  Patient can pick up at next office visit  Notes: Nothing significant to report at this time.

## 2024-02-14 NOTE — Patient Instructions (Signed)
 Kara Harvey , Thank you for taking time out of your busy schedule to complete your Annual Wellness Visit with me. I enjoyed our conversation and look forward to speaking with you again next year. I, as well as your care team,  appreciate your ongoing commitment to your health goals. Please review the following plan we discussed and let me know if I can assist you in the future. Your Game plan/ To Do List    Referrals: If you haven't heard from the office you've been referred to, please reach out to them at the phone provided.  Remember to update your flu and covid vaccines annually  Follow up Visits: We will see or speak with you next year for your Next Medicare AWV with our clinical staff 02/16/25 @ 11:30 Have you seen your provider in the last 6 months (3 months if uncontrolled diabetes)? Yes  Clinician Recommendations:  Aim for 30 minutes of exercise or brisk walking, 6-8 glasses of water, and 5 servings of fruits and vegetables each day.       This is a list of the screenings recommended for you:  Health Maintenance  Topic Date Due   Colon Cancer Screening  06/09/2018   COVID-19 Vaccine (10 - Pfizer risk 2024-25 season) 01/03/2024   Flu Shot  02/14/2024   Mammogram  03/28/2024   DEXA scan (bone density measurement)  05/30/2024   DTaP/Tdap/Td vaccine (2 - Td or Tdap) 09/13/2024   Medicare Annual Wellness Visit  02/13/2025   Pneumococcal Vaccine for age over 23  Completed   Zoster (Shingles) Vaccine  Completed   Hepatitis B Vaccine  Aged Out   HPV Vaccine  Aged Out   Meningitis B Vaccine  Aged Out    Advanced directives: (Copy Requested) Please bring a copy of your health care power of attorney and living will to the office to be added to your chart at your convenience. You can mail to [facility] [address] [place], [place] [address] or email to [email or web address] Advance Care Planning is important because it:  [x]  Makes sure you receive the medical  care that is consistent with your values, goals, and preferences  [x]  It provides guidance to your family and loved ones and reduces their decisional burden about whether or not they are making the right decisions based on your wishes.

## 2024-03-09 ENCOUNTER — Ambulatory Visit: Admitting: Dermatology

## 2024-03-09 DIAGNOSIS — D229 Melanocytic nevi, unspecified: Secondary | ICD-10-CM

## 2024-03-09 DIAGNOSIS — L910 Hypertrophic scar: Secondary | ICD-10-CM | POA: Diagnosis not present

## 2024-03-09 DIAGNOSIS — L814 Other melanin hyperpigmentation: Secondary | ICD-10-CM | POA: Diagnosis not present

## 2024-03-09 DIAGNOSIS — D1801 Hemangioma of skin and subcutaneous tissue: Secondary | ICD-10-CM

## 2024-03-09 DIAGNOSIS — D225 Melanocytic nevi of trunk: Secondary | ICD-10-CM

## 2024-03-09 DIAGNOSIS — D2262 Melanocytic nevi of left upper limb, including shoulder: Secondary | ICD-10-CM | POA: Diagnosis not present

## 2024-03-09 DIAGNOSIS — W908XXA Exposure to other nonionizing radiation, initial encounter: Secondary | ICD-10-CM | POA: Diagnosis not present

## 2024-03-09 DIAGNOSIS — L821 Other seborrheic keratosis: Secondary | ICD-10-CM | POA: Diagnosis not present

## 2024-03-09 DIAGNOSIS — Z1283 Encounter for screening for malignant neoplasm of skin: Secondary | ICD-10-CM

## 2024-03-09 DIAGNOSIS — I8393 Asymptomatic varicose veins of bilateral lower extremities: Secondary | ICD-10-CM | POA: Diagnosis not present

## 2024-03-09 DIAGNOSIS — D692 Other nonthrombocytopenic purpura: Secondary | ICD-10-CM

## 2024-03-09 DIAGNOSIS — L578 Other skin changes due to chronic exposure to nonionizing radiation: Secondary | ICD-10-CM

## 2024-03-09 DIAGNOSIS — D2261 Melanocytic nevi of right upper limb, including shoulder: Secondary | ICD-10-CM

## 2024-03-09 NOTE — Progress Notes (Signed)
 Follow-Up Visit   Subjective  Kara Harvey is a 83 y.o. female who presents for the following: Skin Cancer Screening and Full Body Skin Exam  The patient presents for Total-Body Skin Exam (TBSE) for skin cancer screening and mole check. The patient has spots, moles and lesions to be evaluated, some may be new or changing. She has a scaly spot on the lower back.    The following portions of the chart were reviewed this encounter and updated as appropriate: medications, allergies, medical history  Review of Systems:  No other skin or systemic complaints except as noted in HPI or Assessment and Plan.  Objective  Well appearing patient in no apparent distress; mood and affect are within normal limits.  A full examination was performed including scalp, head, eyes, ears, nose, lips, neck, chest, axillae, abdomen, back, buttocks, bilateral upper extremities, bilateral lower extremities, hands, feet, fingers, toes, fingernails, and toenails. All findings within normal limits unless otherwise noted below.   Relevant physical exam findings are noted in the Assessment and Plan.       Assessment & Plan   SKIN CANCER SCREENING PERFORMED TODAY.  ACTINIC DAMAGE - Chronic condition, secondary to cumulative UV/sun exposure - diffuse scaly erythematous macules with underlying dyspigmentation - Recommend daily broad spectrum sunscreen SPF 30+ to sun-exposed areas, reapply every 2 hours as needed.  - Staying in the shade or wearing long sleeves, sun glasses (UVA+UVB protection) and wide brim hats (4-inch brim around the entire circumference of the hat) are also recommended for sun protection.  - Call for new or changing lesions.  LENTIGINES, SEBORRHEIC KERATOSES, HEMANGIOMAS - Benign normal skin lesions - Benign-appearing - Call for any changes  MELANOCYTIC NEVI - Tan-brown and/or pink-flesh-colored symmetric macules and papules - left spinal upper back 3 mm two toned brown macule with  additional medium dark brown macules x 2 superior - new photo taken today. - Right upper arm below shoulder 2 mm med dark brown macules x 2 - left upper arm 4 mm flesh papule with darker center - Benign appearing on exam today - Observation - Call clinic for new or changing moles - Recommend daily use of broad spectrum spf 30+ sunscreen to sun-exposed areas.   HYPERTROPHIC SCAR Exam: Dyspigmented firm plaque at left breast. History of breast cancer.   Benign-appearing.  Call clinic for new or changing lesions. Discussed option of intralesional triamcinolone if this becomes bothersome.   Treatment Plan: Observe.  Purpura - Chronic; persistent and recurrent.  Treatable, but not curable. 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 - Smaller vessels can be treated by sclerotherapy (a procedure to inject a medicine into the veins to make them disappear) if desired, but the treatment is not covered by insurance. Larger vessels may be covered if symptomatic and we would refer to vascular surgeon if treatment desired.   Return in about 1 year (around 03/09/2025) for TBSE.  LILLETTE Andrea Kerns, CMA, am acting as scribe for Rexene Rattler, MD .   Documentation: I have reviewed the above documentation for accuracy and completeness, and I  agree with the above.  Rexene Rattler, MD

## 2024-03-09 NOTE — Patient Instructions (Signed)

## 2024-03-17 ENCOUNTER — Ambulatory Visit: Payer: Self-pay

## 2024-03-17 NOTE — Telephone Encounter (Signed)
 Pt is scheduled to see Leron Glance, NP tomorrow.

## 2024-03-17 NOTE — Telephone Encounter (Signed)
 FYI Only or Action Required?: FYI only for provider.  Patient was last seen in primary care on 01/07/2024 by Marylynn Verneita CROME, MD.  Called Nurse Triage reporting Dizziness.  Symptoms began yesterday.  Interventions attempted: Rest, hydration, or home remedies.  Symptoms are: stable.  Triage Disposition: See PCP When Office is Open (Within 3 Days)  Patient/caregiver understands and will follow disposition?: Yes     Copied from CRM [id number]. Topic: Clinical - Red Word Triage >> Mar 17, 2024  4:00 PM Rosina D wrote: Reason for RMF:[id number] since yesterday Reason for Disposition  [1] MODERATE dizziness (e.g., interferes with normal activities) AND [2] has been evaluated by doctor (or NP/PA) for this  Answer Assessment - Initial Assessment Questions Husband passed away recently, running on adrenaline and feels like everything is catching up with her. BP was high yesterday , 177 over something and came down when she calmed down. BP medication in morning and at night.  1. DESCRIPTION: Describe your dizziness.     Felt like was going to pass out, had to hold on to family members to leave the restaurant.   2. VERTIGO: Do you feel like either you or the room is spinning or tilting? (i.e., vertigo)     No  3. ONSET:  When did the dizziness begin?     Yes  4. AGGRAVATING FACTORS: Does anything make it worse? (e.g., standing, change in head position)     Doesn't notice anything  5. OTHER SYMPTOMS: Do you have any other symptoms? (e.g., fever, chest pain, vomiting, diarrhea, bleeding)       No  Protocols used: Dizziness - Lightheadedness-A-AH

## 2024-03-18 ENCOUNTER — Ambulatory Visit (INDEPENDENT_AMBULATORY_CARE_PROVIDER_SITE_OTHER): Admitting: Nurse Practitioner

## 2024-03-18 VITALS — BP 127/73 | HR 84 | Temp 98.4°F | Ht 62.0 in | Wt 153.8 lb

## 2024-03-18 DIAGNOSIS — F430 Acute stress reaction: Secondary | ICD-10-CM | POA: Diagnosis not present

## 2024-03-18 DIAGNOSIS — I10 Essential (primary) hypertension: Secondary | ICD-10-CM

## 2024-03-18 DIAGNOSIS — F411 Generalized anxiety disorder: Secondary | ICD-10-CM

## 2024-03-18 DIAGNOSIS — F4320 Adjustment disorder, unspecified: Secondary | ICD-10-CM

## 2024-03-18 NOTE — Progress Notes (Signed)
 Kara Glance, NP-C Phone: [phone]  Kara Harvey is a 83 y.o. female who presents today for lightheadedness.   Discussed the use of AI scribe software for clinical note transcription with the patient, who gave verbal consent to proceed.  History of Present Illness   Kara Harvey is an 83 year old female with hypertension who presents with lightheadedness following a recent bereavement.  She experienced an episode of lightheadedness two days ago while at a noisy restaurant. The sensation was unfamiliar, and she was concerned about the possibility of having a stroke. No room spinning, blurred vision, or sensation of spinning. Her sister-in-law, a Designer, jewellery, noted an irregular heartbeat and suggested she see a doctor. Since the episode, no shortness of breath, heart racing, or other symptoms.  She has a history of lightheadedness, which has been controlled since her blood pressure was regulated. She is currently taking amlodipine  in the morning and losartan  at night for blood pressure management. Her blood pressure was elevated at 177 after the episode, but she did not take additional medication at that time.  She has been experiencing significant grief and stress following the recent death of her husband on 08/17/25with his memorial service on August 29th. She believes this stress may have contributed to her symptoms. She also shared a past experience of losing a daughter at 22, which was a prolonged period of grief.  She has a history of breast cancer, for which Xanax  was initially prescribed to manage stress related to her husband's health condition. She rarely takes Xanax  due to its sedative effects.      Social History   Tobacco Use  Smoking Status Former   Current packs/day: 0.00   Types: Cigarettes   Quit date: 03/28/1979   Years since quitting: 45.0  Smokeless Tobacco Never    Current Outpatient Medications on File Prior to Visit  Medication Sig  Dispense Refill   amLODipine  (NORVASC ) 5 MG tablet TAKE 1 AND 1/2 TABLETS(7.5 MG) BY MOUTH DAILY 135 tablet 2   anastrozole  (ARIMIDEX ) 1 MG tablet TAKE 1 TABLET(1 MG) BY MOUTH DAILY 90 tablet 3   atorvastatin  (LIPITOR) 20 MG tablet TAKE 1 TABLET(20 MG) BY MOUTH DAILY 90 tablet 1   buPROPion  ER (WELLBUTRIN  SR) 100 MG 12 hr tablet TAKE 1 TABLET(100 MG) BY MOUTH TWICE DAILY 180 tablet 1   calcium  carbonate (TUMS - DOSED IN MG ELEMENTAL CALCIUM ) 500 MG chewable tablet Chew 1 tablet (200 mg of elemental calcium  total) by mouth 3 (three) times daily as needed for indigestion or heartburn. (Patient not taking: Reported on 02/14/2024) 30 tablet 0   Cholecalciferol (VITAMIN D3) 1000 UNITS CAPS Take 1,000 Units by mouth daily.     estradiol  (ESTRACE  VAGINAL) 0.1 MG/GM vaginal cream Place 1 Applicatorful vaginally at bedtime. 42.5 g 12   famotidine  (PEPCID ) 20 MG tablet Take 20 mg by mouth 2 (two) times daily. (Patient taking differently: Take 20 mg by mouth 2 (two) times daily as needed.)     losartan  (COZAAR ) 100 MG tablet TAKE 1 TABLET(100 MG) BY MOUTH DAILY 90 tablet 1   valACYclovir  (VALTREX ) 1000 MG tablet TAKE 2 TABLETS(2000 MG) BY MOUTH TWICE DAILY 20 tablet 0   Current Facility-Administered Medications on File Prior to Visit  Medication Dose Route Frequency Provider Last Rate Last Admin   cyanocobalamin  ((VITAMIN B-12)) injection 1,000 mcg  1,000 mcg Intramuscular Once [name], [name], MD         ROS see history of present  illness  Objective  Physical Exam Vitals:   03/18/24 1446  BP: 127/73  Pulse: 84  Temp: 98.4 F (36.9 C)  SpO2: 98%    BP Readings from Last 3 Encounters:  03/18/24 127/73  01/07/24 130/68  09/10/23 126/76   Wt Readings from Last 3 Encounters:  03/23/24 155 lb (70.3 kg)  03/18/24 153 lb 12.8 oz (69.8 kg)  02/14/24 150 lb (68 kg)    Physical Exam Constitutional:      General: She is not in acute distress.    Appearance: Normal appearance.  HENT:     Head:  Normocephalic.  Cardiovascular:     Rate and Rhythm: Normal rate and regular rhythm.     Heart sounds: Normal heart sounds.  Pulmonary:     Effort: Pulmonary effort is normal.     Breath sounds: Normal breath sounds.  Skin:    General: Skin is warm and dry.  Neurological:     General: No focal deficit present.     Mental Status: She is alert.  Psychiatric:        Mood and Affect: Mood normal.        Behavior: Behavior normal.      Assessment/Plan: Please see individual problem list.  Grief reaction Assessment & Plan: Lightheadedness is likely due to grief, anxiety, and stress, with no vertigo or neurological symptoms. A possible panic attack or adrenaline crash is considered. No significant arrhythmia noted on exam. Advise emergency care for headaches, worsening dizziness, speech difficulties, weakness, chest pain, or shortness of breath. Encourage Xanax  as needed for anxiety and practice deep breathing exercises.   Anxiety as acute reaction to gross stress Assessment & Plan: Lightheadedness is likely due to grief, anxiety, and stress, with no vertigo or neurological symptoms. A possible panic attack or adrenaline crash is considered. Encourage Xanax  as needed for anxiety and practice deep breathing exercises.   Essential hypertension, benign Assessment & Plan: Hypertension is well-managed with amlodipine  and losartan , with stable blood pressure readings. A recent spike is likely anxiety-related. Continue amlodipine  7.5 mg daily and losartan  100 mg daily.       Return if symptoms worsen or fail to improve.   Kara Glance, NP-C [facility]

## 2024-03-20 ENCOUNTER — Other Ambulatory Visit: Payer: Self-pay | Admitting: Internal Medicine

## 2024-03-20 MED ORDER — ALPRAZOLAM 0.5 MG PO TABS: 0.2500 mg | ORAL_TABLET | Freq: Two times a day (BID) | ORAL | 5 refills | Status: AC | PRN

## 2024-03-20 NOTE — Telephone Encounter (Unsigned)
 Copied from CRM [id number]. Topic: Clinical - Medication Refill >> Mar 20, 2024 12:01 PM Drema MATSU wrote: Medication: ALPRAZolam  (XANAX ) 0.5 MG tablet  Has the patient contacted their pharmacy? no (Agent: If no, request that the patient contact the pharmacy for the refill. If patient does not wish to contact the pharmacy document the reason why and proceed with request.) no more refills  (Agent: If yes, when and what did the pharmacy advise?)  This is the patient's preferred pharmacy:  [id number] - [place], [place] - [address] AT [facility] [address] [facility] [address] [place] [address] Phone: [phone] Fax: [phone]  Is this the correct pharmacy for this prescription? Yes If no, delete pharmacy and type the correct one.   Has the prescription been filled recently? No  Is the patient out of the medication? Yes  Has the patient been seen for an appointment in the last year OR does the patient have an upcoming appointment? Yes  Can we respond through MyChart? No  Agent: Please be advised that Rx refills may take up to 3 business days. We ask that you follow-up with your pharmacy.

## 2024-03-23 ENCOUNTER — Ambulatory Visit: Attending: Surgery

## 2024-03-23 VITALS — Wt 155.0 lb

## 2024-03-23 DIAGNOSIS — Z483 Aftercare following surgery for neoplasm: Secondary | ICD-10-CM | POA: Insufficient documentation

## 2024-03-23 NOTE — Therapy (Signed)
 OUTPATIENT PHYSICAL THERAPY SOZO SCREENING NOTE   Patient Name: Kara Harvey MRN: [id number] DOB:[birth year], 83 y.o., female Today's Date: 03/23/2024  PCP: Marylynn Verneita CROME, MD REFERRING PROVIDER: Vanderbilt Ned, MD   PT End of Session - 03/23/24 1627     Visit Number 2   # unchanged due to screen only   PT Start Time 1625    PT Stop Time 1629    PT Time Calculation (min) 4 min    Activity Tolerance Patient tolerated treatment well    Behavior During Therapy [facility] for tasks assessed/performed          Past Medical History:  Diagnosis Date   Closed malleolar fracture, unspecified laterality, with routine healing, subsequent encounter 03/04/2014   occured in  July 2013   Fracture of finger, left, closed 04/28/2017   Humerus fracture 07/13/2017   Hyperlipidemia    Hypertension    Past Surgical History:  Procedure Laterality Date   BREAST BIOPSY Left 04/11/2022   x'2   BREAST BIOPSY  06/12/2022   MM LT RADIOACTIVE SEED LOC MAMMO GUIDE 06/12/2022 [facility] M[facility]MO[facility]AP[facility]   BREAST EXCISIONAL BIOPSY Left 1988   benign   BREAST LUMPECTOMY WITH RADIOACTIVE SEED AND SENTINEL LYMPH NODE BIOPSY Left 06/13/2022   Procedure: LEFT BREAST LUMPECTOMY WITH RADIOACTIVE SEED AND SENTINEL LYMPH NODE BIOPSY;  Surgeon: Vanderbilt Ned, MD;  Location: [facility];  Service: General;  Laterality: Left;   BREAST SURGERY Left 1986   for false positive/biopsy   EYE SURGERY Left 6wks ago   cataract   FRACTURE SURGERY Left 01/14/2011   ankle   TONSILECTOMY, ADENOIDECTOMY, BILATERAL MYRINGOTOMY AND TUBES Bilateral 1957   does not believe adenoids were removed   TUBAL LIGATION  1985   Patient Active Problem List   Diagnosis Date Noted   Grief reaction 03/18/2024   Chronic venous insufficiency 09/10/2023   Vaginal atrophy 05/28/2023   COVID-19 04/19/2022   Malignant neoplasm of upper-outer quadrant of left breast in female, estrogen receptor positive (HCC) 04/16/2022   Encounter  for preventive health examination 09/01/2020   History of colonic polyps 06/24/2020   Anxiety as acute reaction to gross stress 12/20/2018   Vitamin D  deficiency 04/29/2016   Osteopenia 04/26/2016   Long-term use of high-risk medication 01/25/2016   Depressive disorder 01/25/2016   Cataract extraction status of left eye 03/04/2014   Overweight (BMI 25.0-29.9) 09/05/2013   Urge incontinence 03/27/2013   Essential hypertension, benign 03/27/2013   Hyperlipidemia LDL goal <130 03/27/2013    REFERRING DIAG: left breast cancer at risk for lymphedema  THERAPY DIAG: Aftercare following surgery for neoplasm  PERTINENT HISTORY: Patient was diagnosed on 04/11/2022 with left grade 2 Invasive Lobular Carcinoma. She had a left lumpectomy and sentinel node biopsy (0/4 positive) on 06/13/2022. It is ER +, PR negative, and Her 2 negative with a Ki67 of 15%. She is scheduled for left lumpectomy with SLNB on 06/13/2022   PRECAUTIONS: left UE Lymphedema risk, None  SUBJECTIVE: Pt returns for her last 3 month L-Dex screen.   PAIN:  Are you having pain? No  SOZO SCREENING: Patient was assessed today using the SOZO machine to determine the lymphedema index score. This was compared to her baseline score. It was determined that she is within the recommended range when compared to her baseline and no further action is needed at this time. She will continue SOZO screenings. These are done every 3 months for 2 years post operatively followed by every 6 months for  2 years, and then annually.   L-DEX FLOWSHEETS - 03/23/24 1600       L-DEX LYMPHEDEMA SCREENING   Measurement Type Unilateral    L-DEX MEASUREMENT EXTREMITY Upper Extremity    POSITION  Standing    DOMINANT SIDE Right    At Risk Side Left    BASELINE SCORE (UNILATERAL) -0.4    L-DEX SCORE (UNILATERAL) 0.4    VALUE CHANGE (UNILAT) 0.8          P: Transition to 6 month SOZO   Aden Berwyn Caldron, PTA 03/23/2024, 4:28 PM

## 2024-03-24 ENCOUNTER — Other Ambulatory Visit: Payer: Self-pay | Admitting: Hematology and Oncology

## 2024-03-24 DIAGNOSIS — Z853 Personal history of malignant neoplasm of breast: Secondary | ICD-10-CM

## 2024-03-24 DIAGNOSIS — Z9889 Other specified postprocedural states: Secondary | ICD-10-CM

## 2024-03-31 ENCOUNTER — Ambulatory Visit
Admission: RE | Admit: 2024-03-31 | Discharge: 2024-03-31 | Disposition: A | Discharge status: Home / Self Care | Source: Ambulatory Visit | Attending: Hematology and Oncology

## 2024-03-31 DIAGNOSIS — Z9889 Other specified postprocedural states: Secondary | ICD-10-CM

## 2024-03-31 DIAGNOSIS — R928 Other abnormal and inconclusive findings on diagnostic imaging of breast: Secondary | ICD-10-CM | POA: Diagnosis not present

## 2024-03-31 DIAGNOSIS — Z853 Personal history of malignant neoplasm of breast: Secondary | ICD-10-CM

## 2024-04-01 ENCOUNTER — Ambulatory Visit: Admitting: Vascular Surgery

## 2024-04-01 NOTE — Assessment & Plan Note (Signed)
 Lightheadedness is likely due to grief, anxiety, and stress, with no vertigo or neurological symptoms. A possible panic attack or adrenaline crash is considered. Encourage Xanax  as needed for anxiety and practice deep breathing exercises.

## 2024-04-01 NOTE — Assessment & Plan Note (Signed)
 Lightheadedness is likely due to grief, anxiety, and stress, with no vertigo or neurological symptoms. A possible panic attack or adrenaline crash is considered. No significant arrhythmia noted on exam. Advise emergency care for headaches, worsening dizziness, speech difficulties, weakness, chest pain, or shortness of breath. Encourage Xanax  as needed for anxiety and practice deep breathing exercises.

## 2024-04-01 NOTE — Assessment & Plan Note (Signed)
 Hypertension is well-managed with amlodipine  and losartan , with stable blood pressure readings. A recent spike is likely anxiety-related. Continue amlodipine  7.5 mg daily and losartan  100 mg daily.

## 2024-04-09 ENCOUNTER — Inpatient Hospital Stay: Payer: Medicare Other | Attending: Hematology and Oncology | Admitting: Hematology and Oncology

## 2024-04-09 VITALS — BP 142/63 | HR 74 | Temp 98.3°F | Resp 17 | Wt 154.3 lb

## 2024-04-09 DIAGNOSIS — Z170 Estrogen receptor positive status [ER+]: Secondary | ICD-10-CM | POA: Diagnosis not present

## 2024-04-09 DIAGNOSIS — C50412 Malignant neoplasm of upper-outer quadrant of left female breast: Secondary | ICD-10-CM | POA: Diagnosis not present

## 2024-04-09 DIAGNOSIS — Z79811 Long term (current) use of aromatase inhibitors: Secondary | ICD-10-CM | POA: Insufficient documentation

## 2024-04-09 DIAGNOSIS — Z1731 Human epidermal growth factor receptor 2 positive status: Secondary | ICD-10-CM | POA: Insufficient documentation

## 2024-04-09 MED ORDER — ANASTROZOLE 1 MG PO TABS: 1.0000 mg | ORAL_TABLET | Freq: Every day | ORAL | 3 refills | Status: DC

## 2024-04-09 NOTE — Progress Notes (Signed)
 Patient Care Team: Marylynn Verneita CROME, MD as PCP - General (Internal Medicine) Odean Potts, MD as Consulting Physician (Hematology and Oncology) Vanderbilt Ned, MD as Consulting Physician (General Surgery)  DIAGNOSIS:  Encounter Diagnosis  Name Primary?   Malignant neoplasm of upper-outer quadrant of left breast in female, estrogen receptor positive (HCC) Yes    SUMMARY OF ONCOLOGIC HISTORY: Oncology History  Malignant neoplasm of upper-outer quadrant of left breast in female, estrogen receptor positive (HCC)  04/11/2022 Initial Diagnosis   Remote history left lumpectomy, screening mammogram detected left breast distortion by ultrasound measured 1.7 cm 11:30 position and irregular hypoechoic mass 1.4 cm, axilla negative, biopsy revealed grade 2 invasive lobular cancer at the 11:30 position ER 60%, PR 0%, Ki-67 15%, HER2 2+ by IHC, FISH negative ratio 1.55.  Second mass 1:00: Benign   05/11/2022 Cancer Staging   Staging form: Breast, AJCC 8th Edition - Clinical: Stage IA (cT1c, cN0, cM0, G2, ER+, PR-, HER2-) - Signed by Odean Potts, MD on 05/11/2022 Stage prefix: Initial diagnosis Histologic grading system: 3 grade system   06/13/2022 Surgery   Left lumpectomy: Grade 2 ILC 2.1 cm, LCIS, margins negative, 0/4 lymph nodes negative, ER 60%, PR 0%, HER2 negative, Ki-67 15%    06/2022 -  Anti-estrogen oral therapy   1 mg Anastrozole      CHIEF COMPLIANT: Surveillance of breast cancer anastrozole  therapy  HISTORY OF PRESENT ILLNESS:   History of Present Illness Kara Harvey is an 83 year old female with breast cancer who presents for a follow-up visit.  She is currently on anastrozole  and experiences occasional hot flashes. She is concerned about potential bone deterioration and cognitive effects due to estrogen blocking. Her breast density remains moderately high (category C), but no masses are noted. She has a history of fibroadenomas, which have resolved. No breast pain is  present, and she appreciates the stability post-menopause, having gone through menopause at a young age (around 9).     ALLERGIES:  is allergic to sulfa antibiotics.  MEDICATIONS:  Current Outpatient Medications  Medication Sig Dispense Refill   ALPRAZolam  (XANAX ) 0.5 MG tablet Take 0.5 tablets (0.25 mg total) by mouth 2 (two) times daily as needed for anxiety. 30 tablet 5   amLODipine  (NORVASC ) 5 MG tablet TAKE 1 AND 1/2 TABLETS(7.5 MG) BY MOUTH DAILY 135 tablet 2   anastrozole  (ARIMIDEX ) 1 MG tablet Take 1 tablet (1 mg total) by mouth daily. 90 tablet 3   atorvastatin  (LIPITOR) 20 MG tablet TAKE 1 TABLET(20 MG) BY MOUTH DAILY 90 tablet 1   buPROPion  ER (WELLBUTRIN  SR) 100 MG 12 hr tablet TAKE 1 TABLET(100 MG) BY MOUTH TWICE DAILY 180 tablet 1   calcium  carbonate (TUMS - DOSED IN MG ELEMENTAL CALCIUM ) 500 MG chewable tablet Chew 1 tablet (200 mg of elemental calcium  total) by mouth 3 (three) times daily as needed for indigestion or heartburn. (Patient not taking: Reported on 02/14/2024) 30 tablet 0   Cholecalciferol (VITAMIN D3) 1000 UNITS CAPS Take 1,000 Units by mouth daily.     estradiol  (ESTRACE  VAGINAL) 0.1 MG/GM vaginal cream Place 1 Applicatorful vaginally at bedtime. 42.5 g 12   famotidine  (PEPCID ) 20 MG tablet Take 20 mg by mouth 2 (two) times daily. (Patient taking differently: Take 20 mg by mouth 2 (two) times daily as needed.)     losartan  (COZAAR ) 100 MG tablet TAKE 1 TABLET(100 MG) BY MOUTH DAILY 90 tablet 1   valACYclovir  (VALTREX ) 1000 MG tablet TAKE 2 TABLETS(2000 MG) BY  MOUTH TWICE DAILY 20 tablet 0   Current Facility-Administered Medications  Medication Dose Route Frequency Provider Last Rate Last Admin   cyanocobalamin  ((VITAMIN B-12)) injection 1,000 mcg  1,000 mcg Intramuscular Once Marylynn Verneita CROME, MD        PHYSICAL EXAMINATION: ECOG PERFORMANCE STATUS: 1 - Symptomatic but completely ambulatory  Vitals:   04/09/24 1045  BP: (!) 142/63  Pulse: 74  Resp: 17   Temp: 98.3 F (36.8 C)  SpO2: 97%   Filed Weights   04/09/24 1045  Weight: 154 lb 4.8 oz (70 kg)     LABORATORY DATA:  I have reviewed the data as listed    Latest Ref Rng & Units 01/07/2024    4:07 PM 05/28/2023    4:02 PM 11/22/2021    2:23 PM  CMP  Glucose 70 - 99 mg/dL 92  91  91   BUN 6 - 23 mg/dL 19  21  17    Creatinine 0.40 - 1.20 mg/dL 9.20  9.22  9.22   Sodium 135 - 145 mEq/L 136  135  137   Potassium 3.5 - 5.1 mEq/L 3.9  4.1  4.0   Chloride 96 - 112 mEq/L 101  100  102   CO2 19 - 32 mEq/L 26  28  25    Calcium  8.4 - 10.5 mg/dL 9.6  9.4  9.4   Total Protein 6.0 - 8.3 g/dL 6.5  6.9  6.5   Total Bilirubin 0.2 - 1.2 mg/dL 0.5  0.5  0.5   Alkaline Phos 39 - 117 U/L 71  90  77   AST 0 - 37 U/L 18  17  13    ALT 0 - 35 U/L 18  16  13      Lab Results  Component Value Date   WBC 7.9 05/28/2023   HGB 13.4 05/28/2023   HCT 40.5 05/28/2023   MCV 92.6 05/28/2023   PLT 341.0 05/28/2023   NEUTROABS 4.7 05/28/2023    ASSESSMENT & PLAN:  Malignant neoplasm of upper-outer quadrant of left breast in female, estrogen receptor positive (HCC) 06/13/2022: Left lumpectomy: Grade 2 ILC 2.1 cm, LCIS, margins negative, 0/4 lymph nodes negative, ER 60%, PR 0%, HER2 negative, Ki-67 15%   Treatment plan: Adjuvant radiation therapy (patient does not want to do radiation) Followed by adjuvant antiestrogen therapy with anastrozole  1 mg daily started 06/21/2022  We did not perform Oncotype because of her age and lobular nature of the breast cancer.    Anastrozole  toxicities: Tolerating it extremely well without any problems.   Vaginal dryness: Much improved with Estrace .  She will use that twice a week.   Breast cancer surveillance:  Mammogram 03/31/2024: Benign breast density category C  Return to clinic in 1 year for follow-up   Assessment & Plan Estrogen receptor positive breast cancer, status post treatment, on adjuvant endocrine therapy Two years post-treatment on anastrozole .  Mammogram showed no masses, moderately dense breast tissue. Anastrozole  well-tolerated with occasional hot flashes, bone health and cognitive concerns unsubstantiated. - Continue anastrozole  1 mg oral daily. - Continue annual mammograms, transition to screening mammograms with 3D imaging.  Menopausal symptoms related to endocrine therapy Occasional hot flashes from anastrozole , manageable, not significantly impacting quality of life.  Postmenopausal atrophic vaginitis Condition improving with estradiol  vaginal cream, effective without overuse despite cost concerns. - Continue estradiol  (Estrace  vaginal) 0.1 mg/gm at bedtime, 2-3 times a week as tolerated. - Pharmacy to contact provider for refills as needed.  No orders of the defined types were placed in this encounter.  The patient has a good understanding of the overall plan. she agrees with it. she will call with any problems that may develop before the next visit here. Total time spent: 30 mins including face to face time and time spent for planning, charting and co-ordination of care   Viinay K Chidiebere Wynn, MD 04/09/24

## 2024-04-09 NOTE — Assessment & Plan Note (Signed)
 06/13/2022: Left lumpectomy: Grade 2 ILC 2.1 cm, LCIS, margins negative, 0/4 lymph nodes negative, ER 60%, PR 0%, HER2 negative, Ki-67 15%   Treatment plan: Adjuvant radiation therapy (patient does not want to do radiation) Followed by adjuvant antiestrogen therapy with anastrozole  1 mg daily started 06/21/2022  We did not perform Oncotype because of her age and lobular nature of the breast cancer.    Anastrozole  toxicities: Tolerating it extremely well without any problems.   Vaginal dryness: I encouraged her to use coconut oil and also sent a prescription for Estrace .  She will use that twice a week.   Breast cancer surveillance:  Mammogram 03/31/2024: Benign breast density category C Breast exam 04/09/2024: Benign  Return to clinic in 1 year for follow-up

## 2024-04-23 ENCOUNTER — Ambulatory Visit (INDEPENDENT_AMBULATORY_CARE_PROVIDER_SITE_OTHER)

## 2024-04-23 DIAGNOSIS — Z23 Encounter for immunization: Secondary | ICD-10-CM | POA: Diagnosis not present

## 2024-05-16 ENCOUNTER — Other Ambulatory Visit: Payer: Self-pay | Admitting: Hematology and Oncology

## 2024-05-27 ENCOUNTER — Ambulatory Visit: Admitting: Vascular Surgery

## 2024-06-07 ENCOUNTER — Other Ambulatory Visit: Payer: Self-pay | Admitting: Internal Medicine

## 2024-07-07 ENCOUNTER — Other Ambulatory Visit: Payer: Self-pay | Admitting: Internal Medicine

## 2024-07-10 ENCOUNTER — Telehealth: Payer: Self-pay

## 2024-07-10 NOTE — Telephone Encounter (Signed)
 I left voicemail for patient on her cell phone voicemail, asking her to please call us  back.  I was unable to leave a message on patient's home answering machine.  Patient does not have MyChart.  E2C2 - when patient calls back, please ask her if she would like to change her appointment on 07/13/2024 from a regular office visit to a physical, since she is due for her physical.  If patient has a specific issue she would like to discuss with Dr. Verneita Kettering that would not be included in her physical, then we need to keep this appointment as a regular office visit.

## 2024-07-13 ENCOUNTER — Ambulatory Visit: Admitting: Internal Medicine

## 2024-07-13 ENCOUNTER — Encounter: Payer: Self-pay | Admitting: Internal Medicine

## 2024-07-13 VITALS — BP 136/68 | HR 81 | Ht 62.0 in | Wt 155.4 lb

## 2024-07-13 DIAGNOSIS — F411 Generalized anxiety disorder: Secondary | ICD-10-CM | POA: Diagnosis not present

## 2024-07-13 DIAGNOSIS — Z860100 Personal history of colon polyps, unspecified: Secondary | ICD-10-CM

## 2024-07-13 DIAGNOSIS — F430 Acute stress reaction: Secondary | ICD-10-CM

## 2024-07-13 DIAGNOSIS — E663 Overweight: Secondary | ICD-10-CM

## 2024-07-13 DIAGNOSIS — Z853 Personal history of malignant neoplasm of breast: Secondary | ICD-10-CM | POA: Diagnosis not present

## 2024-07-13 DIAGNOSIS — Z79899 Other long term (current) drug therapy: Secondary | ICD-10-CM | POA: Diagnosis not present

## 2024-07-13 DIAGNOSIS — E785 Hyperlipidemia, unspecified: Secondary | ICD-10-CM

## 2024-07-13 DIAGNOSIS — I10 Essential (primary) hypertension: Secondary | ICD-10-CM | POA: Diagnosis not present

## 2024-07-13 DIAGNOSIS — N952 Postmenopausal atrophic vaginitis: Secondary | ICD-10-CM

## 2024-07-13 DIAGNOSIS — M85852 Other specified disorders of bone density and structure, left thigh: Secondary | ICD-10-CM

## 2024-07-13 MED ORDER — TRAZODONE HCL 50 MG PO TABS: 25.0000 mg | ORAL_TABLET | Freq: Every evening | ORAL | 3 refills | Status: AC | PRN

## 2024-07-13 MED ORDER — BUPROPION HCL ER (SR) 100 MG PO TB12: ORAL_TABLET | ORAL | 1 refills | Status: AC

## 2024-07-13 NOTE — Patient Instructions (Addendum)
 I am recommending adding trazodone at bedtime,  starting at 25 mg dose right at bedtime.  The dose may be advanced  every 3 or 4 days  as needed to maximum dose of 100 mg nightly    You may still use the alprazolam  if needed to fall asleep.  The trazodone will kick in  a few hours later (when you typically wake up)  Your bone density test  been ordered. To follow up on your osteopenia (thinning bones).    Please call to make your appointment at [facility]  [phone]

## 2024-07-13 NOTE — Progress Notes (Unsigned)
 "  Subjective:  Patient ID: Kara Harvey, female    DOB: 03-10-1941  Age: 83 y.o. MRN: [id number]  CC: There were no encounter diagnoses.   HPI Kara Harvey presents for No chief complaint on file.  1) Grief/GAD:  lost her husband Charlie,  in August .  In December she had to place  her son , [name], in a rehab facility due to progression of  leg weakness  resulting in a fall .  Son has been at  [facility]  for the past 23 days and is not likely going to be discharged   she is visitng him daily  because he has been delirious, delusional and hallucinating.    Spent Christmas with friends. Recovered more quickly than when her daughter died ,    2) h/o left breast ca diagnosed sept 2023 treated with lumpectomy .   No recurrence by Sept 2025 mammogram.  Taking anastrazole started in Dec 2023   3) Hypertension: patient checks blood pressure twice weekly at home.  Readings have been for the most part <130/80 at rest . Patient is following a reduced salt diet most days and is taking medications as prescribed   Outpatient Medications Prior to Visit  Medication Sig Dispense Refill   ALPRAZolam  (XANAX ) 0.5 MG tablet Take 0.5 tablets (0.25 mg total) by mouth 2 (two) times daily as needed for anxiety. 30 tablet 5   amLODipine  (NORVASC ) 5 MG tablet TAKE 1 AND 1/2 TABLETS(7.5 MG) BY MOUTH DAILY 135 tablet 2   anastrozole  (ARIMIDEX ) 1 MG tablet Take 1 tablet (1 mg total) by mouth daily. 90 tablet 3   atorvastatin  (LIPITOR) 20 MG tablet TAKE 1 TABLET(20 MG) BY MOUTH DAILY 90 tablet 1   buPROPion  ER (WELLBUTRIN  SR) 100 MG 12 hr tablet TAKE 1 TABLET(100 MG) BY MOUTH TWICE DAILY 180 tablet 1   calcium  carbonate (TUMS - DOSED IN MG ELEMENTAL CALCIUM ) 500 MG chewable tablet Chew 1 tablet (200 mg of elemental calcium  total) by mouth 3 (three) times daily as needed for indigestion or heartburn. (Patient not taking: Reported on 02/14/2024) 30 tablet 0   Cholecalciferol (VITAMIN D3) 1000 UNITS CAPS Take  1,000 Units by mouth daily.     estradiol  (ESTRACE ) 0.01 % CREA vaginal cream INSERT 1 APPLICATORFUL VAGINALLY AT BEDTIME 42.5 g 12   famotidine  (PEPCID ) 20 MG tablet Take 20 mg by mouth 2 (two) times daily. (Patient taking differently: Take 20 mg by mouth 2 (two) times daily as needed.)     losartan  (COZAAR ) 100 MG tablet TAKE 1 TABLET(100 MG) BY MOUTH DAILY 90 tablet 3   valACYclovir  (VALTREX ) 1000 MG tablet TAKE 2 TABLETS(2000 MG) BY MOUTH TWICE DAILY 20 tablet 0   Facility-Administered Medications Prior to Visit  Medication Dose Route Frequency Provider Last Rate Last Admin   cyanocobalamin  ((VITAMIN B-12)) injection 1,000 mcg  1,000 mcg Intramuscular Once Gunner Iodice L, MD        Review of Systems;  Patient denies headache, fevers, malaise, unintentional weight loss, skin rash, eye pain, sinus congestion and sinus pain, sore throat, dysphagia,  hemoptysis , cough, dyspnea, wheezing, chest pain, palpitations, orthopnea, edema, abdominal pain, nausea, melena, diarrhea, constipation, flank pain, dysuria, hematuria, urinary  Frequency, nocturia, numbness, tingling, seizures,  Focal weakness, Loss of consciousness,  Tremor, insomnia, depression, anxiety, and suicidal ideation.      Objective:  There were no vitals taken for this visit.  BP Readings from Last 3 Encounters:  04/09/24 ROLLEN)  142/63  03/18/24 127/73  01/07/24 130/68    Wt Readings from Last 3 Encounters:  04/09/24 154 lb 4.8 oz (70 kg)  03/23/24 155 lb (70.3 kg)  03/18/24 153 lb 12.8 oz (69.8 kg)    Physical Exam  Lab Results  Component Value Date   HGBA1C 5.9 05/28/2023    Lab Results  Component Value Date   CREATININE 0.79 01/07/2024   CREATININE 0.77 05/28/2023   CREATININE 0.77 11/22/2021    Lab Results  Component Value Date   WBC 7.9 05/28/2023   HGB 13.4 05/28/2023   HCT 40.5 05/28/2023   PLT 341.0 05/28/2023   GLUCOSE 92 01/07/2024   CHOL 155 01/07/2024   TRIG 69.0 01/07/2024   HDL 76.00  01/07/2024   LDLDIRECT 61.0 01/07/2024   LDLCALC 65 01/07/2024   ALT 18 01/07/2024   AST 18 01/07/2024   NA 136 01/07/2024   K 3.9 01/07/2024   CL 101 01/07/2024   CREATININE 0.79 01/07/2024   BUN 19 01/07/2024   CO2 26 01/07/2024   TSH 1.58 05/28/2023   HGBA1C 5.9 05/28/2023    MM 3D DIAGNOSTIC MAMMOGRAM BILATERAL BREAST Result Date: 03/31/2024 CLINICAL DATA:  Patient presents for bilateral diagnostic examination due to history of previous left malignant lumpectomy 2023. EXAM: DIGITAL DIAGNOSTIC BILATERAL MAMMOGRAM WITH TOMOSYNTHESIS AND CAD TECHNIQUE: Bilateral digital diagnostic mammography and breast tomosynthesis was performed. The images were evaluated with computer-aided detection. COMPARISON:  Previous exam(s). ACR Breast Density Category c: The breasts are heterogeneously dense, which may obscure small masses. FINDINGS: Exam demonstrates stable post lumpectomy changes over the inner upper left breast. Remainder of the left breast is unchanged. Right breast is unchanged. IMPRESSION: Stable post lumpectomy changes of the left breast. Stable right breast. RECOMMENDATION: Recommend continued annual bilateral screening mammographic follow-up. I have discussed the findings and recommendations with the patient. If applicable, a reminder letter will be sent to the patient regarding the next appointment. BI-RADS CATEGORY  2: Benign. Electronically Signed   By: Toribio Agreste M.D.   On: 03/31/2024 12:41    Assessment & Plan:  .There are no diagnoses linked to this encounter.   I spent 34 minutes on the day of this face to face encounter reviewing patient's  most recent visit with cardiology,  nephrology,  and neurology,  prior relevant surgical and non surgical procedures, recent  labs and imaging studies, counseling on weight management,  reviewing the assessment and plan with patient, and post visit ordering and reviewing of  diagnostics and therapeutics with patient  .   Follow-up: No  follow-ups on file.   Verneita LITTIE Kettering, MD "

## 2024-07-14 ENCOUNTER — Ambulatory Visit: Payer: Self-pay | Admitting: Internal Medicine

## 2024-07-14 DIAGNOSIS — Z853 Personal history of malignant neoplasm of breast: Secondary | ICD-10-CM | POA: Insufficient documentation

## 2024-07-14 DIAGNOSIS — E663 Overweight: Secondary | ICD-10-CM

## 2024-07-14 DIAGNOSIS — I10 Essential (primary) hypertension: Secondary | ICD-10-CM

## 2024-07-14 DIAGNOSIS — Z79899 Other long term (current) drug therapy: Secondary | ICD-10-CM

## 2024-07-14 LAB — CBC WITH DIFFERENTIAL/PLATELET
Basophils Absolute: 0 K/uL (ref 0.0–0.1)
Basophils Relative: 0.5 % (ref 0.0–3.0)
Eosinophils Absolute: 0.2 K/uL (ref 0.0–0.7)
Eosinophils Relative: 3.1 % (ref 0.0–5.0)
HCT: 38.1 % (ref 36.0–46.0)
Hemoglobin: 12.9 g/dL (ref 12.0–15.0)
Lymphocytes Relative: 29.1 % (ref 12.0–46.0)
Lymphs Abs: 2.1 K/uL (ref 0.7–4.0)
MCHC: 33.8 g/dL (ref 30.0–36.0)
MCV: 89.9 fl (ref 78.0–100.0)
Monocytes Absolute: 0.7 K/uL (ref 0.1–1.0)
Monocytes Relative: 9.4 % (ref 3.0–12.0)
Neutro Abs: 4.1 K/uL (ref 1.4–7.7)
Neutrophils Relative %: 57.9 % (ref 43.0–77.0)
Platelets: 282 K/uL (ref 150.0–400.0)
RBC: 4.24 Mil/uL (ref 3.87–5.11)
RDW: 13.4 % (ref 11.5–15.5)
WBC: 7.1 K/uL (ref 4.0–10.5)

## 2024-07-14 LAB — LIPID PANEL
Cholesterol: 158 mg/dL (ref 28–200)
HDL: 76 mg/dL
LDL Cholesterol: 70 mg/dL (ref 10–99)
NonHDL: 81.87
Total CHOL/HDL Ratio: 2
Triglycerides: 59 mg/dL (ref 10.0–149.0)
VLDL: 11.8 mg/dL (ref 0.0–40.0)

## 2024-07-14 LAB — COMPREHENSIVE METABOLIC PANEL WITH GFR
ALT: 13 U/L (ref 3–35)
AST: 15 U/L (ref 5–37)
Albumin: 4.2 g/dL (ref 3.5–5.2)
Alkaline Phosphatase: 77 U/L (ref 39–117)
BUN: 25 mg/dL — ABNORMAL HIGH (ref 6–23)
CO2: 28 meq/L (ref 19–32)
Calcium: 9.3 mg/dL (ref 8.4–10.5)
Chloride: 101 meq/L (ref 96–112)
Creatinine, Ser: 0.79 mg/dL (ref 0.40–1.20)
GFR: 69.87 mL/min
Glucose, Bld: 99 mg/dL (ref 70–99)
Potassium: 4 meq/L (ref 3.5–5.1)
Sodium: 137 meq/L (ref 135–145)
Total Bilirubin: 0.4 mg/dL (ref 0.2–1.2)
Total Protein: 6.4 g/dL (ref 6.0–8.3)

## 2024-07-14 LAB — TSH: TSH: 1.36 u[IU]/mL (ref 0.35–5.50)

## 2024-07-14 LAB — HEMOGLOBIN A1C: Hgb A1c MFr Bld: 6 % (ref 4.6–6.5)

## 2024-07-14 LAB — MICROALBUMIN / CREATININE URINE RATIO
Creatinine,U: 70.1 mg/dL
Microalb Creat Ratio: UNDETERMINED mg/g (ref 0.0–30.0)
Microalb, Ur: <0.7 mg/dL

## 2024-07-14 LAB — LDL CHOLESTEROL, DIRECT: Direct LDL: 66 mg/dL

## 2024-07-14 NOTE — Assessment & Plan Note (Signed)
2014.  Incomplete converted to virtual by [employer].  She has decided to continue to defer  in light of her age and  current diagnosis of breast cancer

## 2024-07-14 NOTE — Assessment & Plan Note (Addendum)
 Still using estrace  topically  per oncology approval

## 2024-07-14 NOTE — Assessment & Plan Note (Addendum)
 Diagnosed in 2023 and treated with lumpectomy.  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    Anastrozole  toxicities: Tolerating it extremely well without any problems.

## 2024-07-14 NOTE — Assessment & Plan Note (Addendum)
"   Manifested as frequent insomnia managed with use of low dose alprazolam  , aggravated by her recent diagnosis of breast cancer, husband's death  and son's institutionalization .   "

## 2024-07-15 NOTE — Addendum Note (Signed)
 Addended by: ORLANDO KINGDOM on: 07/15/2024 08:15 AM   Modules accepted: Orders

## 2024-08-11 ENCOUNTER — Other Ambulatory Visit: Payer: Self-pay | Admitting: Hematology and Oncology

## 2024-09-02 ENCOUNTER — Ambulatory Visit: Admitting: Vascular Surgery

## 2024-09-21 ENCOUNTER — Ambulatory Visit

## 2025-01-08 ENCOUNTER — Other Ambulatory Visit

## 2025-01-12 ENCOUNTER — Ambulatory Visit: Admitting: Internal Medicine

## 2025-02-16 ENCOUNTER — Ambulatory Visit

## 2025-03-09 ENCOUNTER — Encounter: Admitting: Dermatology

## 2025-04-12 ENCOUNTER — Ambulatory Visit: Admitting: Hematology and Oncology
